# Patient Record
Sex: Female | Born: 1965 | Race: White | Hispanic: No | Marital: Married | Smoking: Never smoker
Health system: Southern US, Community
[De-identification: ages and names within clinical notes are randomized; demographics above are authoritative.]

## PROBLEM LIST (undated history)

## (undated) DIAGNOSIS — E039 Hypothyroidism, unspecified: Secondary | ICD-10-CM

## (undated) DIAGNOSIS — J189 Pneumonia, unspecified organism: Secondary | ICD-10-CM

## (undated) DIAGNOSIS — I471 Supraventricular tachycardia, unspecified: Secondary | ICD-10-CM

## (undated) DIAGNOSIS — E079 Disorder of thyroid, unspecified: Secondary | ICD-10-CM

## (undated) DIAGNOSIS — T8859XA Other complications of anesthesia, initial encounter: Secondary | ICD-10-CM

## (undated) DIAGNOSIS — R000 Tachycardia, unspecified: Secondary | ICD-10-CM

## (undated) HISTORY — DX: Disorder of thyroid, unspecified: E07.9

## (undated) HISTORY — DX: Tachycardia, unspecified: R00.0

---

## 2014-06-17 ENCOUNTER — Encounter: Payer: Self-pay | Admitting: Family Medicine

## 2015-08-23 HISTORY — PX: BUNIONECTOMY: SHX129

## 2017-08-22 HISTORY — PX: VULVA SURGERY: SHX837

## 2018-09-06 DIAGNOSIS — L700 Acne vulgaris: Secondary | ICD-10-CM | POA: Diagnosis not present

## 2018-09-14 DIAGNOSIS — T3695XA Adverse effect of unspecified systemic antibiotic, initial encounter: Secondary | ICD-10-CM | POA: Diagnosis not present

## 2018-09-16 DIAGNOSIS — L270 Generalized skin eruption due to drugs and medicaments taken internally: Secondary | ICD-10-CM | POA: Diagnosis not present

## 2018-09-16 DIAGNOSIS — T360X5A Adverse effect of penicillins, initial encounter: Secondary | ICD-10-CM | POA: Diagnosis not present

## 2018-09-16 DIAGNOSIS — R5383 Other fatigue: Secondary | ICD-10-CM | POA: Diagnosis not present

## 2018-09-16 DIAGNOSIS — R110 Nausea: Secondary | ICD-10-CM | POA: Diagnosis not present

## 2018-11-01 DIAGNOSIS — Z01419 Encounter for gynecological examination (general) (routine) without abnormal findings: Secondary | ICD-10-CM | POA: Diagnosis not present

## 2018-11-01 DIAGNOSIS — N941 Unspecified dyspareunia: Secondary | ICD-10-CM | POA: Diagnosis not present

## 2018-11-01 DIAGNOSIS — N951 Menopausal and female climacteric states: Secondary | ICD-10-CM | POA: Diagnosis not present

## 2018-11-01 DIAGNOSIS — Z6824 Body mass index (BMI) 24.0-24.9, adult: Secondary | ICD-10-CM | POA: Diagnosis not present

## 2018-11-01 DIAGNOSIS — Z1231 Encounter for screening mammogram for malignant neoplasm of breast: Secondary | ICD-10-CM | POA: Diagnosis not present

## 2019-01-07 DIAGNOSIS — Q141 Congenital malformation of retina: Secondary | ICD-10-CM | POA: Diagnosis not present

## 2019-01-07 DIAGNOSIS — H2513 Age-related nuclear cataract, bilateral: Secondary | ICD-10-CM | POA: Diagnosis not present

## 2019-01-07 DIAGNOSIS — H43811 Vitreous degeneration, right eye: Secondary | ICD-10-CM | POA: Diagnosis not present

## 2019-01-07 DIAGNOSIS — H43391 Other vitreous opacities, right eye: Secondary | ICD-10-CM | POA: Diagnosis not present

## 2019-01-09 ENCOUNTER — Encounter: Payer: Self-pay | Admitting: Family Medicine

## 2019-01-09 ENCOUNTER — Ambulatory Visit (INDEPENDENT_AMBULATORY_CARE_PROVIDER_SITE_OTHER): Payer: BLUE CROSS/BLUE SHIELD | Admitting: Family Medicine

## 2019-01-09 ENCOUNTER — Ambulatory Visit (INDEPENDENT_AMBULATORY_CARE_PROVIDER_SITE_OTHER): Payer: BLUE CROSS/BLUE SHIELD

## 2019-01-09 ENCOUNTER — Other Ambulatory Visit: Payer: Self-pay

## 2019-01-09 VITALS — BP 122/74 | HR 73 | Temp 98.2°F | Ht 68.0 in | Wt 160.6 lb

## 2019-01-09 DIAGNOSIS — R0789 Other chest pain: Secondary | ICD-10-CM

## 2019-01-09 DIAGNOSIS — Z87412 Personal history of vulvar dysplasia: Secondary | ICD-10-CM | POA: Insufficient documentation

## 2019-01-09 DIAGNOSIS — E039 Hypothyroidism, unspecified: Secondary | ICD-10-CM

## 2019-01-09 DIAGNOSIS — Z114 Encounter for screening for human immunodeficiency virus [HIV]: Secondary | ICD-10-CM

## 2019-01-09 DIAGNOSIS — M533 Sacrococcygeal disorders, not elsewhere classified: Secondary | ICD-10-CM

## 2019-01-09 DIAGNOSIS — R079 Chest pain, unspecified: Secondary | ICD-10-CM | POA: Diagnosis not present

## 2019-01-09 DIAGNOSIS — Z1159 Encounter for screening for other viral diseases: Secondary | ICD-10-CM | POA: Diagnosis not present

## 2019-01-09 LAB — CBC WITH DIFFERENTIAL/PLATELET
Basophils Absolute: 0.1 K/uL (ref 0.0–0.1)
Basophils Relative: 1.2 % (ref 0.0–3.0)
Eosinophils Absolute: 0.1 K/uL (ref 0.0–0.7)
Eosinophils Relative: 1.2 % (ref 0.0–5.0)
HCT: 38.2 % (ref 36.0–46.0)
Hemoglobin: 13.4 g/dL (ref 12.0–15.0)
Lymphocytes Relative: 31.1 % (ref 12.0–46.0)
Lymphs Abs: 1.7 K/uL (ref 0.7–4.0)
MCHC: 35.1 g/dL (ref 30.0–36.0)
MCV: 92 fl (ref 78.0–100.0)
Monocytes Absolute: 0.5 K/uL (ref 0.1–1.0)
Monocytes Relative: 8.3 % (ref 3.0–12.0)
Neutro Abs: 3.2 K/uL (ref 1.4–7.7)
Neutrophils Relative %: 58.2 % (ref 43.0–77.0)
Platelets: 248 K/uL (ref 150.0–400.0)
RBC: 4.15 Mil/uL (ref 3.87–5.11)
RDW: 12.2 % (ref 11.5–15.5)
WBC: 5.6 K/uL (ref 4.0–10.5)

## 2019-01-09 LAB — LIPID PANEL
Cholesterol: 178 mg/dL (ref 0–200)
HDL: 82.2 mg/dL
LDL Cholesterol: 83 mg/dL (ref 0–99)
NonHDL: 95.31
Total CHOL/HDL Ratio: 2
Triglycerides: 62 mg/dL (ref 0.0–149.0)
VLDL: 12.4 mg/dL (ref 0.0–40.0)

## 2019-01-09 LAB — COMPREHENSIVE METABOLIC PANEL WITH GFR
ALT: 11 U/L (ref 0–35)
AST: 14 U/L (ref 0–37)
Albumin: 4.5 g/dL (ref 3.5–5.2)
Alkaline Phosphatase: 60 U/L (ref 39–117)
BUN: 16 mg/dL (ref 6–23)
CO2: 27 meq/L (ref 19–32)
Calcium: 8.9 mg/dL (ref 8.4–10.5)
Chloride: 105 meq/L (ref 96–112)
Creatinine, Ser: 0.82 mg/dL (ref 0.40–1.20)
GFR: 72.34 mL/min
Glucose, Bld: 87 mg/dL (ref 70–99)
Potassium: 3.9 meq/L (ref 3.5–5.1)
Sodium: 140 meq/L (ref 135–145)
Total Bilirubin: 0.5 mg/dL (ref 0.2–1.2)
Total Protein: 6.7 g/dL (ref 6.0–8.3)

## 2019-01-09 LAB — TSH: TSH: 0.24 u[IU]/mL — ABNORMAL LOW (ref 0.35–4.50)

## 2019-01-09 MED ORDER — PREDNISONE 20 MG PO TABS: 40.0000 mg | ORAL_TABLET | Freq: Every day | ORAL | 0 refills | Status: DC

## 2019-01-09 MED ORDER — CYCLOBENZAPRINE HCL 10 MG PO TABS: 10.0000 mg | ORAL_TABLET | Freq: Three times a day (TID) | ORAL | 0 refills | Status: DC | PRN

## 2019-01-09 NOTE — Progress Notes (Signed)
Patient: Melanie Li MRN: [id number] DOB: 1966/05/02 PCP: Orland Mustard, MD     Subjective:  Chief Complaint  Patient presents with  . Establish Care  . Back Pain  . Chest Pain    HPI: The patient is a 53 y.o. female who presents today for establishing care and to address some issues.   Chest pain: She states it has been going on x 1 month that is right around her heart. Pain located under her left breast.  Sometimes it will be worse at times. Turning on her left side in bed makes it worse. Sitting here in the chair she states the pain will get more intense then subside. No increased pain with exertion. Pain is constant, just severity waxes and wanes. She has no diaphoresis, pain down left arm or shortness of breath. No family history of heart disease. She has no diabetes, HTN, high cholesterol and has never smoked. She has done no over the counter medication to help the pain in her left chest. Pain rated as a 6/10 and described as dull and stabbing. No radiation to her back. She exercises and walks, lifts weights. She can't think of any precipitating factors, may have increased amount of free weights.   Back pain: she has had issues for a number of years with back pain. She states it seems to have flared up since doing yard work on Saturday. Pain is in her lower back. She has had sciatica in years past, but does not have this right now. Pain is all along her lower back. She also got a new bed and is not sure if this is contributing. She did take aleve and it did help some. Pain rated as a 8/10 and is dull, but at times can get stabbing depending on what she is doing. Standing makes it worse and she couldn't lay in bed. No radiation and no numbness, weakness or tingling in her legs. She has done heating pad and stretches with some relief. She has had several MRIs on her back in the past. She has stenosis, herniated discs, and arthritis. Her back feels better when she leans forward. Her last  MRI was 2-3 years ago. Over the years she has done everything short of surgery including PT/injections.    Review of Systems  Constitutional: Negative for chills, fatigue, fever and unexpected weight change.  HENT: Negative.  Negative for dental problem, ear pain, hearing loss and trouble swallowing.   Eyes: Positive for visual disturbance.       Followed by her eye doctor for floaters in right eye  Respiratory: Negative for cough, chest tightness and shortness of breath.   Cardiovascular: Positive for chest pain. Negative for palpitations and leg swelling.  Gastrointestinal: Negative for abdominal pain, blood in stool, diarrhea and nausea.  Endocrine: Negative for cold intolerance, polydipsia, polyphagia and polyuria.  Genitourinary: Negative for dysuria, flank pain, frequency, hematuria, urgency and vaginal pain.  Musculoskeletal: Positive for back pain. Negative for arthralgias, myalgias and neck pain.  Skin: Negative for rash.  Neurological: Negative for dizziness and headaches.  Psychiatric/Behavioral: Positive for sleep disturbance. Negative for dysphoric mood. The patient is not nervous/anxious.     Allergies Patient is allergic to sulfamethoxazole.  Past Medical History Patient  has a past medical history of Thyroid disease.  Surgical History Patient  has a past surgical history that includes Vulva surgery (2019) and Bunionectomy (Left, 2017).  Family History Pateint's family history is not on file.  Social History Patient  reports  that she has never smoked. She has never used smokeless tobacco.    Objective: Vitals:   01/09/19 0940  BP: 122/74  Pulse: 73  Temp: 98.2 F (36.8 C)  TempSrc: Oral  SpO2: 98%  Weight: 160 lb 9.6 oz (72.8 kg)  Height: 5\' 8"  (1.727 m)    Body mass index is 24.42 kg/m.  Physical Exam Vitals signs reviewed.  Constitutional:      Appearance: She is well-developed.  HENT:     Head: Normocephalic and atraumatic.     Right Ear:  External ear normal.     Left Ear: External ear normal.  Eyes:     Extraocular Movements: Extraocular movements intact.     Conjunctiva/sclera: Conjunctivae normal.     Pupils: Pupils are equal, round, and reactive to light.  Neck:     Musculoskeletal: Normal range of motion and neck supple.     Thyroid: No thyromegaly.  Cardiovascular:     Rate and Rhythm: Normal rate and regular rhythm.     Heart sounds: Normal heart sounds. No murmur.  Pulmonary:     Effort: Pulmonary effort is normal.     Breath sounds: Normal breath sounds.  Abdominal:     General: Bowel sounds are normal. There is no distension.     Palpations: Abdomen is soft.     Tenderness: There is no abdominal tenderness.  Musculoskeletal: Normal range of motion.     Comments: +TTP over bilateral SI joints and L5 area. Negative straight leg tests bilaterally.  No reproducible pain over chest wall. Pain underneath left breast.   Lymphadenopathy:     Cervical: No cervical adenopathy.  Skin:    General: Skin is warm and dry.     Capillary Refill: Capillary refill takes less than 2 seconds.     Findings: No rash.  Neurological:     General: No focal deficit present.     Mental Status: She is alert and oriented to person, place, and time.     Cranial Nerves: No cranial nerve deficit.     Coordination: Coordination normal.     Deep Tendon Reflexes: Reflexes normal.     Comments: Strength intact in lower extremities bilaterally. DTR normal 2+, sensation intact.   Psychiatric:        Behavior: Behavior normal.      ekg: nsr with rate of 62. No t wave abnormalities.   [facility] 01/09/2019  Decreased Interest 0  Down, Depressed, Hopeless 0  PHQ - 2 Score 0     Assessment/plan: 1. Atypical chest pain Does not appear cardiac in nature at all. EKG and history reassuring. Low risk as far as cardiac risk factors. Will check labs and discussed differential including musculoskeletal like costochondritis,  subluxated rib. Will do trial of NSAIDS and refer to sports medicine for possible manipulation. Discussed if she feels like this is not getting better or is anxious, we can go ahead and order a stress test for her since Im going on maternity leave. She will let me know within next week. Precautions given for pain with exertion, down arm, substernal chest pain, etc. To go to ER.  - CBC with Differential/Platelet - Comprehensive metabolic panel - Lipid panel - DG Chest 2 View; Future - EKG 12-Lead - Ambulatory referral to Sports Medicine  2. Sacro-iliac pain Appears to be sacro-ilitis and not her typical back pain. She thinks she just over did it in the garden. Will do trial of exercises, heating pad,  NSAIDS and muscle relaxer. Referring to sports med for possible subluxated ribs, so they can address this as well if not better. I did tell her if doesn't get better, we may need to go head and do another MRI with known chronic back issues. If any radicular symptoms present, she will need mri for that as well.  - Ambulatory referral to Sports Medicine  3. Encounter for hepatitis C screening test for low risk patient  - Hepatitis C antibody  4. Encounter for screening for HIV  - HIV Antibody (routine testing w rflx)  5. Hypothyroidism (acquired)  - TSH  Return if symptoms worsen or fail to improve.   Orland Mustard, MD [facility]   01/09/2019

## 2019-01-09 NOTE — Progress Notes (Deleted)
Patient: Melanie Li MRN: [id number] DOB: 29-Oct-1965 PCP: Orland Mustard, MD     Subjective:  No chief complaint on file.   HPI: The patient is a 53 y.o. female who presents today for annual exam. {He/she (caps):30048} denies any changes to past medical history. There have been no recent hospitalizations. They {Actions; are/are not:16769} following a well balanced diet and exercise plan. Weight has been {trend:16658}. No complaints today.    There is no immunization history on file for this patient. Colonoscopy: Mammogram:  Pap smear:  PSA:   Review of Systems  Allergies Patient has no allergies on file.  Past Medical History Patient  has no past medical history on file.  Surgical History Patient  has no past surgical history on file.  Family History Pateint's family history is not on file.  Social History Patient      Objective: There were no vitals filed for this visit.  There is no height or weight on file to calculate BMI.  Physical Exam     Assessment/plan:   No problem-specific Assessment & Plan notes found for this encounter.    No follow-ups on file.     Orland Mustard, MD [facility]  01/09/2019

## 2019-01-09 NOTE — Patient Instructions (Addendum)
-  atypical chest pain: appears to be more musculoskeletal in nature, not cardiac. Trial of anti-inflammatories prn (take with food), heating pad. Will also do referral to sports med to make sure no subluxated rib. If pain is substernal, crushing, down arm, worse with exertion please go to ER.   -back pain:    More pain in your sacro iliac joints. I think you just have some inflammation, but some good info/stretches.  Motrin BID as needed with food. Can take 600-800mg .  Muscle relaxer prn, but may make you drowsy -steroids to start if not getting better. Try to hold off since don't want to suppress your immune system with covid.  Heating pad Stretches Referral to sports med for above and can treat this as well if needed.  -any numbness/tingling need MRI    ?

## 2019-01-10 LAB — HIV ANTIBODY (ROUTINE TESTING W REFLEX): HIV 1&2 Ab, 4th Generation: NONREACTIVE

## 2019-01-10 LAB — HEPATITIS C ANTIBODY
Hepatitis C Ab: NONREACTIVE
SIGNAL TO CUT-OFF: 0.01 (ref <1.00)

## 2019-01-15 ENCOUNTER — Telehealth: Payer: Self-pay | Admitting: Family Medicine

## 2019-01-15 NOTE — Telephone Encounter (Signed)
See result note.  

## 2019-01-15 NOTE — Telephone Encounter (Signed)
Copied from CRM [id number]. Topic: General - Other >> Jan 15, 2019  8:58 AM Melanie Li A wrote: Reason for CRM: Pt called requesting her lab results from a call she received on Friday from [facility]. Pt is requesting to be called back on her cell instead of her home #. Please advise.

## 2019-01-22 DIAGNOSIS — E039 Hypothyroidism, unspecified: Secondary | ICD-10-CM | POA: Diagnosis not present

## 2019-01-24 ENCOUNTER — Encounter: Payer: Self-pay | Admitting: Family Medicine

## 2019-01-24 ENCOUNTER — Telehealth: Payer: Self-pay | Admitting: Family Medicine

## 2019-01-24 DIAGNOSIS — L920 Granuloma annulare: Secondary | ICD-10-CM | POA: Insufficient documentation

## 2019-01-24 DIAGNOSIS — Z8639 Personal history of other endocrine, nutritional and metabolic disease: Secondary | ICD-10-CM | POA: Insufficient documentation

## 2019-01-24 DIAGNOSIS — L719 Rosacea, unspecified: Secondary | ICD-10-CM | POA: Insufficient documentation

## 2019-01-24 NOTE — Telephone Encounter (Signed)
Let her know I received some of her records, but no colonoscopy. If she can call the GI that this was done at in 2015 and send that over that would be great!  Thanks,  Orland Mustard, MD [facility]

## 2019-01-25 ENCOUNTER — Ambulatory Visit: Payer: BLUE CROSS/BLUE SHIELD | Admitting: Family Medicine

## 2019-01-28 NOTE — Telephone Encounter (Signed)
Called home # but there was no answer.  No voicemail, so I was unable to leave message.  Will try back later.

## 2019-01-29 NOTE — Telephone Encounter (Signed)
Spoke to patient and she states that she will try and track down previous GI doctor (where she previously lived in [place]) and have them fax over report for her last colonoscopy.

## 2019-03-26 DIAGNOSIS — E039 Hypothyroidism, unspecified: Secondary | ICD-10-CM | POA: Diagnosis not present

## 2019-04-24 ENCOUNTER — Other Ambulatory Visit: Payer: Self-pay

## 2019-04-24 ENCOUNTER — Encounter: Payer: Self-pay | Admitting: Family Medicine

## 2019-04-24 ENCOUNTER — Ambulatory Visit (INDEPENDENT_AMBULATORY_CARE_PROVIDER_SITE_OTHER): Payer: BC Managed Care – PPO | Admitting: Family Medicine

## 2019-04-24 VITALS — BP 118/74 | HR 74 | Temp 98.6°F | Ht 68.0 in | Wt 163.6 lb

## 2019-04-24 DIAGNOSIS — K645 Perianal venous thrombosis: Secondary | ICD-10-CM

## 2019-04-24 MED ORDER — LIDOCAINE-HYDROCORT (PERIANAL) 3-0.5 % EX CREA: 1.0000 "application " | TOPICAL_CREAM | Freq: Two times a day (BID) | CUTANEOUS | 0 refills | Status: DC

## 2019-04-24 NOTE — Patient Instructions (Addendum)
Start colace (docusate sodium) once to twice daily It will slowly reabsorb.  Sent in topical cream for pain. Can use twice a day and no longer than a week    Hemorrhoids Hemorrhoids are swollen veins that may develop:  In the butt (rectum). These are called internal hemorrhoids.  Around the opening of the butt (anus). These are called external hemorrhoids. Hemorrhoids can cause pain, itching, or bleeding. Most of the time, they do not cause serious problems. They usually get better with diet changes, lifestyle changes, and other home treatments. What are the causes? This condition may be caused by:  Having trouble pooping (constipation).  Pushing hard (straining) to poop.  Watery poop (diarrhea).  Pregnancy.  Being very overweight (obese).  Sitting for long periods of time.  Heavy lifting or other activity that causes you to strain.  Anal sex.  Riding a bike for a long period of time. What are the signs or symptoms? Symptoms of this condition include:  Pain.  Itching or soreness in the butt.  Bleeding from the butt.  Leaking poop.  Swelling in the area.  One or more lumps around the opening of your butt. How is this diagnosed? A doctor can often diagnose this condition by looking at the affected area. The doctor may also:  Do an exam that involves feeling the area with a gloved hand (digital rectal exam).  Examine the area inside your butt using a small tube (anoscope).  Order blood tests. This may be done if you have lost a lot of blood.  Have you get a test that involves looking inside the colon using a flexible tube with a camera on the end (sigmoidoscopy or colonoscopy). How is this treated? This condition can usually be treated at home. Your doctor may tell you to change what you eat, make lifestyle changes, or try home treatments. If these do not help, procedures can be done to remove the hemorrhoids or make them smaller. These may involve:  Placing  rubber bands at the base of the hemorrhoids to cut off their blood supply.  Injecting medicine into the hemorrhoids to shrink them.  Shining a type of light energy onto the hemorrhoids to cause them to fall off.  Doing surgery to remove the hemorrhoids or cut off their blood supply. Follow these instructions at home: Eating and drinking   Eat foods that have a lot of fiber in them. These include whole grains, beans, nuts, fruits, and vegetables.  Ask your doctor about taking products that have added fiber (fibersupplements).  Reduce the amount of fat in your diet. You can do this by: ? Eating low-fat dairy products. ? Eating less red meat. ? Avoiding processed foods.  Drink enough fluid to keep your pee (urine) pale yellow. Managing pain and swelling   Take a warm-water bath (sitz bath) for 20 minutes to ease pain. Do this 3-4 times a day. You may do this in a bathtub or using a portable sitz bath that fits over the toilet.  If told, put ice on the painful area. It may be helpful to use ice between your warm baths. ? Put ice in a plastic bag. ? Place a towel between your skin and the bag. ? Leave the ice on for 20 minutes, 2-3 times a day. General instructions  Take over-the-counter and prescription medicines only as told by your doctor. ? Medicated creams and medicines may be used as told.  Exercise often. Ask your doctor how much and what  kind of exercise is best for you.  Go to the bathroom when you have the urge to poop. Do not wait.  Avoid pushing too hard when you poop.  Keep your butt dry and clean. Use wet toilet paper or moist towelettes after pooping.  Do not sit on the toilet for a long time.  Keep all follow-up visits as told by your doctor. This is important. Contact a doctor if you:  Have pain and swelling that do not get better with treatment or medicine.  Have trouble pooping.  Cannot poop.  Have pain or swelling outside the area of the  hemorrhoids. Get help right away if you have:  Bleeding that will not stop. Summary  Hemorrhoids are swollen veins in the butt or around the opening of the butt.  They can cause pain, itching, or bleeding.  Eat foods that have a lot of fiber in them. These include whole grains, beans, nuts, fruits, and vegetables.  Take a warm-water bath (sitz bath) for 20 minutes to ease pain. Do this 3-4 times a day. This information is not intended to replace advice given to you by your health care provider. Make sure you discuss any questions you have with your health care provider. Document Released: 05/17/2008 Document Revised: 08/16/2018 Document Reviewed: 12/28/2017 Elsevier Patient Education  2020 [employer].

## 2019-04-24 NOTE — Progress Notes (Signed)
Patient: Melanie Li MRN: [id number] DOB: 1966-03-01 PCP: Orma Flaming, MD     Subjective:  Chief Complaint  Patient presents with  . bump in rectal area    HPI: The patient is a 53 y.o. female who presents today for bump in rectal area. She noticed this about 1.5 weeks ago. It was initially very painful, but pain has improved. She was having a hard time having a BM. Yesterday she took a Geologist, engineering and looked and saw a big bump. She has never had a hemorrhoid so isn't sure what it looks like. No blood in stool. Only painful with BM. Pain is much better and is rated a 1/10. UTD on colonoscopy.   Review of Systems  Constitutional: Negative for chills, fatigue and fever.  HENT: Negative for congestion, rhinorrhea, sinus pain and sore throat.   Eyes: Negative for visual disturbance.  Respiratory: Negative for cough, chest tightness and shortness of breath.   Cardiovascular: Negative for chest pain, palpitations and leg swelling.  Gastrointestinal: Positive for constipation and rectal pain. Negative for abdominal pain, anal bleeding, blood in stool, diarrhea, nausea and vomiting.  Musculoskeletal: Negative for arthralgias, back pain, myalgias and neck pain.  Skin: Negative for rash.  Neurological: Positive for headaches. Negative for dizziness.  Psychiatric/Behavioral: Positive for sleep disturbance.    Allergies Patient is allergic to sulfamethoxazole.  Past Medical History Patient  has a past medical history of Thyroid disease.  Surgical History Patient  has a past surgical history that includes Vulva surgery (2019) and Bunionectomy (Left, 2017).  Family History Pateint's family history includes Asthma in her daughter; Cancer in her maternal grandfather, mother, and paternal grandfather; Diabetes in her paternal grandmother; Hearing loss in her paternal grandfather; Hypertension in her mother; Stroke in her maternal grandmother.  Social History Patient  reports that she has  never smoked. She has never used smokeless tobacco. She reports that she does not use drugs.    Objective: Vitals:   04/24/19 1020  BP: 118/74  Pulse: 74  Temp: 98.6 F (37 C)  TempSrc: Skin  SpO2: 98%  Weight: 163 lb 9.6 oz (74.2 kg)  Height: 5\' 8"  (1.727 m)    Body mass index is 24.88 kg/m.  Physical Exam Vitals signs reviewed.  Constitutional:      Appearance: Normal appearance.  Genitourinary:    Comments: Thrombosed hemorrhoid at 6:00pm, otherwise normal exam.  Neurological:     Mental Status: She is alert.        Assessment/plan: 1. Thrombosed hemorrhoids Pain has resolved and is now a 1/10. Discussed since not as painful do not need to excise and it will slowly reabsorb. Giving her lidocaine/hydrocortisone cream to use bid x 1 week for pain and recommended stool softner. Handout given to help prevent and help hemorrhoids.  Let me know if not getting better or worsening symptoms.    Return if symptoms worsen or fail to improve.   Orma Flaming, MD [place]   04/24/2019

## 2019-05-07 DIAGNOSIS — N951 Menopausal and female climacteric states: Secondary | ICD-10-CM | POA: Diagnosis not present

## 2019-05-07 DIAGNOSIS — N903 Dysplasia of vulva, unspecified: Secondary | ICD-10-CM | POA: Diagnosis not present

## 2019-05-07 DIAGNOSIS — N941 Unspecified dyspareunia: Secondary | ICD-10-CM | POA: Diagnosis not present

## 2019-09-02 ENCOUNTER — Ambulatory Visit (INDEPENDENT_AMBULATORY_CARE_PROVIDER_SITE_OTHER): Payer: BC Managed Care – PPO

## 2019-09-02 ENCOUNTER — Other Ambulatory Visit: Payer: Self-pay

## 2019-09-02 ENCOUNTER — Encounter: Payer: Self-pay | Admitting: Family Medicine

## 2019-09-02 ENCOUNTER — Ambulatory Visit (INDEPENDENT_AMBULATORY_CARE_PROVIDER_SITE_OTHER): Payer: BC Managed Care – PPO | Admitting: Family Medicine

## 2019-09-02 VITALS — BP 117/88 | HR 85 | Temp 97.8°F | Ht 68.0 in | Wt 166.4 lb

## 2019-09-02 DIAGNOSIS — G8929 Other chronic pain: Secondary | ICD-10-CM

## 2019-09-02 DIAGNOSIS — M25512 Pain in left shoulder: Secondary | ICD-10-CM | POA: Diagnosis not present

## 2019-09-02 DIAGNOSIS — M25511 Pain in right shoulder: Secondary | ICD-10-CM

## 2019-09-02 DIAGNOSIS — E039 Hypothyroidism, unspecified: Secondary | ICD-10-CM | POA: Diagnosis not present

## 2019-09-02 DIAGNOSIS — M2550 Pain in unspecified joint: Secondary | ICD-10-CM

## 2019-09-02 DIAGNOSIS — Z8639 Personal history of other endocrine, nutritional and metabolic disease: Secondary | ICD-10-CM | POA: Diagnosis not present

## 2019-09-02 LAB — CBC WITH DIFFERENTIAL/PLATELET
Basophils Absolute: 0.1 10*3/uL (ref 0.0–0.1)
Basophils Relative: 0.9 % (ref 0.0–3.0)
Eosinophils Absolute: 0.1 10*3/uL (ref 0.0–0.7)
Eosinophils Relative: 1.3 % (ref 0.0–5.0)
HCT: 38.4 % (ref 36.0–46.0)
Hemoglobin: 13.3 g/dL (ref 12.0–15.0)
Lymphocytes Relative: 32.4 % (ref 12.0–46.0)
Lymphs Abs: 2 10*3/uL (ref 0.7–4.0)
MCHC: 34.6 g/dL (ref 30.0–36.0)
MCV: 90 fl (ref 78.0–100.0)
Monocytes Absolute: 0.5 10*3/uL (ref 0.1–1.0)
Monocytes Relative: 7.8 % (ref 3.0–12.0)
Neutro Abs: 3.6 10*3/uL (ref 1.4–7.7)
Neutrophils Relative %: 57.6 % (ref 43.0–77.0)
Platelets: 258 10*3/uL (ref 150.0–400.0)
RBC: 4.27 Mil/uL (ref 3.87–5.11)
RDW: 12.3 % (ref 11.5–15.5)
WBC: 6.3 10*3/uL (ref 4.0–10.5)

## 2019-09-02 LAB — COMPREHENSIVE METABOLIC PANEL
ALT: 12 U/L (ref 0–35)
AST: 15 U/L (ref 0–37)
Albumin: 4.6 g/dL (ref 3.5–5.2)
Alkaline Phosphatase: 72 U/L (ref 39–117)
BUN: 23 mg/dL (ref 6–23)
CO2: 27 mEq/L (ref 19–32)
Calcium: 9.4 mg/dL (ref 8.4–10.5)
Chloride: 106 mEq/L (ref 96–112)
Creatinine, Ser: 0.83 mg/dL (ref 0.40–1.20)
GFR: 71.17 mL/min (ref >60.00)
Glucose, Bld: 89 mg/dL (ref 70–99)
Potassium: 4.2 mEq/L (ref 3.5–5.1)
Sodium: 141 mEq/L (ref 135–145)
Total Bilirubin: 0.5 mg/dL (ref 0.2–1.2)
Total Protein: 7 g/dL (ref 6.0–8.3)

## 2019-09-02 LAB — VITAMIN D 25 HYDROXY (VIT D DEFICIENCY, FRACTURES): VITD: 36.32 ng/mL (ref 30.00–100.00)

## 2019-09-02 LAB — C-REACTIVE PROTEIN: CRP: <1 mg/dL (ref 0.5–20.0)

## 2019-09-02 LAB — T4, FREE: Free T4: 1.31 ng/dL (ref 0.60–1.60)

## 2019-09-02 LAB — SEDIMENTATION RATE: Sed Rate: 4 mm/hr (ref 0–30)

## 2019-09-02 LAB — TSH: TSH: 0.54 u[IU]/mL (ref 0.35–4.50)

## 2019-09-02 MED ORDER — CYCLOBENZAPRINE HCL 10 MG PO TABS: 10.0000 mg | ORAL_TABLET | Freq: Three times a day (TID) | ORAL | 0 refills | Status: DC | PRN

## 2019-09-02 MED ORDER — IBUPROFEN 600 MG PO TABS: 600.0000 mg | ORAL_TABLET | Freq: Three times a day (TID) | ORAL | 0 refills | Status: DC | PRN

## 2019-09-02 NOTE — Progress Notes (Signed)
Patient: Melanie Li MRN: [id number] DOB: [birth year] PCP: Orland Mustard, MD     Subjective:  Chief Complaint  Patient presents with  . Shoulder Pain    b/l shoulder pain x 2 mos    HPI: The patient is a 54 y.o. female who presents today for b/l shoulder pain x 2 months.  Sleeping on either side causes more discomfort. She notices it the minute she lays down she has pain in her shoulders that sends pain down both of her arms. She has pain in both of her arms right now. She notices if she lifts things it feels like someone I spunching her in her arms. Lying on the shoulders make it worse. It makes her not be able to sleep. She has taken aleve and tylenol with no help. She has tried biofreeze as well. She has noticed weakness in her arms, but it's harder for her to open jars or lift hair dryer. No other joint pain anywhere else. Just at her shoulders. Pain is rated as an 8/10 at night if she is on her shoulders. It doesn't tend to bother her if she lays on her back. Pain described as stabbing at it's worse and dull the rest of the times. No trauma prior to this. Got a new mattress back in April. No new exercise or medication. She did have her thyroid medication changed and has not had it rechecked. Elbow also are sore bilaterally. She is right handed. Achy in muscles. Played softball in highschool. She has full ROM in her shoulders.   She also feels like her hands are hurting in their joints and seem more swollen. Worse in the AM. No fh of RA.   Review of Systems  Constitutional: Negative for fatigue.  Respiratory: Negative for shortness of breath.   Cardiovascular: Negative for chest pain.  Gastrointestinal: Negative for abdominal pain and nausea.  Musculoskeletal: Positive for arthralgias, back pain and myalgias. Negative for joint swelling, neck pain and neck stiffness.       Pain from b/l shoulder radiates into upper back, down in between shoulder blades.  Neurological: Positive for  weakness. Negative for numbness and headaches.       C/o muscle weakness in b/l arms, trouble with lifting things.  Sometimes she wakes up with numbness/tingling in b/l arms  Psychiatric/Behavioral: Positive for sleep disturbance.    Allergies Patient is allergic to sulfamethoxazole.  Past Medical History Patient  has a past medical history of Thyroid disease.  Surgical History Patient  has a past surgical history that includes Vulva surgery (2019) and Bunionectomy (Left, 2017).  Family History Pateint's family history includes Asthma in her daughter; Cancer in her maternal grandfather, mother, and paternal grandfather; Diabetes in her paternal grandmother; Hearing loss in her paternal grandfather; Hypertension in her mother; Stroke in her maternal grandmother.  Social History Patient  reports that she has never smoked. She has never used smokeless tobacco. She reports that she does not use drugs.    Objective: Vitals:   09/02/19 1307  BP: 117/88  Pulse: 85  Temp: 97.8 F (36.6 C)  TempSrc: Temporal  SpO2: 95%  Weight: 166 lb 6.4 oz (75.5 kg)  Height: 5\' 8"  (1.727 m)    Body mass index is 25.3 kg/m.  Physical Exam Vitals reviewed.  Constitutional:      Appearance: Normal appearance.  HENT:     Head: Normocephalic and atraumatic.  Musculoskeletal:        General: No swelling. Normal range of motion.  Comments: Bilateral hands with herbeden nodes. Good hand grip.  Bilateral shoulders TTP over lateral humeral head and proximal lateral aspect of humerus. TTP over bilateral medial epicondyles. Negative neers/passive arc/speeds test and negative gerber test. Full Rom in bilateral shoulders. Strength intact bilateral arms.   Skin:    Findings: No erythema or rash.  Neurological:     General: No focal deficit present.     Mental Status: She is alert and oriented to person, place, and time.  Psychiatric:        Mood and Affect: Mood normal.        Behavior: Behavior  normal.    Depression screen [facility] 2/9 09/02/2019 01/09/2019  Decreased Interest 0 0  Down, Depressed, Hopeless 0 0  PHQ - 2 Score 0 0   Bilateral shoulder xray: wnl, no acute findings, but official read pending.      Assessment/plan: 1. Chronic pain of both shoulders ? If OA vs. Fibromyalgia vs. RA vs shoulder impingement.  Checking labs. Conservative treatment with nsaids, muscle relaxer and exercise until we get back her labs/work up.  - DG Shoulder Left; Future - DG Shoulder Right; Future  2. Arthralgia, unspecified joint R/o RA, thyroid, other chronic issues. Checking labs. Want her to start motrin prn and will wait on lab work up. ? If fibromyalgia as well. handout given on this and instructed her to read over since more of a clinical diagnosis.  - CBC with Differential/Platelet - Comprehensive metabolic panel - C-reactive protein - Sedimentation rate - ANA w/reflex - Rheumatoid factor  3. Hypothyroidism (acquired)  - TSH - T4, free  4. H/O vitamin D deficiency  - VITAMIN D 25 Hydroxy (Vit-D Deficiency, Fractures)     This visit occurred during the SARS-CoV-2 public health emergency.  Safety protocols were in place, including screening questions prior to the visit, additional usage of staff PPE, and extensive cleaning of exam room while observing appropriate contact time as indicated for disinfecting solutions.     Return if symptoms worsen or fail to improve.   Orma Flaming, MD [place]   09/02/2019

## 2019-09-02 NOTE — Patient Instructions (Addendum)
Ill send in motrin for you  To take as needed with foot. You can take up to three times a day as needed for pain  Will trial a muscle relaxer for you to take as needed. It's called flexeril. Can take up to three times a day but causes you to be very drowsy. I would start off with 1/2 a tab at night to see how drowsy you get.    Look up fibromylagia in the meantime.    Myofascial Pain Syndrome and Fibromyalgia Myofascial pain syndrome and fibromyalgia are both pain disorders. This pain may be felt mainly in your muscles.  Myofascial pain syndrome: ? Always has tender points in the muscle that will cause pain when pressed (trigger points). The pain may come and go. ? Usually affects your neck, upper back, and shoulder areas. The pain often radiates into your arms and hands.  Fibromyalgia: ? Has muscle pains and tenderness that come and go. ? Is often associated with fatigue and sleep problems. ? Has trigger points. ? Tends to be long-lasting (chronic), but is not life-threatening. Fibromyalgia and myofascial pain syndrome are not the same. However, they often occur together. If you have both conditions, each can make the other worse. Both are common and can cause enough pain and fatigue to make day-to-day activities difficult. Both can be hard to diagnose because their symptoms are common in many other conditions. What are the causes? The exact causes of these conditions are not known. What increases the risk? You are more likely to develop this condition if:  You have a family history of the condition.  You have certain triggers, such as: ? Spine disorders. ? An injury (trauma) or other physical stressors. ? Being under a lot of stress. ? Medical conditions such as osteoarthritis, rheumatoid arthritis, or lupus. What are the signs or symptoms? Fibromyalgia The main symptom of fibromyalgia is widespread pain and tenderness in your muscles. Pain is sometimes described as stabbing,  shooting, or burning. You may also have:  Tingling or numbness.  Sleep problems and fatigue.  Problems with attention and concentration (fibro fog). Other symptoms may include:  Bowel and bladder problems.  Headaches.  Visual problems.  Problems with odors and noises.  Depression or mood changes.  Painful menstrual periods (dysmenorrhea).  Dry skin or eyes. These symptoms can vary over time. Myofascial pain syndrome Symptoms of myofascial pain syndrome include:  Tight, ropy bands of muscle.  Uncomfortable sensations in muscle areas. These may include aching, cramping, burning, numbness, tingling, and weakness.  Difficulty moving certain parts of the body freely (poor range of motion). How is this diagnosed? This condition may be diagnosed by your symptoms and medical history. You will also have a physical exam. In general:  Fibromyalgia is diagnosed if you have pain, fatigue, and other symptoms for more than 3 months, and symptoms cannot be explained by another condition.  Myofascial pain syndrome is diagnosed if you have trigger points in your muscles, and those trigger points are tender and cause pain elsewhere in your body (referred pain). How is this treated? Treatment for these conditions depends on the type that you have.  For fibromyalgia: ? Pain medicines, such as NSAIDs. ? Medicines for treating depression. ? Medicines for treating seizures. ? Medicines that relax the muscles.  For myofascial pain: ? Pain medicines, such as NSAIDs. ? Cooling and stretching of muscles. ? Trigger point injections. ? Sound wave (ultrasound) treatments to stimulate muscles. Treating these conditions often requires a  team of health care providers. These may include:  Your primary care provider.  Physical therapist.  Complementary health care providers, such as massage therapists or acupuncturists.  Psychiatrist for cognitive behavioral therapy. Follow these  instructions at home: Medicines  Take over-the-counter and prescription medicines only as told by your health care provider.  Do not drive or use heavy machinery while taking prescription pain medicine.  If you are taking prescription pain medicine, take actions to prevent or treat constipation. Your health care provider may recommend that you: ? Drink enough fluid to keep your urine pale yellow. ? Eat foods that are high in fiber, such as fresh fruits and vegetables, whole grains, and beans. ? Limit foods that are high in fat and processed sugars, such as fried or sweet foods. ? Take an over-the-counter or prescription medicine for constipation. Lifestyle   Exercise as directed by your health care provider or physical therapist.  Practice relaxation techniques to control your stress. You may want to try: ? Biofeedback. ? Visual imagery. ? Hypnosis. ? Muscle relaxation. ? Yoga. ? Meditation.  Maintain a healthy lifestyle. This includes eating a healthy diet and getting enough sleep.  Do not use any products that contain nicotine or tobacco, such as cigarettes and e-cigarettes. If you need help quitting, ask your health care provider. General instructions  Talk to your health care provider about complementary treatments, such as acupuncture or massage.  Consider joining a support group with others who are diagnosed with this condition.  Do not do activities that stress or strain your muscles. This includes repetitive motions and heavy lifting.  Keep all follow-up visits as told by your health care provider. This is important. Where to find more information  National Fibromyalgia Association: www.fmaware.[place]: www.arthritis.org  American Chronic Pain Association: www.theacpa.org Contact a health care provider if:  You have new symptoms.  Your symptoms get worse or your pain is severe.  You have side effects from your medicines.  You have trouble  sleeping.  Your condition is causing depression or anxiety. Summary  Myofascial pain syndrome and fibromyalgia are pain disorders.  Myofascial pain syndrome has tender points in the muscle that will cause pain when pressed (trigger points). Fibromyalgia also has muscle pains and tenderness that come and go, but this condition is often associated with fatigue and sleep disturbances.  Fibromyalgia and myofascial pain syndrome are not the same but often occur together, causing pain and fatigue that make day-to-day activities difficult.  Treatment for fibromyalgia includes taking medicines to relax the muscles and medicines for pain, depression, or seizures. Treatment for myofascial pain syndrome includes taking medicines for pain, cooling and stretching of muscles, and injecting medicines into trigger points.  Follow your health care provider's instructions for taking medicines and maintaining a healthy lifestyle. This information is not intended to replace advice given to you by your health care provider. Make sure you discuss any questions you have with your health care provider. Document Revised: 11/30/2018 Document Reviewed: 08/23/2017 Elsevier Patient Education  2020 [employer].

## 2019-09-04 ENCOUNTER — Other Ambulatory Visit: Payer: Self-pay | Admitting: Family Medicine

## 2019-09-04 DIAGNOSIS — R768 Other specified abnormal immunological findings in serum: Secondary | ICD-10-CM

## 2019-09-04 DIAGNOSIS — M2550 Pain in unspecified joint: Secondary | ICD-10-CM

## 2019-09-04 LAB — RHEUMATOID FACTOR: Rheumatoid fact SerPl-aCnc: <14 IU/mL (ref <14)

## 2019-09-04 LAB — ANA: Anti Nuclear Antibody (ANA): POSITIVE — AB

## 2019-09-04 LAB — ANTI-NUCLEAR AB-TITER (ANA TITER): ANA Titer 1: 1:40 {titer} — ABNORMAL HIGH

## 2019-09-13 ENCOUNTER — Encounter: Payer: Self-pay | Admitting: Family Medicine

## 2019-10-03 ENCOUNTER — Encounter: Payer: Self-pay | Admitting: Family Medicine

## 2019-10-04 ENCOUNTER — Other Ambulatory Visit: Payer: Self-pay | Admitting: Family Medicine

## 2019-10-04 MED ORDER — CYCLOBENZAPRINE HCL 10 MG PO TABS: 10.0000 mg | ORAL_TABLET | Freq: Three times a day (TID) | ORAL | 1 refills | Status: DC | PRN

## 2019-10-04 MED ORDER — IBUPROFEN 600 MG PO TABS: 600.0000 mg | ORAL_TABLET | Freq: Three times a day (TID) | ORAL | 1 refills | Status: DC | PRN

## 2019-10-04 NOTE — Telephone Encounter (Signed)
Please Advise

## 2019-10-22 NOTE — Progress Notes (Signed)
Office Visit Note  Patient: Melanie Li             Date of Birth: 02-Jul-1966           MRN: [id number]             PCP: Orma Flaming, MD Referring: Orma Flaming, MD Visit Date: 10/28/2019 Occupation: '@[email or web address]' @  Subjective:  Joint pain and positive ANA   History of Present Illness: Melanie Li is a 54 y.o. female in consultation per request of her PCP for evaluation of positive ANA.  According to patient for the last 4 to 5 months she has been having pain in her both shoulders.  She states the pain is worse when she is laying on her side.  Her left side has been more painful.  She has been also experiencing pain over bilateral medial epicondyle area which she describes over her elbows.  She has discomfort and pain in her bilateral hands and has difficulty gripping objects.  She states 1-1/2-year ago she started having lower extremity pain which lasted for about 6 months and then resolved.  Her PCP has prescribed ibuprofen and Flexeril which she takes on as needed basis.  She is gravida 1 para 3 1 miscarriages 0.  There is no history of any rash.  There is positive family history of osteoarthritis in her father.  She is very active and does weight training and plays golf.  Activities of Daily Living:  Patient reports morning stiffness for 0 none.   Patient Reports nocturnal pain.  Difficulty dressing/grooming: Denies Difficulty climbing stairs: Denies Difficulty getting out of chair: Denies Difficulty using hands for taps, buttons, cutlery, and/or writing: Reports  Review of Systems  Constitutional: Negative for fatigue, night sweats, weight gain and weight loss.  HENT: Negative for mouth sores, trouble swallowing, trouble swallowing, mouth dryness and nose dryness.   Eyes: Positive for dryness. Negative for pain, redness and visual disturbance.  Respiratory: Negative for cough, shortness of breath and difficulty breathing.   Cardiovascular: Negative for chest pain,  palpitations, hypertension, irregular heartbeat and swelling in legs/feet.  Gastrointestinal: Negative for blood in stool, constipation and diarrhea.  Endocrine: Positive for heat intolerance. Negative for increased urination.  Genitourinary: Negative for difficulty urinating and vaginal dryness.  Musculoskeletal: Positive for arthralgias, joint pain and muscle tenderness. Negative for gait problem, joint swelling, myalgias, muscle weakness, morning stiffness and myalgias.  Skin: Negative for color change, rash, hair loss, skin tightness, ulcers and sensitivity to sunlight.  Allergic/Immunologic: Negative for susceptible to infections.  Neurological: Negative for dizziness, numbness, memory loss, night sweats and weakness.  Hematological: Negative for bruising/bleeding tendency and swollen glands.  Psychiatric/Behavioral: Positive for sleep disturbance. Negative for depressed mood. The patient is not nervous/anxious.     PMFS History:  Patient Active Problem List   Diagnosis Date Noted  . 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 Rosacea 01/24/2019  . H/O vitamin D deficiency 01/24/2019  . Hypothyroidism (acquired) 01/09/2019  . 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 Thyroid disease     Family History  Problem Relation Age of Onset  . Cancer Mother   . Hypertension Mother   . Asthma Daughter   . Stroke Maternal Grandmother   . Cancer Maternal Grandfather   . Diabetes Paternal Grandmother   . Cancer Paternal Grandfather   . Hearing loss Paternal Grandfather    Past Surgical History:  Procedure Laterality Date  . BUNIONECTOMY Left 2017  also had R bunionectomy in 1984 approx  . VULVA SURGERY  2019   Social History   Social History Narrative  . Not on file    There is no immunization history on file for this patient.   Objective: Vital Signs: BP 115/71 (BP Location: Right Arm, Patient Position: Sitting, Cuff Size: Normal)   Pulse 84    Resp 14   Ht 5' 7.75" (1.721 m)   Wt 169 lb 6.4 oz (76.8 kg)   LMP  (LMP Unknown)   BMI 25.95 kg/m    Physical Exam Vitals and nursing note reviewed.  Constitutional:      Appearance: She is well-developed.  HENT:     Head: Normocephalic and atraumatic.  Eyes:     Conjunctiva/sclera: Conjunctivae normal.  Cardiovascular:     Rate and Rhythm: Normal rate and regular rhythm.     Heart sounds: Normal heart sounds.  Pulmonary:     Effort: Pulmonary effort is normal.     Breath sounds: Normal breath sounds.  Abdominal:     General: Bowel sounds are normal.     Palpations: Abdomen is soft.  Musculoskeletal:     Cervical back: Normal range of motion.  Lymphadenopathy:     Cervical: No cervical adenopathy.  Skin:    General: Skin is warm and dry.     Capillary Refill: Capillary refill takes less than 2 seconds.  Neurological:     Mental Status: She is alert and oriented to person, place, and time.  Psychiatric:        Behavior: Behavior normal.      Musculoskeletal Exam: C-spine thoracic and lumbar spine were in good range of motion.  She had no SI joint tenderness.  Shoulder joints have good range of motion with some discomfort.  She has tenderness on palpation of bilateral medial epicondyle area.  She has bilateral PIP and DIP thickening.  No MCP or intercarpal joint tenderness or swelling was noted.  Hip joints, knee joints, ankles with good range of motion.  She has had bilateral bunionectomy and some osteoarthritic changes in her feet.  CDAI Exam: CDAI Score: -- Patient Global: --; Provider Global: -- Swollen: --; Tender: -- Joint Exam 10/28/2019   No joint exam has been documented for this visit   There is currently no information documented on the homunculus. Go to the Rheumatology activity and complete the homunculus joint exam.  Investigation: Findings:  09/02/19: ANA 1:40 NS, TSH 0.54, T4 1.31, CRP<1, ESR 4, RF<14, Vitamin D 36.32   Imaging: XR Hand 2 View  Left  Result Date: 10/28/2019 CMC, PIP and DIP narrowing was noted.  No MCP, intercarpal or radiocarpal joint space narrowing was noted.  No erosive changes were noted. Impression: These findings are consistent with osteoarthritis of the hand.  XR Hand 2 View Right  Result Date: 10/28/2019 CMC, PIP and DIP narrowing was noted.  No MCP, intercarpal or radiocarpal joint space narrowing was noted.  No erosive changes were noted. Impression: These findings are consistent with osteoarthritis of the hand.   Recent Labs: Lab Results  Component Value Date   WBC 6.3 09/02/2019   HGB 13.3 09/02/2019   PLT 258.0 09/02/2019   NA 141 09/02/2019   K 4.2 09/02/2019   CL 106 09/02/2019   CO2 27 09/02/2019   GLUCOSE 89 09/02/2019   BUN 23 09/02/2019   CREATININE 0.83 09/02/2019   BILITOT 0.5 09/02/2019   ALKPHOS 72 09/02/2019   AST 15 09/02/2019  ALT 12 09/02/2019   PROT 7.0 09/02/2019   ALBUMIN 4.6 09/02/2019   CALCIUM 9.4 09/02/2019    Speciality Comments: No specialty comments available.  Procedures:  No procedures performed Allergies: Sulfamethoxazole   Assessment / Plan:     Visit Diagnoses: Chronic pain of both shoulders-patient complains of pain and discomfort in her bilateral shoulders for the last for 5 months.  I reviewed the x-rays done on her shoulders from September 02, 2019 which showed left shoulder acromioclavicular joint arthritis.  Otherwise shoulder joint x-rays were unremarkable.  I detailed discussion regarding x-ray findings.  I think she will benefit from physical therapy.  She declined physical therapy.  Have given her a handout on shoulder joint exercises.  I also discussed if she has no improvement we can consider doing left AC joint injection.  Medial epicondylitis of both elbows-she complains of discomfort over bilateral medial epicondyle area.  She had mild tenderness on the left side.  I have given her a handout on exercises.  She plays golf and that may be  exacerbating her symptoms.  Modification in her exercise regimen was also discussed.  Pain in both hands -she has bilateral PIP and DIP thickening with no synovitis.  No MCP or intercarpal joint involvement was noted.  These findings are consistent with osteoarthritis.  Plan: XR Hand 2 View Right, XR Hand 2 View Left.  X-ray findings were consistent with osteoarthritis.  A handout on hand exercises was given.  Also given a list of natural anti-inflammatories.  Positive ANA (antinuclear antibody) - 09/02/19: ANA 1:40 NS, TSH 0.54, T4 1.31, CRP<1, ESR 4, RF<14, Vitamin D 36.32.  She has no clinical features of autoimmune disease.  Other medical problems are listed as follows:  Hypothyroidism (acquired)  H/O vitamin D deficiency  Rosacea  Granuloma annulare  H/O vulvar dysplasia  Orders: Orders Placed This Encounter  Procedures  . XR Hand 2 View Right  . XR Hand 2 View Left   No orders of the defined types were placed in this encounter.     Follow-Up Instructions: Return in 3 months (on 01/28/2020) for Osteoarthritis.   Bo Merino, MD  Note - This record has been created using Editor, commissioning.  Chart creation errors have been sought, but may not always  have been located. Such creation errors do not reflect on  the standard of medical care.

## 2019-10-28 ENCOUNTER — Ambulatory Visit: Payer: Self-pay

## 2019-10-28 ENCOUNTER — Ambulatory Visit (INDEPENDENT_AMBULATORY_CARE_PROVIDER_SITE_OTHER): Payer: BC Managed Care – PPO | Admitting: Rheumatology

## 2019-10-28 ENCOUNTER — Other Ambulatory Visit: Payer: Self-pay

## 2019-10-28 ENCOUNTER — Encounter: Payer: Self-pay | Admitting: Rheumatology

## 2019-10-28 VITALS — BP 115/71 | HR 84 | Resp 14 | Ht 67.75 in | Wt 169.4 lb

## 2019-10-28 DIAGNOSIS — L920 Granuloma annulare: Secondary | ICD-10-CM

## 2019-10-28 DIAGNOSIS — M79641 Pain in right hand: Secondary | ICD-10-CM | POA: Diagnosis not present

## 2019-10-28 DIAGNOSIS — R768 Other specified abnormal immunological findings in serum: Secondary | ICD-10-CM

## 2019-10-28 DIAGNOSIS — M25512 Pain in left shoulder: Secondary | ICD-10-CM

## 2019-10-28 DIAGNOSIS — M7702 Medial epicondylitis, left elbow: Secondary | ICD-10-CM

## 2019-10-28 DIAGNOSIS — M25511 Pain in right shoulder: Secondary | ICD-10-CM

## 2019-10-28 DIAGNOSIS — E039 Hypothyroidism, unspecified: Secondary | ICD-10-CM

## 2019-10-28 DIAGNOSIS — G8929 Other chronic pain: Secondary | ICD-10-CM | POA: Diagnosis not present

## 2019-10-28 DIAGNOSIS — R7689 Other specified abnormal immunological findings in serum: Secondary | ICD-10-CM

## 2019-10-28 DIAGNOSIS — L719 Rosacea, unspecified: Secondary | ICD-10-CM

## 2019-10-28 DIAGNOSIS — M7701 Medial epicondylitis, right elbow: Secondary | ICD-10-CM

## 2019-10-28 DIAGNOSIS — Z87412 Personal history of vulvar dysplasia: Secondary | ICD-10-CM

## 2019-10-28 DIAGNOSIS — M79642 Pain in left hand: Secondary | ICD-10-CM

## 2019-10-28 DIAGNOSIS — Z8639 Personal history of other endocrine, nutritional and metabolic disease: Secondary | ICD-10-CM

## 2019-10-28 NOTE — Patient Instructions (Addendum)
Elbow and Forearm Exercises Ask your health care provider which exercises are safe for you. Do exercises exactly as told by your health care provider and adjust them as directed. It is normal to feel mild stretching, pulling, tightness, or discomfort as you do these exercises. Stop right away if you feel sudden pain or your pain gets worse. Do not begin these exercises until told by your health care provider. Range-of-motion exercises These exercises warm up your muscles and joints and improve the movement and flexibility of your injured elbow and forearm. The exercises also help to relieve pain, numbness, and tingling. These exercises are done using the muscles in your injured elbow and forearm (active). Elbow flexion, active 1. Hold your left / right arm at your side, and bend your elbow (flexion) as far as you can using only your arm muscles. 2. Hold this position for __________ seconds. 3. Slowly return to the starting position. Repeat __________ times. Complete this exercise __________ times a day. Elbow extension, active 1. Hold your left / right arm at your side, and straighten your elbow (extension) as much as you can using only your arm muscles. 2. Hold this position for __________ seconds. 3. Slowly return to the starting position. Repeat __________ times. Complete this exercise __________ times a day. Active forearm rotation, supination This is an exercise in which you turn (rotate) your forearm palm up (supination). 1. Stand or sit with your elbows at your sides. 2. Bend your left / right elbow to a 90-degree angle (right angle). 3. Rotate your palm up until you feel a gentle stretch on the inside of your forearm. 4. Hold this position for __________ seconds. 5. Slowly return to the starting position. Repeat __________ times. Complete this exercise __________ times a day. Active forearm rotation, pronation This is an exercise in which you turn (rotate) your forearm palm down  (pronation). 1. Stand or sit with your elbows at your sides. 2. Bend your left / right elbow to a 90-degree angle (right angle). 3. Rotate your palm down until you feel a gentle stretch on the top of your forearm. 4. Hold this position for __________ seconds. 5. Slowly return to the starting position. Repeat __________ times. Complete this exercise __________ times a day. Stretching exercises These exercises warm up your muscles and joints and improve the movement and flexibility of your injured elbow and forearm. These exercises also help to relieve pain, numbness, and tingling. These exercises are done using your healthy elbow and forearm to help stretch the muscles in your injured elbow and forearm (active-assisted). Elbow flexion, active-assisted  1. Hold your left / right arm at your side, and bend your elbow (flexion) as much as you can using your left / right arm muscles. 2. Use your other hand to bend your left / right elbow farther. To do this, gently push up on your forearm until you feel a gentle stretch on the back of your elbow. 3. Hold this position for __________ seconds. 4. Slowly return to the starting position. Repeat __________ times. Complete this exercise __________ times a day. Elbow extension, active-assisted  1. Hold your left / right arm at your side, and straighten your elbow (extension) as much as you can using your left / right arm muscles. 2. Use your other hand to straighten the left / right elbow farther. To do this, gently push down on your forearm until you feel a gentle stretch on the inside of your elbow. 3. Hold this position for __________  seconds. 4. Slowly return to the starting position. Repeat __________ times. Complete this exercise __________ times a day. Active-assisted forearm rotation, supination This is an exercise in which you turn (rotate) your forearm palm up (supination). 1. Sit with your left / right elbow bent in a 90-degree angle (right  angle) with your forearm resting on a table. 2. Keeping your upper body and shoulder still, rotate your forearm so your palm faces upward. 3. Use your other hand to help rotate your forearm further until you feel a gentle to moderate stretch. 4. Hold this position for __________ seconds. 5. Slowly release the stretch and return to the starting position. Repeat __________ times. Complete this exercise __________ times a day. Active-assisted forearm rotation, pronation This is an exercise in which you turn (rotate) your forearm palm down (pronation). 1. Sit with your left / right elbow bent in a 90-degree angle (right angle) with your forearm resting on a table. 2. Keeping your upper body and shoulder still, rotate your forearm so your palm faces the tabletop. 3. Use your other hand to help rotate your forearm further until you feel a gentle to moderate stretch. 4. Hold this position for __________ seconds. 5. Slowly release the stretch and return to the starting position. Repeat __________ times. Complete this exercise __________ times a day. Passive elbow flexion, supine 1. Lie on your back (supine position). 2. Extend your left / right arm up in the air, bracing it with your other hand. 3. Let your left / right hand slowly lower toward your shoulder (passive flexion), while your elbow stays pointed toward the ceiling. You should feel a gentle stretch along the back of your upper arm and elbow. 4. If instructed by your health care provider, you may increase the intensity of your stretch by adding a small wrist weight or hand weight. 5. Hold this position for __________ seconds. 6. Slowly return to the starting position. Repeat __________ times. Complete this exercise __________ times a day. Passive elbow extension, supine  1. Lie on your back (supine position). Make sure that you are in a comfortable position that lets you relax your arm muscles. 2. Place a folded towel under your left /  right upper arm so your elbow and shoulder are at the same height. Straighten your left / right arm so your elbow does not rest on the bed or towel. 3. Let the weight of your hand stretch your elbow (passive extension). Keep your arm and chest muscles relaxed. You should feel a stretch on the inside of your elbow. 4. If told by your health care provider, you may increase the intensity of your stretch by adding a small wrist weight or hand weight. 5. Hold this position for __________ seconds. 6. Slowly release the stretch. Repeat __________ times. Complete this exercise __________ times a day. Strengthening exercises These exercises build strength and endurance in your elbow and forearm. Endurance is the ability to use your muscles for a long time, even after they get tired. Elbow flexion, isometric  1. Stand or sit up straight. 2. Bend your left / right elbow in a 90-degree angle (right angle), and keep your forearm at the height of your waist. Your thumb should be pointed toward the ceiling (neutral forearm). 3. Place your other hand on top of your left / right forearm. Gently push down while you resist with your left / right arm (isometric flexion). Push as hard as you can with both arms without causing any pain or movement   at your left / right elbow. 4. Hold this position for __________ seconds. 5. Slowly release the tension in both arms. Let your muscles relax completely before you repeat the exercise. Repeat __________ times. Complete this exercise __________ times a day. Elbow extension, isometric  1. Stand or sit up straight. 2. Place your left / right arm so your palm faces your abdomen and is at the height of your waist. 3. Place your other hand on the underside of your left / right forearm. Gently push up while you resist with your left / right arm (isometric extension). Push as hard as you can with both arms without causing any pain or movement at your left / right elbow. 4. Hold this  position for __________ seconds. 5. Slowly release the tension in both arms. Let your muscles relax completely before you repeat the exercise. Repeat __________ times. Complete this exercise __________ times a day. Elbow flexion with forearm palm up  1. Sit on a firm chair without armrests, or stand up. 2. Place your left / right arm at your side with your elbow straight and your palm facing forward. 3. Holding a __________weight or gripping a rubber exercise band or tubing, bend your elbow to bring your hand toward your shoulder (flexion). 4. Hold this position for __________ seconds. 5. Slowly return to the starting position. Repeat __________ times. Complete this exercise __________ times a day. Elbow extension, active  1. Sit on a firm chair without armrests, or stand up. 2. Hold a rubber exercise band or tubing in both hands. 3. Keeping your upper arms at your sides, bring both hands up to your left / right shoulder. Keep your left / right hand just below your other hand. 4. Straighten your left / right elbow (extension) while keeping your other arm still. 5. Hold this position for __________ seconds. 6. Control the resistance of the band or tubing as you return to the starting position. Repeat __________ times. Complete this exercise __________ times a day. Forearm rotation, supination  1. Sit with your left / right forearm supported on a table. Your elbow should be at waist height and bent at a 90-degree angle (right angle). 2. Gently grasp a lightweight hammer. 3. Rest your hand over the edge of the table with your palm facing down. 4. Without moving your left / right elbow, slowly rotate your forearm to turn your palm up toward the ceiling (supination). 5. Hold this position for __________ seconds. 6. Slowly return to the starting position. Repeat __________ times. Complete this exercise __________ times a day. Forearm rotation, pronation  1. Sit with your left / right forearm  supported on a table. Keep your elbow below shoulder height. 2. Gently grasp a lightweight hammer. 3. Rest your hand over the edge of the table with your palm facing up. 4. Without moving your left / right elbow, slowly rotate your forearm to turn your palm down toward the floor (pronation). 5. Hold this position for __________seconds. 6. Slowly return to the starting position. Repeat __________ times. Complete this exercise __________ times a day. This information is not intended to replace advice given to you by your health care provider. Make sure you discuss any questions you have with your health care provider. Document Revised: 11/29/2018 Document Reviewed: 08/29/2018 [employer] Patient Education  2020 [employer] Shoulder Exercises Ask your health care provider which exercises are safe for you. Do exercises exactly as told by your health care provider and adjust them as directed. It is  normal to feel mild stretching, pulling, tightness, or discomfort as you do these exercises. Stop right away if you feel sudden pain or your pain gets worse. Do not begin these exercises until told by your health care provider. Stretching exercises External rotation and abduction This exercise is sometimes called corner stretch. This exercise rotates your arm outward (external rotation) and moves your arm out from your body (abduction). 4. Stand in a doorway with one of your feet slightly in front of the other. This is called a staggered stance. If you cannot reach your forearms to the door frame, stand facing a corner of a room. 5. Choose one of the following positions as told by your health care provider: ? Place your hands and forearms on the door frame above your head. ? Place your hands and forearms on the door frame at the height of your head. ? Place your hands on the door frame at the height of your elbows. 6. Slowly move your weight onto your front foot until you feel a stretch across your chest  and in the front of your shoulders. Keep your head and chest upright and keep your abdominal muscles tight. 7. Hold for __________ seconds. 8. To release the stretch, shift your weight to your back foot. Repeat __________ times. Complete this exercise __________ times a day. Extension, standing 4. Stand and hold a broomstick, a cane, or a similar object behind your back. ? Your hands should be a little wider than shoulder width apart. ? Your palms should face away from your back. 5. Keeping your elbows straight and your shoulder muscles relaxed, move the stick away from your body until you feel a stretch in your shoulders (extension). ? Avoid shrugging your shoulders while you move the stick. Keep your shoulder blades tucked down toward the middle of your back. 6. Hold for __________ seconds. 7. Slowly return to the starting position. Repeat __________ times. Complete this exercise __________ times a day. Range-of-motion exercises Pendulum  6. Stand near a wall or a surface that you can hold onto for balance. 7. Bend at the waist and let your left / right arm hang straight down. Use your other arm to support you. Keep your back straight and do not lock your knees. 8. Relax your left / right arm and shoulder muscles, and move your hips and your trunk so your left / right arm swings freely. Your arm should swing because of the motion of your body, not because you are using your arm or shoulder muscles. 9. Keep moving your hips and trunk so your arm swings in the following directions, as told by your health care provider: ? Side to side. ? Forward and backward. ? In clockwise and counterclockwise circles. 10. Continue each motion for __________ seconds, or for as long as told by your health care provider. 11. Slowly return to the starting position. Repeat __________ times. Complete this exercise __________ times a day. Shoulder flexion, standing  6. Stand and hold a broomstick, a cane, or a  similar object. Place your hands a little more than shoulder width apart on the object. Your left / right hand should be palm up, and your other hand should be palm down. 7. Keep your elbow straight and your shoulder muscles relaxed. Push the stick up with your healthy arm to raise your left / right arm in front of your body, and then over your head until you feel a stretch in your shoulder (flexion). ? Avoid shrugging your  shoulder while you raise your arm. Keep your shoulder blade tucked down toward the middle of your back. 8. Hold for __________ seconds. 9. Slowly return to the starting position. Repeat __________ times. Complete this exercise __________ times a day. Shoulder abduction, standing 5. Stand and hold a broomstick, a cane, or a similar object. Place your hands a little more than shoulder width apart on the object. Your left / right hand should be palm up, and your other hand should be palm down. 6. Keep your elbow straight and your shoulder muscles relaxed. Push the object across your body toward your left / right side. Raise your left / right arm to the side of your body (abduction) until you feel a stretch in your shoulder. ? Do not raise your arm above shoulder height unless your health care provider tells you to do that. ? If directed, raise your arm over your head. ? Avoid shrugging your shoulder while you raise your arm. Keep your shoulder blade tucked down toward the middle of your back. 7. Hold for __________ seconds. 8. Slowly return to the starting position. Repeat __________ times. Complete this exercise __________ times a day. Internal rotation  5. Place your left / right hand behind your back, palm up. 6. Use your other hand to dangle an exercise band, a towel, or a similar object over your shoulder. Grasp the band with your left / right hand so you are holding on to both ends. 7. Gently pull up on the band until you feel a stretch in the front of your left / right  shoulder. The movement of your arm toward the center of your body is called internal rotation. ? Avoid shrugging your shoulder while you raise your arm. Keep your shoulder blade tucked down toward the middle of your back. 8. Hold for __________ seconds. 9. Release the stretch by letting go of the band and lowering your hands. Repeat __________ times. Complete this exercise __________ times a day. Strengthening exercises External rotation  6. Sit in a stable chair without armrests. 7. Secure an exercise band to a stable object at elbow height on your left / right side. 8. Place a soft object, such as a folded towel or a small pillow, between your left / right upper arm and your body to move your elbow about 4 inches (10 cm) away from your side. 9. Hold the end of the exercise band so it is tight and there is no slack. 10. Keeping your elbow pressed against the soft object, slowly move your forearm out, away from your abdomen (external rotation). Keep your body steady so only your forearm moves. 11. Hold for __________ seconds. 12. Slowly return to the starting position. Repeat __________ times. Complete this exercise __________ times a day. Shoulder abduction  6. Sit in a stable chair without armrests, or stand up. 7. Hold a __________ weight in your left / right hand, or hold an exercise band with both hands. 8. Start with your arms straight down and your left / right palm facing in, toward your body. 9. Slowly lift your left / right hand out to your side (abduction). Do not lift your hand above shoulder height unless your health care provider tells you that this is safe. ? Keep your arms straight. ? Avoid shrugging your shoulder while you do this movement. Keep your shoulder blade tucked down toward the middle of your back. 10. Hold for __________ seconds. 11. Slowly lower your arm, and return to the starting  position. Repeat __________ times. Complete this exercise __________ times a  day. Shoulder extension 7. Sit in a stable chair without armrests, or stand up. 8. Secure an exercise band to a stable object in front of you so it is at shoulder height. 9. Hold one end of the exercise band in each hand. Your palms should face each other. 10. Straighten your elbows and lift your hands up to shoulder height. 11. Step back, away from the secured end of the exercise band, until the band is tight and there is no slack. 12. Squeeze your shoulder blades together as you pull your hands down to the sides of your thighs (extension). Stop when your hands are straight down by your sides. Do not let your hands go behind your body. 13. Hold for __________ seconds. 14. Slowly return to the starting position. Repeat __________ times. Complete this exercise __________ times a day. Shoulder row 7. Sit in a stable chair without armrests, or stand up. 8. Secure an exercise band to a stable object in front of you so it is at waist height. 9. Hold one end of the exercise band in each hand. Position your palms so that your thumbs are facing the ceiling (neutral position). 10. Bend each of your elbows to a 90-degree angle (right angle) and keep your upper arms at your sides. 11. Step back until the band is tight and there is no slack. 12. Slowly pull your elbows back behind you. 13. Hold for __________ seconds. 14. Slowly return to the starting position. Repeat __________ times. Complete this exercise __________ times a day. Shoulder press-ups  6. Sit in a stable chair that has armrests. Sit upright, with your feet flat on the floor. 7. Put your hands on the armrests so your elbows are bent and your fingers are pointing forward. Your hands should be about even with the sides of your body. 8. Push down on the armrests and use your arms to lift yourself off the chair. Straighten your elbows and lift yourself up as much as you comfortably can. ? Move your shoulder blades down, and avoid letting  your shoulders move up toward your ears. ? Keep your feet on the ground. As you get stronger, your feet should support less of your body weight as you lift yourself up. 9. Hold for __________ seconds. 10. Slowly lower yourself back into the chair. Repeat __________ times. Complete this exercise __________ times a day. Wall push-ups  6. Stand so you are facing a stable wall. Your feet should be about one arm-length away from the wall. 7. Lean forward and place your palms on the wall at shoulder height. 8. Keep your feet flat on the floor as you bend your elbows and lean forward toward the wall. 9. Hold for __________ seconds. 10. Straighten your elbows to push yourself back to the starting position. Repeat __________ times. Complete this exercise __________ times a day. This information is not intended to replace advice given to you by your health care provider. Make sure you discuss any questions you have with your health care provider. Document Revised: 11/30/2018 Document Reviewed: 09/07/2018 [employer] Patient Education  2020 [employer] Hand Exercises Hand exercises can be helpful for almost anyone. These exercises can strengthen the hands, improve flexibility and movement, and increase blood flow to the hands. These results can make work and daily tasks easier. Hand exercises can be especially helpful for people who have joint pain from arthritis or have nerve damage from overuse (carpal  tunnel syndrome). These exercises can also help people who have injured a hand. Exercises Most of these hand exercises are gentle stretching and motion exercises. It is usually safe to do them often throughout the day. Warming up your hands before exercise may help to reduce stiffness. You can do this with gentle massage or by placing your hands in warm water for 10-15 minutes. It is normal to feel some stretching, pulling, tightness, or mild discomfort as you begin new exercises. This will gradually  improve. Stop an exercise right away if you feel sudden, severe pain or your pain gets worse. Ask your health care provider which exercises are best for you. Knuckle bend or "claw" fist 1. Stand or sit with your arm, hand, and all five fingers pointed straight up. Make sure to keep your wrist straight during the exercise. 2. Gently bend your fingers down toward your palm until the tips of your fingers are touching the top of your palm. Keep your big knuckle straight and just bend the small knuckles in your fingers. 3. Hold this position for __________ seconds. 4. Straighten (extend) your fingers back to the starting position. Repeat this exercise 5-10 times with each hand. Full finger fist 1. Stand or sit with your arm, hand, and all five fingers pointed straight up. Make sure to keep your wrist straight during the exercise. 2. Gently bend your fingers into your palm until the tips of your fingers are touching the middle of your palm. 3. Hold this position for __________ seconds. 4. Extend your fingers back to the starting position, stretching every joint fully. Repeat this exercise 5-10 times with each hand. Straight fist 1. Stand or sit with your arm, hand, and all five fingers pointed straight up. Make sure to keep your wrist straight during the exercise. 2. Gently bend your fingers at the big knuckle, where your fingers meet your hand, and the middle knuckle. Keep the knuckle at the tips of your fingers straight and try to touch the bottom of your palm. 3. Hold this position for __________ seconds. 4. Extend your fingers back to the starting position, stretching every joint fully. Repeat this exercise 5-10 times with each hand. Tabletop 1. Stand or sit with your arm, hand, and all five fingers pointed straight up. Make sure to keep your wrist straight during the exercise. 2. Gently bend your fingers at the big knuckle, where your fingers meet your hand, as far down as you can while keeping  the small knuckles in your fingers straight. Think of forming a tabletop with your fingers. 3. Hold this position for __________ seconds. 4. Extend your fingers back to the starting position, stretching every joint fully. Repeat this exercise 5-10 times with each hand. Finger spread 1. Place your hand flat on a table with your palm facing down. Make sure your wrist stays straight as you do this exercise. 2. Spread your fingers and thumb apart from each other as far as you can until you feel a gentle stretch. Hold this position for __________ seconds. 3. Bring your fingers and thumb tight together again. Hold this position for __________ seconds. Repeat this exercise 5-10 times with each hand. Making circles 1. Stand or sit with your arm, hand, and all five fingers pointed straight up. Make sure to keep your wrist straight during the exercise. 2. Make a circle by touching the tip of your thumb to the tip of your index finger. 3. Hold for __________ seconds. Then open your hand wide. 4.  Repeat this motion with your thumb and each finger on your hand. Repeat this exercise 5-10 times with each hand. Thumb motion 1. Sit with your forearm resting on a table and your wrist straight. Your thumb should be facing up toward the ceiling. Keep your fingers relaxed as you move your thumb. 2. Lift your thumb up as high as you can toward the ceiling. Hold for __________ seconds. 3. Bend your thumb across your palm as far as you can, reaching the tip of your thumb for the small finger (pinkie) side of your palm. Hold for __________ seconds. Repeat this exercise 5-10 times with each hand. Grip strengthening  1. Hold a stress ball or other soft ball in the middle of your hand. 2. Slowly increase the pressure, squeezing the ball as much as you can without causing pain. Think of bringing the tips of your fingers into the middle of your palm. All of your finger joints should bend when doing this exercise. 3. Hold  your squeeze for __________ seconds, then relax. Repeat this exercise 5-10 times with each hand. Contact a health care provider if:  Your hand pain or discomfort gets much worse when you do an exercise.  Your hand pain or discomfort does not improve within 2 hours after you exercise. If you have any of these problems, stop doing these exercises right away. Do not do them again unless your health care provider says that you can. Get help right away if:  You develop sudden, severe hand pain or swelling. If this happens, stop doing these exercises right away. Do not do them again unless your health care provider says that you can. This information is not intended to replace advice given to you by your health care provider. Make sure you discuss any questions you have with your health care provider. Document Revised: 11/29/2018 Document Reviewed: 08/09/2018 Elsevier Patient Education  2020 [employer].

## 2019-11-15 DIAGNOSIS — Z85828 Personal history of other malignant neoplasm of skin: Secondary | ICD-10-CM | POA: Diagnosis not present

## 2019-11-15 DIAGNOSIS — Z08 Encounter for follow-up examination after completed treatment for malignant neoplasm: Secondary | ICD-10-CM | POA: Diagnosis not present

## 2019-11-15 DIAGNOSIS — L700 Acne vulgaris: Secondary | ICD-10-CM | POA: Diagnosis not present

## 2019-11-15 DIAGNOSIS — D225 Melanocytic nevi of trunk: Secondary | ICD-10-CM | POA: Diagnosis not present

## 2019-11-15 DIAGNOSIS — Z1283 Encounter for screening for malignant neoplasm of skin: Secondary | ICD-10-CM | POA: Diagnosis not present

## 2019-11-27 ENCOUNTER — Ambulatory Visit: Payer: BC Managed Care – PPO | Admitting: Rheumatology

## 2019-12-30 DIAGNOSIS — N941 Unspecified dyspareunia: Secondary | ICD-10-CM | POA: Diagnosis not present

## 2019-12-30 DIAGNOSIS — Z6825 Body mass index (BMI) 25.0-25.9, adult: Secondary | ICD-10-CM | POA: Diagnosis not present

## 2019-12-30 DIAGNOSIS — N903 Dysplasia of vulva, unspecified: Secondary | ICD-10-CM | POA: Diagnosis not present

## 2019-12-30 DIAGNOSIS — Z01419 Encounter for gynecological examination (general) (routine) without abnormal findings: Secondary | ICD-10-CM | POA: Diagnosis not present

## 2020-01-23 DIAGNOSIS — E039 Hypothyroidism, unspecified: Secondary | ICD-10-CM | POA: Diagnosis not present

## 2020-01-28 DIAGNOSIS — E039 Hypothyroidism, unspecified: Secondary | ICD-10-CM | POA: Diagnosis not present

## 2020-01-29 ENCOUNTER — Ambulatory Visit: Payer: BC Managed Care – PPO | Admitting: Rheumatology

## 2020-02-05 DIAGNOSIS — Z1231 Encounter for screening mammogram for malignant neoplasm of breast: Secondary | ICD-10-CM | POA: Diagnosis not present

## 2020-07-03 DIAGNOSIS — M25511 Pain in right shoulder: Secondary | ICD-10-CM | POA: Diagnosis not present

## 2020-07-03 DIAGNOSIS — M7542 Impingement syndrome of left shoulder: Secondary | ICD-10-CM | POA: Diagnosis not present

## 2020-07-03 DIAGNOSIS — M25532 Pain in left wrist: Secondary | ICD-10-CM | POA: Diagnosis not present

## 2020-07-31 DIAGNOSIS — H43811 Vitreous degeneration, right eye: Secondary | ICD-10-CM | POA: Diagnosis not present

## 2020-12-10 IMAGING — DX DG SHOULDER 2+V*L*
3 series · 3 of 3 positions shown · non-contrast
Comparison: None.

CLINICAL DATA: Left shoulder pain for 2 months.

EXAM:
LEFT SHOULDER - 2+ VIEW

[shoulder grashey ap]
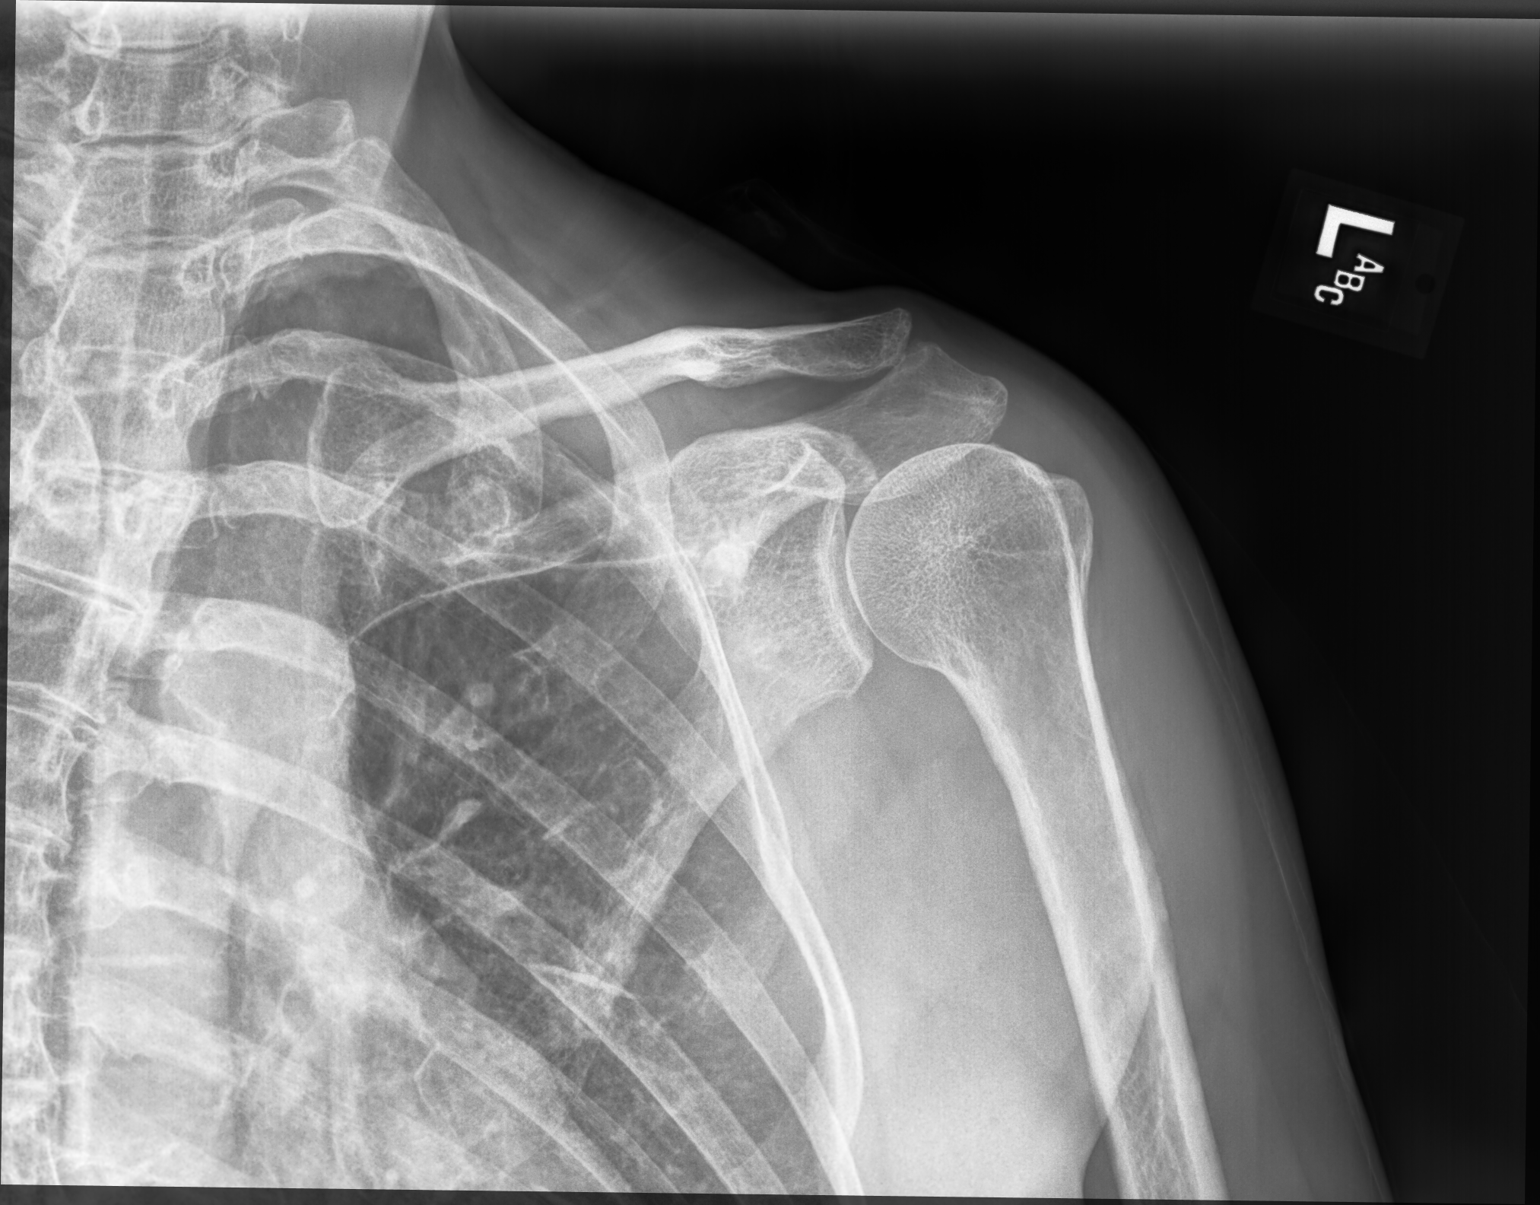

[shoulder y view]
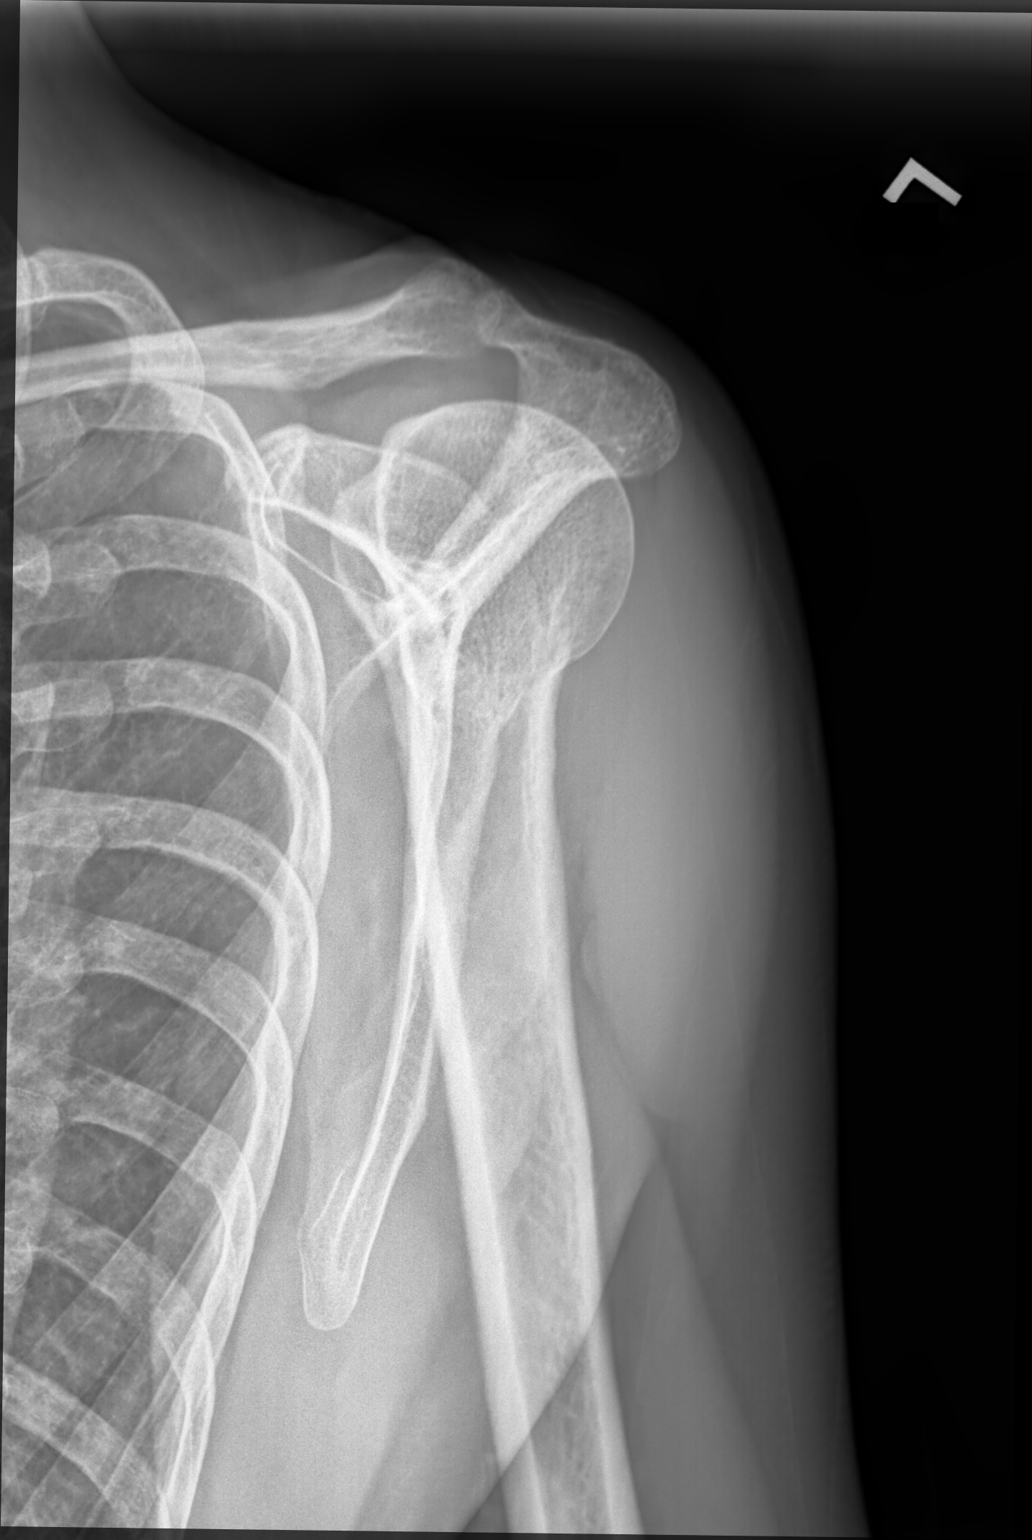

[shoulder axial]
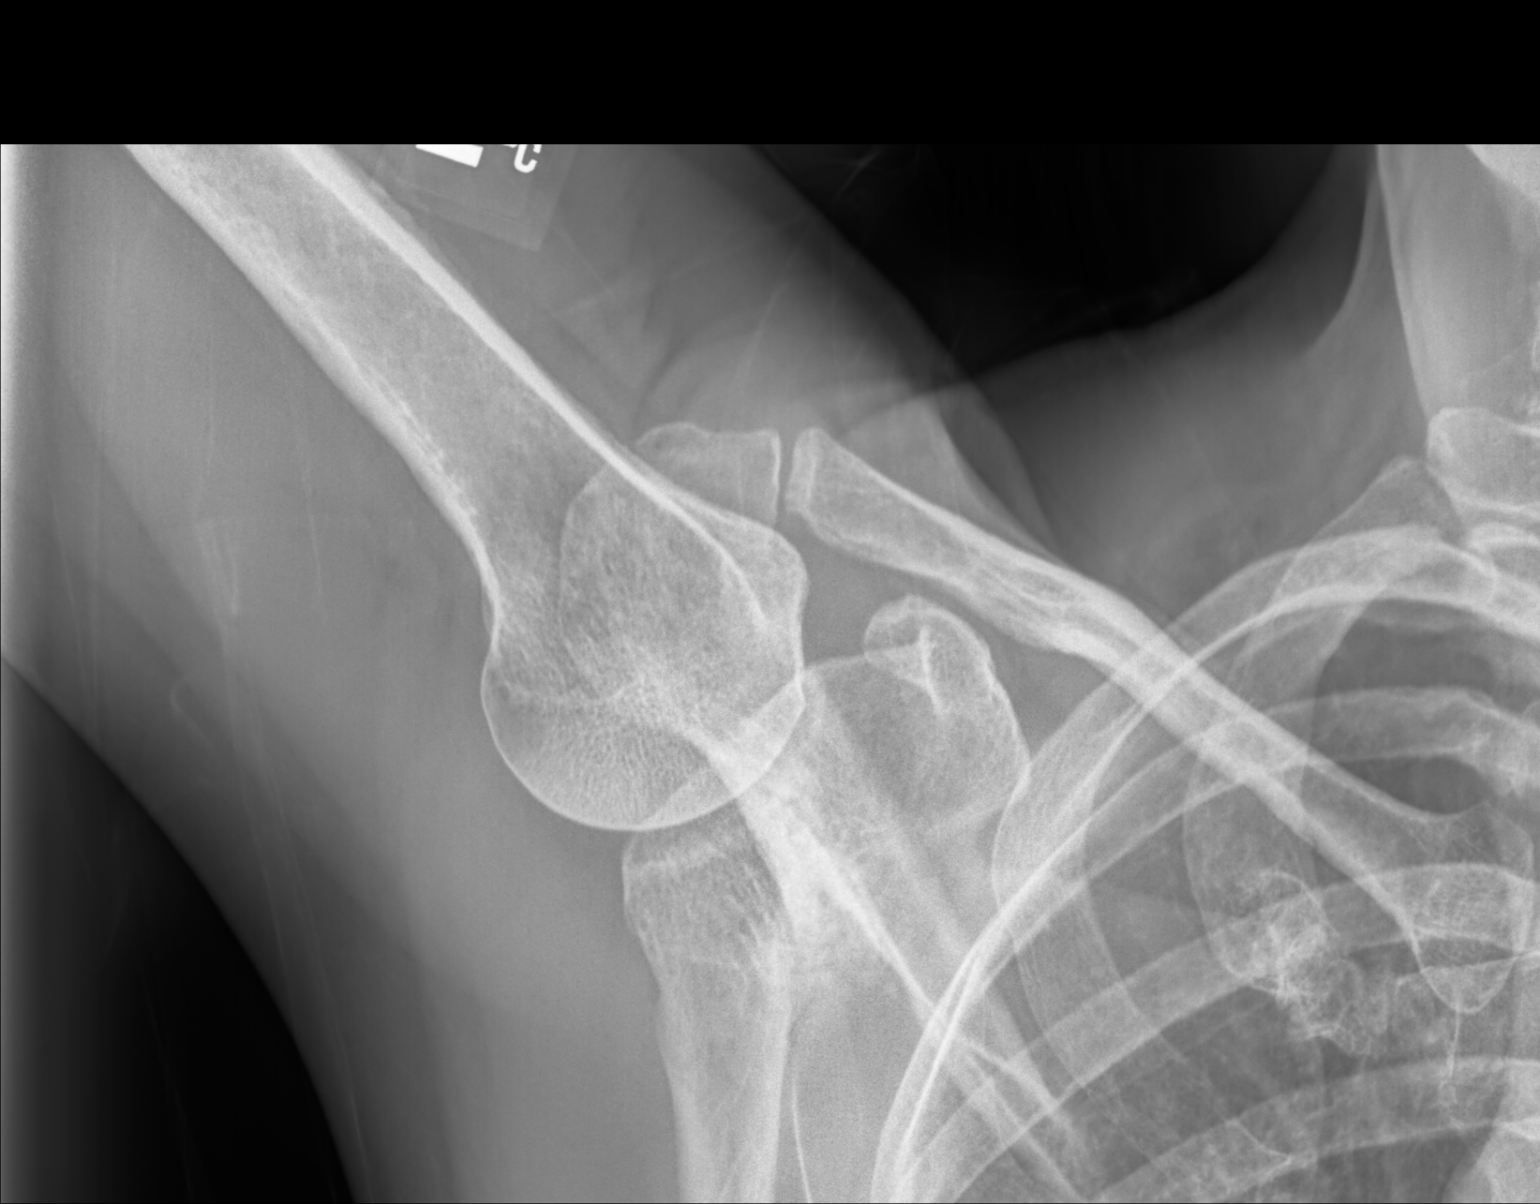

[3 of 3 positions shown; findings below may reference images not displayed]

FINDINGS: There is no evidence of fracture or dislocation. Minimal AC joint
arthropathy. Soft tissues are unremarkable.
IMPRESSION: No significant abnormality of the left shoulder.

## 2021-09-29 ENCOUNTER — Other Ambulatory Visit: Payer: Self-pay

## 2021-09-29 ENCOUNTER — Ambulatory Visit (INDEPENDENT_AMBULATORY_CARE_PROVIDER_SITE_OTHER): Payer: 59 | Admitting: Family Medicine

## 2021-09-29 ENCOUNTER — Encounter: Payer: Self-pay | Admitting: Family Medicine

## 2021-09-29 VITALS — BP 110/80 | HR 68 | Temp 98.1°F | Ht 68.0 in | Wt 161.5 lb

## 2021-09-29 DIAGNOSIS — M542 Cervicalgia: Secondary | ICD-10-CM

## 2021-09-29 DIAGNOSIS — G8929 Other chronic pain: Secondary | ICD-10-CM | POA: Diagnosis not present

## 2021-09-29 DIAGNOSIS — H538 Other visual disturbances: Secondary | ICD-10-CM | POA: Diagnosis not present

## 2021-09-29 DIAGNOSIS — R519 Headache, unspecified: Secondary | ICD-10-CM | POA: Diagnosis not present

## 2021-09-29 LAB — CBC WITH DIFFERENTIAL/PLATELET
Basophils Absolute: 0.1 10*3/uL (ref 0.0–0.1)
Basophils Relative: 1.4 % (ref 0.0–3.0)
Eosinophils Absolute: 0.1 10*3/uL (ref 0.0–0.7)
Eosinophils Relative: 1.6 % (ref 0.0–5.0)
HCT: 39.2 % (ref 36.0–46.0)
Hemoglobin: 13.5 g/dL (ref 12.0–15.0)
Lymphocytes Relative: 31 % (ref 12.0–46.0)
Lymphs Abs: 1.5 10*3/uL (ref 0.7–4.0)
MCHC: 34.4 g/dL (ref 30.0–36.0)
MCV: 89.4 fl (ref 78.0–100.0)
Monocytes Absolute: 0.4 10*3/uL (ref 0.1–1.0)
Monocytes Relative: 8.9 % (ref 3.0–12.0)
Neutro Abs: 2.8 10*3/uL (ref 1.4–7.7)
Neutrophils Relative %: 57.1 % (ref 43.0–77.0)
Platelets: 255 10*3/uL (ref 150.0–400.0)
RBC: 4.39 Mil/uL (ref 3.87–5.11)
RDW: 13.3 % (ref 11.5–15.5)
WBC: 4.9 10*3/uL (ref 4.0–10.5)

## 2021-09-29 LAB — SEDIMENTATION RATE: Sed Rate: 8 mm/hr (ref 0–30)

## 2021-09-29 MED ORDER — CYCLOBENZAPRINE HCL 10 MG PO TABS: 10.0000 mg | ORAL_TABLET | Freq: Every day | ORAL | 3 refills | Status: DC

## 2021-09-29 NOTE — Patient Instructions (Signed)
It was very nice to see you today!  Consider massage.  Get x-ray day of MRI   PLEASE NOTE:  If you had any lab tests please let us know if you have not heard back within a few days. You may see your results on MyChart before we have a chance to review them but we will give you a call once they are reviewed by [place]. If we ordered any referrals today, please let us know if you have not heard from their office within the next week.   Please try these tips to maintain a healthy lifestyle:  Eat most of your calories during the day when you are active. Eliminate processed foods including packaged sweets (pies, cakes, cookies), reduce intake of potatoes, white bread, white pasta, and white rice. Look for whole grain options, oat flour or almond flour.  Each meal should contain half fruits/vegetables, one quarter protein, and one quarter carbs (no bigger than a computer mouse).  Cut down on sweet beverages. This includes juice, soda, and sweet tea. Also watch fruit intake, though this is a healthier sweet option, it still contains natural sugar! Limit to 3 servings daily.  Drink at least 1 glass of water with each meal and aim for at least 8 glasses per day  Exercise at least 150 minutes every week.

## 2021-09-29 NOTE — Progress Notes (Signed)
Subjective:     Patient ID: Melanie Li, female    DOB: 21-Aug-1966, 56 y.o.   MRN: [id number]  Chief Complaint  Patient presents with   Headache   Transfer of Care    Pain in back of head that radiates from left shoulder to the back of the head that started 3 months ago, referral to neurology     HPI HA-3 mo.  Back of head.  Pain L shoulder up back of head and L side.  Vision can get blurry.  Tender to touch L side of head. Near ear.  Occ vertigo.   Was constant HA, but better past 1 wk.  Still painL occiput area/parietal area.  3-4/10 pain.  Dull. If turns head to R, can feel it L occiput towards shoulder. Has massaging pillow and not help. Stretching not help.Biofreeze/tiger balm-not help. Tylenol not help.   Not tried heat/ice/motrin.   No injury.  Vertigo 3-4 wks ago.  Blurry vision intermitt both eyes.   No new weakness, etc.   Occ off balance if wakes up in night.  Occ if gets up from lying down, dizzy.   NAS   not enuf water.   No n/v.   Covid 2 mo ago.  Many years, intermitt dbl vision. And then HA after-h/o migraine-ocular.    Insomnia-tart cherry juice and Mg.  Health Maintenance Due  Topic Date Due   PAP SMEAR-Modifier  Never done   MAMMOGRAM  08/09/2019    Past Medical History:  Diagnosis Date   Thyroid disease     Past Surgical History:  Procedure Laterality Date   BUNIONECTOMY Left 2017   also had R bunionectomy in 1984 approx   VULVA SURGERY  2019    Outpatient Medications Prior to Visit  Medication Sig Dispense Refill   levothyroxine (SYNTHROID) 137 MCG tablet Synthroid 137 mcg tablet  Prescribed by non [facility] MD     Cyclobenzaprine HCl (FLEXERIL PO) Take by mouth as needed.     ibuprofen (ADVIL) 600 MG tablet Take 1 tablet (600 mg total) by mouth every 8 (eight) hours as needed. 30 tablet 1   Thyroid (LEVOTHYROXINE-LIOTHYRONINE PO)      No facility-administered medications prior to visit.    Allergies  Allergen Reactions    Sulfacetamide-Prednisolone Other (See Comments)   Sulfamethoxazole Rash    Rash covering her entire body   ZGY:[id number] except as noted in HPI      Objective:     BP 110/80    Pulse 68    Temp 98.1 F (36.7 C) (Temporal)    Ht 5\' 8"  (1.727 m)    Wt 161 lb 8 oz (73.3 kg)    LMP  (LMP Unknown)    SpO2 98%    BMI 24.56 kg/m  Wt Readings from Last 3 Encounters:  09/29/21 161 lb 8 oz (73.3 kg)  10/28/19 169 lb 6.4 oz (76.8 kg)  09/02/19 166 lb 6.4 oz (75.5 kg)        Gen: WDWN NAD WF HEENT: NCAT, conjunctiva not injected, sclera nonicteric.  EOMI TM WNL B, OP moist, no exudates  NECK:  supple, no thyromegaly, no nodes, no carotid bruits CARDIAC: RRR, S1S2+, no murmur. DP 2+B LUNGS: CTAB. No wheezes ABDOMEN:  BS+, soft, NTND, No HSM, no masses EXT:  no edema MSK: Hands arthritic.  Tight muscles in neck and tender on L and near ear. NEURO: A&O x3.  CN II-XII intact.  F-N, RAM, pronator drift, romberg neg.  PSYCH: normal mood. Good eye contact  Assessment & Plan:   Problem List Items Addressed This Visit   None Visit Diagnoses     Chronic nonintractable headache, unspecified headache type    -  Primary   Blurry vision, bilateral       Cervicalgia          HA-prob from neck but does get visual changes.  Check MRI.  Stretches, heat. Nsaids.  Flexeril q hs.  PT. Check labs.  Worse, new changes, let me know.  Cont Mg and increase water Visual changes-? Occular migraine variants, other.  Check MRI Cervicagia-stretches.  To PT.  Massage.   No orders of the defined types were placed in this encounter.   Angelena Sole, MD

## 2021-09-30 LAB — COMPREHENSIVE METABOLIC PANEL
ALT: 15 U/L (ref 0–35)
AST: 16 U/L (ref 0–37)
Albumin: 4.4 g/dL (ref 3.5–5.2)
Alkaline Phosphatase: 67 U/L (ref 39–117)
BUN: 14 mg/dL (ref 6–23)
CO2: 29 mEq/L (ref 19–32)
Calcium: 9.7 mg/dL (ref 8.4–10.5)
Chloride: 105 mEq/L (ref 96–112)
Creatinine, Ser: 0.84 mg/dL (ref 0.40–1.20)
GFR: 76.9 mL/min (ref >60.00)
Glucose, Bld: 85 mg/dL (ref 70–99)
Potassium: 4.6 mEq/L (ref 3.5–5.1)
Sodium: 140 mEq/L (ref 135–145)
Total Bilirubin: 0.6 mg/dL (ref 0.2–1.2)
Total Protein: 7 g/dL (ref 6.0–8.3)

## 2021-09-30 LAB — C-REACTIVE PROTEIN: CRP: <1 mg/dL (ref 0.5–20.0)

## 2021-10-18 ENCOUNTER — Encounter: Payer: Self-pay | Admitting: Physical Therapy

## 2021-10-18 ENCOUNTER — Other Ambulatory Visit: Payer: Self-pay

## 2021-10-18 ENCOUNTER — Ambulatory Visit (INDEPENDENT_AMBULATORY_CARE_PROVIDER_SITE_OTHER): Payer: 59 | Admitting: Physical Therapy

## 2021-10-18 DIAGNOSIS — R519 Headache, unspecified: Secondary | ICD-10-CM

## 2021-10-18 DIAGNOSIS — M542 Cervicalgia: Secondary | ICD-10-CM

## 2021-10-18 DIAGNOSIS — G8929 Other chronic pain: Secondary | ICD-10-CM

## 2021-10-18 NOTE — Therapy (Signed)
OUTPATIENT PHYSICAL THERAPY CERVICAL EVALUATION   Patient Name: Melanie Li MRN: [id number] DOB:[birth year], 56 y.o., female Today's Date: 10/18/2021   PT End of Session - 10/18/21 1301     Visit Number 1    Number of Visits 8    Date for PT Re-Evaluation 12/13/21    Authorization Type UHC 30 VL    Authorization - Visit Number 1    Authorization - Number of Visits 30    Progress Note Due on Visit 10    PT Start Time 1302    PT Stop Time 1340    PT Time Calculation (min) 38 min             Past Medical History:  Diagnosis Date   Thyroid disease    Past Surgical History:  Procedure Laterality Date   BUNIONECTOMY Left 2017   also had R bunionectomy in 1984 approx   VULVA SURGERY  2019   Patient Active Problem List   Diagnosis Date Noted   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   Rosacea 01/24/2019   H/O vitamin D deficiency 01/24/2019   Hypothyroidism (acquired) 01/09/2019   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    PCP: Tawnya Crook, MD  REFERRING PROVIDER: Tawnya Crook, MD  REFERRING DIAG: [phone] (ICD-10-CM) - Chronic nonintractable headache, unspecified headache type M54.2 (ICD-10-CM) - Cervicalgia   THERAPY DIAG:  Chronic nonintractable headache, unspecified headache type  Cervicalgia  ONSET DATE: 4 months ago  SUBJECTIVE:                                                                                                                                                                                                         SUBJECTIVE STATEMENT: States that she has been having pain in her left shoulder and it is going up into her neck. States that she has been getting headaches but is currently not having a headache.  MRI scheduled next Monday, right handed. No weakness noted. States the muscle relaxors have helped but that she still has the pain/tightness.  PERTINENT HISTORY:  Hx of sciatica  PAIN:  Are you having pain? Yes NPRS scale: 1/10, 5  Pain  location: left shoulder PAIN TYPE: tight Pain description:  tight   Aggravating factors: purse on shoulder, left side lying. Relieving factors: muscle relaxer  FALLS:  Has patient fallen in last 6 months? No Number of falls: 0   OCCUPATION: retired, Freight forwarder   PLOF: Independent  PATIENT GOALS to have less pain  OBJECTIVE:   DIAGNOSTIC FINDINGS:  awaiting MRI    COGNITION:  Overall cognitive status: Within functional limits for tasks assessed    POSTURE:  Forward head, rounded shoulders, pelvic sitting.  PALPATION: Tenderness to palpation along suboccipitals bilaterally and left upper trap, increased resting tone noted throughout cervical musculature bilaterally   CERVICAL AROM/PROM  A/PROM A/PROM (deg) 10/18/2021  Flexion WNL  Extension 25*  Right lateral flexion 25  Left lateral flexion 20*  Right rotation 80  Left rotation 35*   (Blank rows = not tested) *tight/pain in neck on left side    UE Measurements Upper Extremity Right 10/18/2021 Left 10/18/2021   A/PROM MMT A/PROM MMT  Shoulder Flexion WNL 4+ WNL 4  Shoulder Extension      Shoulder Abduction WNL 4+ WNL 4-**  Shoulder Adduction      Shoulder Internal Rotation T12 SO 4+ T8SP* 4  Shoulder External Rotation C8 SP  4+ T4 SP* 4-  Elbow Flexion      Elbow Extension      Wrist Flexion      Wrist Extension      Wrist Supination      Wrist Pronation      Wrist Ulnar Deviation      Wrist Radial Deviation      Grip Strength NA  NA     (Blank rows = not tested)   * winging of shoulder blades   **pain in shoulder   TODAY'S TREATMENT:  10/18/2021 Therapeutic Exercise:  Aerobic: Supine: TRA activation - long exhale - x10, 10" holds tactile cues, chin tucks x15 5" holds, cervical ROT x10 5" holds bilateral Prone:  Seated:  Standing: Neuromuscular Re-education: Manual Therapy: Therapeutic Activity: Self Care: Trigger Point Dry Needling:  Modalities:     PATIENT EDUCATION:   Education details: on current presentation, on HEP, posture and rationale for exercises and POC Person educated: Patient Education method: Explanation, Demonstration, and Handouts Education comprehension: verbalized understanding    HOME EXERCISE PROGRAM: [id number]  ASSESSMENT:  CLINICAL IMPRESSION: Patient is a 56 y.o. female who was seen today for physical therapy evaluation and treatment for neck pain and headaches. Patient presents with ROM, strength and postural deficits that are contributing to current presentation. Educated patient in current POC and patient would greatly benefit from skilled PT to address deficits and improve overall QOL..    OBJECTIVE IMPAIRMENTS decreased activity tolerance, decreased endurance, decreased mobility, decreased ROM, decreased strength, impaired UE functional use, postural dysfunction, and pain.   ACTIVITY LIMITATIONS cleaning, community activity, and yard work.   PERSONAL FACTORS Age and Fitness are also affecting patient's functional outcome.    REHAB POTENTIAL: Good  CLINICAL DECISION MAKING: Stable/uncomplicated  EVALUATION COMPLEXITY: Low   GOALS: Goals reviewed with patient? Yes SHORT TERM GOALS:  STG Name Target Date Goal status  1 Patient will be independent in self management strategies to improve quality of life and functional outcomes. Baseline:  new HEP 11/08/2021  INITIAL  2 Patient will report at least 50% improvement in overall symptoms and/or function to demonstrate improved functional mobility Baseline: 0% 11/08/2021 INITIAL  3 Patient will be able to demonstrate at least 60 degrees of cervical rotation to improve ability to turn head Baseline: see above 11/08/2021 INITIAL  4  Baseline:    5  Baseline:    6  Baseline:    7  Baseline:     LONG TERM GOALS:   LTG Name Target Date Goal status  1 Patient will report at least 75% improvement in overall symptoms and/or function to demonstrate improved functional  mobility Baseline:0% 12/13/2021  INITIAL  2 Patient will be able to demonstrate good sitting posture for at least a minute to improve postural endurance Baseline: unable 12/13/2021 INITIAL  3 Patient will be able to perform pain*free cervical ROM to improve functional mobility of neck.  Baseline: 12/13/2021 INITIAL  4  Baseline:    5  Baseline:    6  Baseline:    7  Baseline:       PLAN: PT FREQUENCY: 1-2x/week for total for 8 visits  PT DURATION: 8 weeks  PLANNED INTERVENTIONS: Therapeutic exercises, Therapeutic activity, Neuro Muscular re-education, Balance training, Gait training, Patient/Family education, Joint mobilization, Canalith repositioning, Dry Needling, Electrical stimulation, Spinal mobilization, Cryotherapy, Moist heat, and Manual therapy  PLAN FOR NEXT SESSION: cervical ROM, core and cervical strengthening, isometrics MET of neck for mobility, shoulder strengthening (protraction and ER.  2:02 PM, 10/18/21 Jerene Pitch, DPT Physical Therapy with [facility]  [phone] office

## 2021-10-25 ENCOUNTER — Other Ambulatory Visit: Payer: Self-pay

## 2021-10-25 ENCOUNTER — Ambulatory Visit (INDEPENDENT_AMBULATORY_CARE_PROVIDER_SITE_OTHER): Payer: 59 | Admitting: Physical Therapy

## 2021-10-25 ENCOUNTER — Encounter: Payer: Self-pay | Admitting: Physical Therapy

## 2021-10-25 ENCOUNTER — Other Ambulatory Visit: Payer: 59

## 2021-10-25 ENCOUNTER — Ambulatory Visit
Admission: RE | Admit: 2021-10-25 | Discharge: 2021-10-25 | Disposition: A | Discharge status: Home / Self Care | Payer: 59 | Source: Ambulatory Visit | Attending: Family Medicine | Admitting: Family Medicine

## 2021-10-25 DIAGNOSIS — M542 Cervicalgia: Secondary | ICD-10-CM | POA: Diagnosis not present

## 2021-10-25 DIAGNOSIS — M6281 Muscle weakness (generalized): Secondary | ICD-10-CM | POA: Diagnosis not present

## 2021-10-25 DIAGNOSIS — G8929 Other chronic pain: Secondary | ICD-10-CM

## 2021-10-25 DIAGNOSIS — H538 Other visual disturbances: Secondary | ICD-10-CM

## 2021-10-25 DIAGNOSIS — R519 Headache, unspecified: Secondary | ICD-10-CM

## 2021-10-25 NOTE — Therapy (Signed)
?OUTPATIENT PHYSICAL THERAPY TREATMENT NOTE ? ? ?Patient Name: Melanie Li ?MRN: [id number] ?DOB:1966-04-24, 56 y.o., female ?Today's Date: 10/25/2021 ? ?PCP: Tawnya Crook, MD ?REFERRING PROVIDER: Tawnya Crook, MD ? ? PT End of Session - 10/25/21 1256   ? ? Visit Number 2   ? Number of Visits 8   ? Date for PT Re-Evaluation 12/13/21   ? Authorization Type UHC 30 VL   ? Authorization - Visit Number 2   ? Authorization - Number of Visits 30   ? Progress Note Due on Visit 10   ? PT Start Time 1300   ? PT Stop Time 1340   ? PT Time Calculation (min) 40 min   ? ?  ?  ? ?  ? ? ?Past Medical History:  ?Diagnosis Date  ? Thyroid disease   ? ?Past Surgical History:  ?Procedure Laterality Date  ? BUNIONECTOMY Left 2017  ? also had R bunionectomy in 1984 approx  ? VULVA SURGERY  2019  ? ?Patient Active Problem List  ? Diagnosis Date Noted  ? 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 Rosacea 01/24/2019  ? H/O vitamin D deficiency 01/24/2019  ? Hypothyroidism (acquired) 01/09/2019  ? 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 ? ?  ?PCP: Tawnya Crook, MD ?  ?REFERRING PROVIDER: Tawnya Crook, MD ?  ?REFERRING DIAG: R51.9,G89.29 (ICD-10-CM) - Chronic nonintractable headache, unspecified headache type ?M54.2 (ICD-10-CM) - Cervicalgia ?  ?  ?THERAPY DIAG:  ?Chronic nonintractable headache, unspecified headache type ?  ?Cervicalgia ?  ?ONSET DATE: 4 months ago ?  ?SUBJECTIVE:                                                                                                                                                                                                        ?  ?SUBJECTIVE STATEMENT: ?10/25/2021 ?states that she felt better from last session.  ? ?Eval: ?States that she has been having pain in her left shoulder and it is going up into her neck. States that she has been getting headaches but is currently not having a headache.  MRI scheduled next Monday, right handed. No weakness noted. States the muscle relaxors have helped  but that she still has the pain/tightness.  ?PERTINENT HISTORY:  ?Hx of sciatica ?  ?PAIN:  ?Are you having pain? Yes ?NPRS scale: 1/10 ?Pain location: left shoulder ?PAIN TYPE: tight ?Pain description:  tight   ?Aggravating factors: purse on shoulder, left side lying. ?Relieving factors: muscle relaxer ?  ?FALLS:  ?Has patient fallen in last 6 months? No ?Number  of falls: 0 ?  ?  ?OCCUPATION: retired, Designer, television/film set gardening  ?  ?PLOF: Independent ?  ?PATIENT GOALS to have less pain ?  ?OBJECTIVE:  ?  ?DIAGNOSTIC FINDINGS:  ?awaiting MRI ?  ?  ?  ?POSTURE:  ?Forward head, rounded shoulders, pelvic sitting. ?  ?PALPATION: ?Tenderness to palpation along suboccipitals bilaterally and left upper trap, increased resting tone noted throughout cervical musculature bilaterally           ?  ?CERVICAL AROM/PROM ?  ?A/PROM A/PROM (deg) ?10/18/2021  ?Flexion WNL  ?Extension 25*  ?Right lateral flexion 25  ?Left lateral flexion 20*  ?Right rotation 80  ?Left rotation 35*  ? (Blank rows = not tested) ?*tight/pain in neck on left side ?  ?  ?         UE Measurements ?      ?Upper Extremity Right ?10/18/2021 Left ?10/18/2021  ?  A/PROM MMT A/PROM MMT  ?Shoulder Flexion WNL 4+ WNL 4  ?Shoulder Extension          ?Shoulder Abduction WNL 4+ WNL 4-**  ?Shoulder Adduction          ?Shoulder Internal Rotation T12 SO 4+ T8SP* 4  ?Shoulder External Rotation C8 SP  4+ T4 SP* 4-  ?Elbow Flexion          ?Elbow Extension          ?Wrist Flexion          ?Wrist Extension          ?Wrist Supination          ?Wrist Pronation          ?Wrist Ulnar Deviation          ?Wrist Radial Deviation          ?Grip Strength NA   NA    ?                   (Blank rows = not tested) ?                   * winging of shoulder blades ?                   **pain in shoulder ?  ?  ?TODAY'S TREATMENT:  ? ?10/25/2021 ?Therapeutic Exercise: ?          Aerobic: ?Supine: scapular protraction x20 5" holds, contract/relax with cervical rotation isometrics 3x5 5" holds+  bilateral, TRA activation - long exhale - x10, 10" holds tactile cues, , cervical ROT x10 5" holds bilateral, bridge x16 5" holds ?Prone: ?          Seated: ?          Standing: ?Neuromuscular Re-education: ?Manual Therapy: STM to bilateral cervical paraspinals and suboccipitals, traction with cervical ROT with end range holds, STM to left tricpes - tolerated well ?Therapeutic Activity: ?Self Care: ?Trigger Point Dry Needling:  ?Modalities:  ?  ? ?Previous HEP ?chin tucks x15 5" holds ?  ?  ?PATIENT EDUCATION:  ?Education details: on rationale for exercises  ?Person educated: Patient ?Education method: Explanation, Demonstration, and Handouts ?Education comprehension: verbalized understanding ?  ?  ?  ?HOME EXERCISE PROGRAM: ?[id number] ?  ?ASSESSMENT: ?  ?CLINICAL IMPRESSION: ?10/25/2021 ?Tolerated progression of exercises well. Able to add in isometrics to cervical spine and bridges for shoulder extension and activation. Slight pain noted in left triceps after bridging, this was alleviated with STM and contract relax to  triceps. No pain noted end of session and improved mobility. Will continue with current POC. ?  ?  ?OBJECTIVE IMPAIRMENTS decreased activity tolerance, decreased endurance, decreased mobility, decreased ROM, decreased strength, impaired UE functional use, postural dysfunction, and pain.  ?  ?ACTIVITY LIMITATIONS cleaning, community activity, and yard work.  ?  ?PERSONAL FACTORS Age and Fitness are also affecting patient's functional outcome.  ?  ?  ?REHAB POTENTIAL: Good ?  ?CLINICAL DECISION MAKING: Stable/uncomplicated ?  ?EVALUATION COMPLEXITY: Low ?  ?  ?GOALS: ?Goals reviewed with patient? Yes ?SHORT TERM GOALS: ?  ?STG Name Target Date Goal status  ?1 Patient will be independent in self management strategies to improve quality of life and functional outcomes. ?Baseline:  new HEP 11/08/2021 ?  INITIAL  ?2 Patient will report at least 50% improvement in overall symptoms and/or function to  demonstrate improved functional mobility ?Baseline: 0% 11/08/2021 INITIAL  ?3 Patient will be able to demonstrate at least 60 degrees of cervical rotation to improve ability to turn head ?Baseline: see above 11/08/2021 INITIAL  ?4   ?Baseline:      ?5   ?Baseline:      ?6   ?Baseline:      ?7   ?Baseline:      ?  ?LONG TERM GOALS:  ?  ?LTG Name Target Date Goal status  ?1 Patient will report at least 75% improvement in overall symptoms and/or function to demonstrate improved functional mobility ?Baseline:0% 12/13/2021  INITIAL  ?2 Patient will be able to demonstrate good sitting posture for at least a minute to improve postural endurance ?Baseline: unable 12/13/2021 INITIAL  ?3 Patient will be able to perform pain*free cervical ROM to improve functional mobility of neck.  ?Baseline: 12/13/2021 INITIAL  ?4   ?Baseline:      ?5   ?Baseline:      ?6   ?Baseline:      ?7   ?Baseline:      ?  ?  ?  ?PLAN: ?PT FREQUENCY: 1-2x/week for total for 8 visits ?  ?PT DURATION: 8 weeks ?  ?PLANNED INTERVENTIONS: Therapeutic exercises, Therapeutic activity, Neuro Muscular re-education, Balance training, Gait training, Patient/Family education, Joint mobilization, Canalith repositioning, Dry Needling, Electrical stimulation, Spinal mobilization, Cryotherapy, Moist heat, and Manual therapy ?  ?PLAN FOR NEXT SESSION: contract/relax, traction tolerated well, STM to suboccipitals, self mobilization, t spine mobility, cervical ROM, core and cervical strengthening, isometrics MET of neck for mobility, shoulder strengthening (protraction and ER. ? ? ?1:57 PM, 10/25/21 ?Jerene Pitch, DPT ?Physical Therapy with [place] ?[facility]  ?[phone] office ? ? ?   ?

## 2021-11-01 ENCOUNTER — Encounter: Payer: 59 | Admitting: Physical Therapy

## 2021-11-08 ENCOUNTER — Encounter: Payer: 59 | Admitting: Physical Therapy

## 2021-11-09 ENCOUNTER — Encounter: Payer: BC Managed Care – PPO | Admitting: Family Medicine

## 2021-11-15 ENCOUNTER — Encounter: Payer: 59 | Admitting: Physical Therapy

## 2021-11-22 ENCOUNTER — Encounter: Payer: 59 | Admitting: Physical Therapy

## 2021-11-30 LAB — LIPID PANEL
Cholesterol: 212 — AB (ref 0–200)
HDL: 93 — AB (ref 35–70)
LDL Cholesterol: 106
Triglycerides: 73 (ref 40–160)

## 2021-11-30 LAB — HEPATIC FUNCTION PANEL
ALT: 24 U/L (ref 7–35)
AST: 27 (ref 13–35)
Alkaline Phosphatase: 85 (ref 25–125)
Bilirubin, Total: 0.5

## 2021-11-30 LAB — BASIC METABOLIC PANEL
BUN: 13 (ref 4–21)
CO2: 23 — AB (ref 13–22)
Chloride: 106 (ref 99–108)
Creatinine: 0.8 (ref 0.5–1.1)
Glucose: 60
Potassium: 4.4 mEq/L (ref 3.5–5.1)
Sodium: 142 (ref 137–147)

## 2021-11-30 LAB — CBC AND DIFFERENTIAL
HCT: 40 (ref 36–46)
Hemoglobin: 13.8 (ref 12.0–16.0)
Neutrophils Absolute: 2.5
Platelets: 259 10*3/uL (ref 150–400)
WBC: 4.4

## 2021-11-30 LAB — COMPREHENSIVE METABOLIC PANEL
Albumin: 5 (ref 3.5–5.0)
Calcium: 9.8 (ref 8.7–10.7)
eGFR: 80

## 2021-11-30 LAB — HEMOGLOBIN A1C: Hemoglobin A1C: 5.2

## 2021-11-30 LAB — CBC: RBC: 4.47 (ref 3.87–5.11)

## 2021-12-23 ENCOUNTER — Ambulatory Visit (INDEPENDENT_AMBULATORY_CARE_PROVIDER_SITE_OTHER): Payer: 59 | Admitting: Physician Assistant

## 2021-12-23 ENCOUNTER — Encounter: Payer: Self-pay | Admitting: Family Medicine

## 2021-12-23 ENCOUNTER — Telehealth: Payer: Self-pay | Admitting: *Deleted

## 2021-12-23 ENCOUNTER — Encounter: Payer: Self-pay | Admitting: Physician Assistant

## 2021-12-23 VITALS — BP 110/72 | HR 72 | Temp 97.6°F | Ht 68.0 in | Wt 164.2 lb

## 2021-12-23 DIAGNOSIS — E785 Hyperlipidemia, unspecified: Secondary | ICD-10-CM

## 2021-12-23 DIAGNOSIS — R8290 Unspecified abnormal findings in urine: Secondary | ICD-10-CM | POA: Diagnosis not present

## 2021-12-23 DIAGNOSIS — G245 Blepharospasm: Secondary | ICD-10-CM

## 2021-12-23 LAB — URINALYSIS, ROUTINE W REFLEX MICROSCOPIC
Bilirubin Urine: NEGATIVE
Hgb urine dipstick: NEGATIVE
Ketones, ur: NEGATIVE
Nitrite: NEGATIVE
Specific Gravity, Urine: <=1.005 — AB (ref 1.000–1.030)
Total Protein, Urine: NEGATIVE
Urine Glucose: NEGATIVE
Urobilinogen, UA: 0.2 (ref 0.0–1.0)
pH: 7.5 (ref 5.0–8.0)

## 2021-12-23 NOTE — Progress Notes (Signed)
Melanie Li is a 56 y.o. female here for a follow up of a pre-existing problem. ? ? ?History of Present Illness:  ? ?Chief Complaint  ?Patient presents with  ? abnormal urine results.  ? ? ?HPI ? ?Abnormal Urine; HLD ?Patient here for lab follow up. She recently had physical labs with her husband's employer. She had urinalysis which showed RBC and WBC in her urine. Her blood work also showed elevated LDL 106. She is not currently on any medication for this issue. No family hx of significant heart disease. No hx of smoking. No history of kidney stones. Had oxalate crystals in her urine as well. ? ?She does note that she has had lower R back pain for the past few months, She described this as "dull/shooting" pain. She has not tried any medication for her symptoms. No reported fever or chills.  No hx of kidney stones. No dysuria or hematuria. No reported vaginal bleeding.  ? ?Eye Twitching Milinda Hirschfeld ?She has been having issue with eye twitching. This has been going on for 4 days. She notes she has not sleeping well. She has had hard time sleeping at night due to menopause.  Has had worsening headache. She has been experiencing worsening headaches for the last couple of days. She has taken Aspirin with some relief. She also tried muscle relaxer. She recently had headache last night. This has resolved. She denies any symptoms at this time.  Denies slurred speech, fatigue, weakness, confusion, worst HA of life, n/v. ? ? ?Past Medical History:  ?Diagnosis Date  ? Thyroid disease   ? ?  ?Social History  ? ?Tobacco Use  ? Smoking status: Never  ? Smokeless tobacco: Never  ?Vaping Use  ? Vaping Use: Never used  ?Substance Use Topics  ? Alcohol use: Yes  ?  Comment: socially  ? Drug use: Never  ? ? ?Past Surgical History:  ?Procedure Laterality Date  ? BUNIONECTOMY Left 2017  ? also had R bunionectomy in 1984 approx  ? VULVA SURGERY  2019  ? ? ?Family History  ?Problem Relation Age of Onset  ? Cancer Mother   ?  Hypertension Mother   ? Asthma Daughter   ? Stroke Maternal Grandmother   ? Cancer Maternal Grandfather   ? Diabetes Paternal Grandmother   ? Cancer Paternal Grandfather   ? Hearing loss Paternal Grandfather   ? ? ?Allergies  ?Allergen Reactions  ? Sulfacetamide-Prednisolone Other (See Comments)  ? Sulfamethoxazole Rash  ?  Rash covering her entire body  ? ? ?Current Medications:  ? ?Current Outpatient Medications:  ?  cyclobenzaprine (FLEXERIL) 10 MG tablet, Take 1 tablet (10 mg total) by mouth at bedtime., Disp: 30 tablet, Rfl: 3 ?  levothyroxine (SYNTHROID) 100 MCG tablet, Take 100 mcg by mouth every morning., Disp: , Rfl:   ? ?Review of Systems:  ? ?ROS ?Negative unless otherwise specified per HPI. ? ? ?Vitals:  ? ?Vitals:  ? 12/23/21 0948  ?BP: 110/72  ?Pulse: 72  ?Temp: 97.6 ?F (36.4 ?C)  ?TempSrc: Temporal  ?SpO2: 99%  ?Weight: 164 lb 4 oz (74.5 kg)  ?Height: 5\' 8"  (1.727 m)  ?   ?Body mass index is 24.97 kg/m?. ? ?Physical Exam:  ? ?Physical Exam ?Vitals and nursing note reviewed.  ?Constitutional:   ?   General: She is not in acute distress. ?   Appearance: She is well-developed. She is not ill-appearing or toxic-appearing.  ?Cardiovascular:  ?   Rate and Rhythm: Normal rate and  regular rhythm.  ?   Pulses: Normal pulses.  ?   Heart sounds: Normal heart sounds, S1 normal and S2 normal.  ?Pulmonary:  ?   Effort: Pulmonary effort is normal.  ?   Breath sounds: Normal breath sounds.  ?Abdominal:  ?   Tenderness: There is no right CVA tenderness or left CVA tenderness.  ?Skin: ?   General: Skin is warm and dry.  ?Neurological:  ?   General: No focal deficit present.  ?   Mental Status: She is alert.  ?   GCS: GCS eye subscore is 4. GCS verbal subscore is 5. GCS motor subscore is 6.  ?   Cranial Nerves: No cranial nerve deficit.  ?   Sensory: No sensory deficit.  ?   Motor: No weakness.  ?   Gait: Gait normal.  ?Psychiatric:     ?   Speech: Speech normal.     ?   Behavior: Behavior normal. Behavior is  cooperative.  ? ? ?Assessment and Plan:  ? ?Abnormal urine ?Will recheck sent out UA ?Asymptomatic ?If blood is recurrent, will refer to urology for further evaluation ? ?Hyperlipidemia, unspecified hyperlipidemia type ?ASCVD is 1.8% ?Does not meet criteria for statin at this time ?Check lipid panel yearly ?Continue to work on diet and exercise ? ?Eye twitch ?No red flags on neuro exam ?Suspect due to sleep disturbances ?Continue to monitor ?If persists, recommend follow-up with PCP for further evaluation ? ?I,Savera Zaman,acting as a Neurosurgeon for [employer], PA.,have documented all relevant documentation on the behalf of Jarold Motto, PA,as directed by  Jarold Motto, PA while in the presence of Jarold Motto, [place].  ? ?IJarold Motto, PA, have reviewed all documentation for this visit. The documentation on 12/23/21 for the exam, diagnosis, procedures, and orders are all accurate and complete. ? ? ?Jarold Motto, PA-C ? ?

## 2021-12-23 NOTE — Telephone Encounter (Signed)
Patient called stating that she did a physical with her husband's job and was told she needed to follow-up with her PCP because she had RBC and WBC in her urine. Patient informed that PCP is out of the office and will transfer her to front office to get scheduled with someone else. Patient wanted to know if she could just have urine labs put in and come do the urine. I advised patient to schedule follow-up to discuss. ?

## 2021-12-23 NOTE — Telephone Encounter (Signed)
Patient scheduled with Jarold Motto, PA on 12/23/2021 at 9:40 AM. ?

## 2022-01-25 ENCOUNTER — Telehealth: Payer: Self-pay | Admitting: Family Medicine

## 2022-01-25 NOTE — Telephone Encounter (Signed)
Patient has called in stating that her heart rate has shot up to 193.  States it has dropped.  I have sent her over to triage for further evaluation.

## 2022-01-25 NOTE — Telephone Encounter (Signed)
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 Translation No Nurse Assessment Nurse: [place], RN, Marchelle Folks Date/Time (Melanie Time): 01/25/2022 11:21:22 AM Confirm and document reason for call. If symptomatic, describe symptoms. ---caller states her heart rate shot up to 193 while playing sport and got headache and sharp chest pain. apple watch notified her. while running around a little but not actually running. this happened 30 mins ago Does the patient have any new or worsening symptoms? ---Yes Will a triage be completed? ---Yes Related visit to physician within the last 2 weeks? ---No Does the PT have any chronic conditions? (i.e. diabetes, asthma, this includes High risk factors for pregnancy, etc.) ---Yes List chronic conditions. ---hypothyroidism Is this a behavioral health or substance abuse call? ---No Guidelines Guideline Title Affirmed Question Affirmed Notes Nurse Date/Time (Melanie Time) Heart Rate and Heartbeat Questions Difficulty breathing [place], RN, Marchelle Folks 01/25/2022 11:23:20 AM Disp. Time Lamount Cohen Time) Disposition Final User 01/25/2022 11:31:16 AM Go to ED Now Yes Humfleet, RN, Marchelle Folks PLEASE NOTE: All timestamps contained within this report are represented as [place] Standard Time. CONFIDENTIALTY NOTICE: This fax  transmission is intended only for the addressee. It contains information that is legally privileged, confidential or otherwise protected from use or disclosure. If you are not the intended recipient, you are strictly prohibited from reviewing, disclosing, copying using or disseminating any of this information or taking any action in reliance on or regarding this information. If you have received this fax in error, please notify us immediately by telephone so that we can arrange for its return to [place]. Phone: [phone], Toll-Free: [phone], Fax: [phone] Page: 2 of 2 Call Id: [id number] Caller Disagree/Comply Comply Caller Understands Yes PreDisposition InappropriateToAsk Care Advice Given Per Guideline GO TO ED NOW: * You need to be seen in the Emergency Department. * Go to the ED at ___________ Hospital. * Leave now. Drive carefully. NOTE TO TRIAGER - DRIVING: * Patient should not delay going to the emergency department. CARE ADVICE given per Heart Rate and Heartbeat Questions (Adult) guideline. * It is better and safer if another adult drives instead of you. Comments User: Jaclynn Major, RN Date/Time Lamount Cohen Time): 01/25/2022 11:31:08 AM provided brief education on possible reasons for high rate 193. SVT, Vtach, afib in hopes patient would agree to go to ER and seek care right away. she refused and wants a referral or appointment. spoke with back line User: Jaclynn Major, RN Date/Time Lamount Cohen Time): 01/25/2022 11:32:10 AM patient stated she went down to her knees trying to catch her breath. she did not tell me this right away. at first denied shortness of breath Referrals GO TO FACILITY REFUSED

## 2022-01-25 NOTE — Telephone Encounter (Signed)
Patient scheduled for 01/26/2022.

## 2022-01-25 NOTE — Telephone Encounter (Signed)
Patient stated that she feels fine now that's why she refused ER. Patient stated this has happened before. She stated that she just wanted to see if PCP can refer her to cardiology. Patient denies chest pain and SOB.

## 2022-01-26 ENCOUNTER — Ambulatory Visit (INDEPENDENT_AMBULATORY_CARE_PROVIDER_SITE_OTHER): Payer: 59 | Admitting: Family Medicine

## 2022-01-26 ENCOUNTER — Encounter: Payer: Self-pay | Admitting: Family Medicine

## 2022-01-26 VITALS — BP 102/70 | HR 54 | Temp 98.4°F | Ht 68.0 in | Wt 163.5 lb

## 2022-01-26 DIAGNOSIS — R002 Palpitations: Secondary | ICD-10-CM

## 2022-01-26 NOTE — Progress Notes (Signed)
   Subjective:     Patient ID: Melanie Li, female    DOB: [birth year], 56 y.o.   MRN: [id number]  Chief Complaint  Patient presents with   Elevated Heart Rate    Elevated HR, started yesterday, has happened before in the past Referral to Cardiology     HPI Elevated heart rate.  Started yesterday. Intermitt times of elevated HR-was playing pickleball. Was 193 per apple watch and was sob.  While walking to car, felt sharp pain for few seconds L lower chest.  Didn't feel "great".  Then had bad HA.  After 15min-ok.  This is the pattern.  Prev episode in March 174-2 days in a row.  Dec 187. Going on past 1 yr. Can occur at rest.  Caffeine 16 oz in am. Never syncope.   No drugs/sudafed, etc.   Daughter has SVT Similar in past.  Requesting Card referral  TSH 1 yr ago.   There are no preventive care reminders to display for this patient.   Past Medical History:  Diagnosis Date   Thyroid disease     Past Surgical History:  Procedure Laterality Date   BUNIONECTOMY Left 2017   also had R bunionectomy in 1984 approx   VULVA SURGERY  2019    Outpatient Medications Prior to Visit  Medication Sig Dispense Refill   cyclobenzaprine (FLEXERIL) 10 MG tablet Take 1 tablet (10 mg total) by mouth at bedtime. 30 tablet 3   levothyroxine (SYNTHROID) 100 MCG tablet Take 100 mcg by mouth every morning.     No facility-administered medications prior to visit.    Allergies  Allergen Reactions   Sulfa Antibiotics    Sulfacetamide-Prednisolone Other (See Comments)   Conjugated Estrogens Rash   Sulfamethoxazole Rash    Rash covering her entire body   ROS neg/noncontributory except as noted HPI/below      Objective:     BP 102/70   Pulse (!) 54   Temp 98.4 F (36.9 C) (Temporal)   Ht 5\' 8"  (1.727 m)   Wt 163 lb 8 oz (74.2 kg)   LMP  (LMP Unknown)   SpO2 98%   BMI 24.86 kg/m  Wt Readings from Last 3 Encounters:  01/26/22 163 lb 8 oz (74.2 kg)  12/23/21 164 lb 4 oz (74.5 kg)   09/29/21 161 lb 8 oz (73.3 kg)    Physical Exam   Gen: WDWN NAD HEENT: NCAT, conjunctiva not injected, sclera nonicteric NECK:  supple, no thyromegaly, no nodes, no carotid bruits CARDIAC: brady RRR, S1S2+, no murmur. DP 2+B LUNGS: CTAB. No wheezes ABDOMEN:  BS+, soft, NTND, No HSM, no masses EXT:  no edema MSK: no gross abnormalities.  NEURO: A&O x3.  CN II-XII intact.  PSYCH: normal mood. Good eye contact  EKG-NSR.  HR 57.  No ST changes     Assessment & Plan:   Problem List Items Addressed This Visit   None Visit Diagnoses     Palpitations    -  Primary   Relevant Orders   EKG 12-Lead (Completed)   TSH   T3, free   T4, free   Ambulatory referral to Cardiology      Palpitations-symptomatic.  Daughter has SVT.  Labs in Feb ok.  Check thyroid function(pt on meds).  Valsalva/cough when occurs.  Refer Card.   No orders of the defined types were placed in this encounter.   Mar, MD

## 2022-01-26 NOTE — Patient Instructions (Signed)
It was very nice to see you today!  If recurs-try coughing, maneuver-deep breath and bear down in chest.  ER if can't stop it. Sending to cardiologist.   PLEASE NOTE:  If you had any lab tests please let us know if you have not heard back within a few days. You may see your results on MyChart before we have a chance to review them but we will give you a call once they are reviewed by [place]. If we ordered any referrals today, please let us know if you have not heard from their office within the next week.   Please try these tips to maintain a healthy lifestyle:  Eat most of your calories during the day when you are active. Eliminate processed foods including packaged sweets (pies, cakes, cookies), reduce intake of potatoes, white bread, white pasta, and white rice. Look for whole grain options, oat flour or almond flour.  Each meal should contain half fruits/vegetables, one quarter protein, and one quarter carbs (no bigger than a computer mouse).  Cut down on sweet beverages. This includes juice, soda, and sweet tea. Also watch fruit intake, though this is a healthier sweet option, it still contains natural sugar! Limit to 3 servings daily.  Drink at least 1 glass of water with each meal and aim for at least 8 glasses per day  Exercise at least 150 minutes every week.

## 2022-01-27 ENCOUNTER — Telehealth: Payer: Self-pay

## 2022-01-27 NOTE — Telephone Encounter (Signed)
Unable to LMOVM, no VM set up. I have ordered labs as future. Patient needs to come back in for a redraw. Not able to perform tests with specimen collected yesterday.

## 2022-01-27 NOTE — Addendum Note (Signed)
Addended by: Laddie Aquas A on: 01/27/2022 10:07 AM   Modules accepted: Orders

## 2022-02-01 ENCOUNTER — Encounter: Payer: Self-pay | Admitting: Family Medicine

## 2022-02-03 ENCOUNTER — Other Ambulatory Visit (HOSPITAL_BASED_OUTPATIENT_CLINIC_OR_DEPARTMENT_OTHER): Payer: Self-pay

## 2022-02-03 ENCOUNTER — Other Ambulatory Visit: Payer: Self-pay

## 2022-02-03 ENCOUNTER — Emergency Department (HOSPITAL_BASED_OUTPATIENT_CLINIC_OR_DEPARTMENT_OTHER)
Admission: EM | Admit: 2022-02-03 | Discharge: 2022-02-03 | Disposition: A | Discharge status: Home / Self Care | Payer: 59 | Attending: Emergency Medicine | Admitting: Emergency Medicine

## 2022-02-03 ENCOUNTER — Encounter (HOSPITAL_BASED_OUTPATIENT_CLINIC_OR_DEPARTMENT_OTHER): Payer: Self-pay | Admitting: Emergency Medicine

## 2022-02-03 ENCOUNTER — Telehealth: Payer: Self-pay | Admitting: Family Medicine

## 2022-02-03 ENCOUNTER — Emergency Department (HOSPITAL_BASED_OUTPATIENT_CLINIC_OR_DEPARTMENT_OTHER): Payer: 59

## 2022-02-03 DIAGNOSIS — R42 Dizziness and giddiness: Secondary | ICD-10-CM | POA: Diagnosis not present

## 2022-02-03 DIAGNOSIS — R000 Tachycardia, unspecified: Secondary | ICD-10-CM

## 2022-02-03 DIAGNOSIS — R002 Palpitations: Secondary | ICD-10-CM | POA: Diagnosis present

## 2022-02-03 DIAGNOSIS — R0602 Shortness of breath: Secondary | ICD-10-CM | POA: Diagnosis not present

## 2022-02-03 DIAGNOSIS — E039 Hypothyroidism, unspecified: Secondary | ICD-10-CM | POA: Diagnosis not present

## 2022-02-03 LAB — CBC
HCT: 36.7 % (ref 36.0–46.0)
Hemoglobin: 12.4 g/dL (ref 12.0–15.0)
MCH: 30.6 pg (ref 26.0–34.0)
MCHC: 33.8 g/dL (ref 30.0–36.0)
MCV: 90.6 fL (ref 80.0–100.0)
Platelets: 259 10*3/uL (ref 150–400)
RBC: 4.05 MIL/uL (ref 3.87–5.11)
RDW: 12.1 % (ref 11.5–15.5)
WBC: 5.4 10*3/uL (ref 4.0–10.5)
nRBC: 0 % (ref 0.0–0.2)

## 2022-02-03 LAB — BASIC METABOLIC PANEL
Anion gap: 10 (ref 5–15)
BUN: 17 mg/dL (ref 6–20)
CO2: 24 mmol/L (ref 22–32)
Calcium: 9.5 mg/dL (ref 8.9–10.3)
Chloride: 107 mmol/L (ref 98–111)
Creatinine, Ser: 0.91 mg/dL (ref 0.44–1.00)
GFR, Estimated: >60 mL/min (ref >60)
Glucose, Bld: 119 mg/dL — ABNORMAL HIGH (ref 70–99)
Potassium: 4 mmol/L (ref 3.5–5.1)
Sodium: 141 mmol/L (ref 135–145)

## 2022-02-03 LAB — MAGNESIUM: Magnesium: 1.9 mg/dL (ref 1.7–2.4)

## 2022-02-03 LAB — TSH: TSH: 1.35 u[IU]/mL (ref 0.350–4.500)

## 2022-02-03 MED ORDER — METOPROLOL SUCCINATE ER 25 MG PO TB24
25.0000 mg | ORAL_TABLET | Freq: Every day | ORAL | 2 refills | Status: DC
  Filled 2022-02-03: qty 30, 30d supply, fill #0

## 2022-02-03 NOTE — ED Notes (Signed)
XRAY at the Bedside. ?

## 2022-02-03 NOTE — Telephone Encounter (Signed)
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 Provider Aris [place]. GOTO Facility Not Listed [facility] Translation No Nurse Assessment Nurse: Melanie Barthel, RN, Melanie Date/Time (Eastern Time): 02/03/2022 10:43:04 AM Confirm and document reason for call. If symptomatic, describe symptoms. ---Caller states she was in the office last week and did an EKG and took blood to test thyroid levels. Has not gotten results yet. HR was up to 180 and then back down to 94 today. Feeling dizzy. HR right now is 83. Does the patient have any new or worsening symptoms? ---Yes Will a triage be completed? ---Yes Related visit to physician within the last 2 weeks? ---Yes Does the PT have any chronic conditions? (i.e. diabetes, asthma, this includes High risk factors for pregnancy, etc.) ---Yes List chronic conditions. ---thyroid issues Is this a behavioral health or substance abuse call? ---No Guidelines Guideline Title Affirmed Question Affirmed Notes Nurse Date/Time (Eastern Time) Heart Rate and Heartbeat Questions Unable to walk, or can only walk with assistance (e.g., requires support) Bringas, RN, Melanie 02/03/2022 10:44:49 AM PLEASE NOTE: All timestamps contained within this report are represented as [place] Standard  Time. CONFIDENTIALTY NOTICE: This fax transmission is intended only for the addressee. It contains information that is legally privileged, confidential or otherwise protected from use or disclosure. If you are not the intended recipient, you are strictly prohibited from reviewing, disclosing, copying using or disseminating any of this information or taking any action in reliance on or regarding this information. If you have received this fax in error, please notify us immediately by telephone so that we can arrange for its return to [place]. Phone: [phone], Toll-Free: [phone], Fax: [phone] Page: 2 of 2 Call Id: [id number] Disp. Time Lamount Cohen Time) Disposition Final User 02/03/2022 10:47:53 AM 911 Outcome Documentation Bringas, RN, Melanie Reason: caller refused 911, will have husband take her to ER 02/03/2022 10:47:02 AM Call EMS 911 Now Yes Bringas, RN, Melanie Caller Disagree/Comply Disagree Caller Understands Yes PreDisposition InappropriateToAsk Care Advice Given Per Guideline CALL EMS 911 NOW: * Immediate medical attention is needed. You need to hang up and call 911 (or an ambulance). CARE ADVICE given per Heart Rate and Heartbeat Questions (Adult) guideline. Comments User: Melanie Reid, RN Date/Time Lamount Cohen Time): 02/03/2022 10:47:41 AM caller refused 911, will have husband take her to ER Referrals GO TO FACILITY OTHER - SPECIFY

## 2022-02-03 NOTE — Discharge Instructions (Signed)
You were seen in the emergency department for an elevated heart rate.  Your heart rate was normal here and your lab work was unremarkable.  Cardiology is recommending you start on a beta-blocker to decrease the likelihood of your heart accelerating.  We have also put a cardiology referral in for you.  Please stay well-hydrated.  If you experience another episode please try to capture the rhythm on your watch.  Return to the emergency department if any worsening or concerning symptoms.

## 2022-02-03 NOTE — ED Provider Notes (Signed)
[facility] EMERGENCY DEPT Provider Note   CSN: [id number] Arrival date & time: 02/03/22  1058     History  Chief Complaint  Patient presents with   Tachycardia    Melanie Li is a 56 y.o. female.  She has a history of hypothyroidism.  She is here after complaint of heart rate in the 180s today while playing pickle ball.  She said this has been going on for about 6 months with 5 different episodes 3 in the last few weeks.  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  She felt her heart beating fast and felt lightheaded.  Brief episode of a sharp chest pain.  She said the tachycardia lasts about 10 minutes and she feels lightheaded and loopy for about 30 minutes.  She has seen her primary care doctor and had some lab work done.  She denies any significant caffeine use, no other stimulants or herbals, has been eating and drinking well.  The history is provided by the patient.  Palpitations Palpitations quality:  Fast Onset quality:  Sudden Duration:  10 minutes Timing:  Constant Progression:  Resolved Chronicity:  Recurrent Context: exercise   Relieved by:  None tried Worsened by:  Nothing Ineffective treatments:  None tried Associated symptoms: dizziness and shortness of breath   Associated symptoms: no chest pain, no cough, no nausea and no vomiting   Risk factors: hx of thyroid disease   Risk factors: no hx of atrial fibrillation, no hx of PE and no OTC sinus medications        Home Medications Prior to Admission medications   Medication Sig Start Date End Date Taking? Authorizing Provider  cyclobenzaprine (FLEXERIL) 10 MG tablet Take 1 tablet (10 mg total) by mouth at bedtime. 09/29/21   Jeani Sow, MD  levothyroxine (SYNTHROID) 100 MCG tablet Take 100 mcg by mouth every morning. 11/28/21   [provider]      Allergies    Sulfa antibiotics, Sulfacetamide-prednisolone, Conjugated estrogens, and Sulfamethoxazole    Review of Systems    Review of Systems  Constitutional:  Negative for fever.  HENT:  Negative for sore throat.   Eyes:  Negative for visual disturbance.  Respiratory:  Positive for shortness of breath. Negative for cough.   Cardiovascular:  Positive for palpitations. Negative for chest pain.  Gastrointestinal:  Negative for abdominal pain, nausea and vomiting.  Genitourinary:  Negative for dysuria.  Skin:  Negative for rash.  Neurological:  Positive for dizziness and light-headedness.    Physical Exam Updated Vital Signs BP 130/85   Pulse 66   Temp 97.6 F (36.4 C) (Oral)   Resp 17   Ht 5\' 8"  (1.727 m)   Wt 72.6 kg   LMP  (LMP Unknown)   SpO2 100%   BMI 24.33 kg/m  Physical Exam Vitals and nursing note reviewed.  Constitutional:      General: She is not in acute distress.    Appearance: Normal appearance. She is well-developed.  HENT:     Head: Normocephalic and atraumatic.  Eyes:     Conjunctiva/sclera: Conjunctivae normal.  Cardiovascular:     Rate and Rhythm: Normal rate and regular rhythm.     Heart sounds: No murmur heard. Pulmonary:     Effort: Pulmonary effort is normal. No respiratory distress.     Breath sounds: Normal breath sounds.  Abdominal:     Palpations: Abdomen is soft.     Tenderness: There is no abdominal tenderness.  There is no guarding or rebound.  Musculoskeletal:     Cervical back: Neck supple.     Right lower leg: No edema.     Left lower leg: No edema.  Skin:    General: Skin is warm and dry.     Capillary Refill: Capillary refill takes less than 2 seconds.  Neurological:     General: No focal deficit present.     Mental Status: She is alert.     Sensory: No sensory deficit.     Motor: No weakness.     Gait: Gait normal.     ED Results / Procedures / Treatments   Labs (all labs ordered are listed, but only abnormal results are displayed) Labs Reviewed  BASIC METABOLIC PANEL - Abnormal; Notable for the following components:      Result Value    Glucose, Bld 119 (*)    All other components within normal limits  CBC  MAGNESIUM  TSH    EKG EKG Interpretation  Date/Time:  Thursday February 03 2022 11:15:02 EDT Ventricular Rate:  62 PR Interval:  145 QRS Duration: 99 QT Interval:  426 QTC Calculation: 433 R Axis:   29 Text Interpretation: Sinus rhythm nl intervals No old tracing to compare Confirmed by Meridee Score [id number]) on 02/03/2022 11:19:54 AM  Radiology DG Chest Port 1 View  Result Date: 02/03/2022 CLINICAL DATA:  Palpitations, shortness of breath, repeated with nipple markers EXAM: PORTABLE CHEST 1 VIEW COMPARISON:  02/03/2022 11:45 a.m. FINDINGS: 12 mm nodular density projecting over the periphery of the right lung correlates with the nipple marker and is favored to represent a nipple shadow. Cardiac and mediastinal contours are within normal limits. No focal pulmonary opacity. No pleural effusion or pneumothorax. No acute osseous abnormality. Thoracic dextrocurvature. IMPRESSION: No acute cardiopulmonary process. Electronically Signed   By: Wiliam Ke M.D.   On: 02/03/2022 12:30   DG Chest Port 1 View  Result Date: 02/03/2022 CLINICAL DATA:  Palpitations, shortness of breath EXAM: PORTABLE CHEST 1 VIEW COMPARISON:  01/09/2019 FINDINGS: The heart size and mediastinal contours are within normal limits. 12 mm nodular density projecting over the periphery of the right lung base, possibly representing a nipple shadow. Lungs appear otherwise clear. No pleural effusion or pneumothorax. Thoracic dextrocurvature. IMPRESSION: Small nodular density projecting over the periphery of the right lung base, possibly representing a nipple shadow. Repeat radiograph with nipple markers could be performed to confirm. Electronically Signed   By: Duanne Guess D.O.   On: 02/03/2022 11:51    Procedures Procedures    Medications Ordered in ED Medications - No data to display  ED Course/ Medical Decision Making/ A&P Clinical Course as of  02/03/22 1744  Thu Feb 03, 2022  1344 Discussed with the [facility] cardiology.  He is recommending starting the patient on Toprol 25 mg and putting in a referral for cardiology.  Have the patient try to capture a rhythm strip if she has another event.  Reviewed with patient and she is comfortable plan. [MB]    Clinical Course User Index [MB] Terrilee Files, MD                           Medical Decision Making Amount and/or Complexity of Data Reviewed Labs: ordered. Radiology: ordered.  Risk Prescription drug management.   This patient complains of rapid heart rate, near syncope; this involves an extensive number of treatment Options and is a complaint that carries  with it a high risk of complications and morbidity. The differential includes tachycardia, SVT, A-fib, dehydration, metabolic derangement, hyperthyroidism  I ordered, reviewed and interpreted labs, which included CBC with normal white count normal hemoglobin, chemistries normal other than mildly elevated leukosis, TSH normal I ordered imaging studies which included chest x-ray and I independently    visualized and interpreted imaging which showed no acute findings Additional history obtained from patient's husband Previous records obtained and reviewed in epic including recent PCP visit I consulted cardiology Dr. Jens Som and discussed lab and imaging findings and discussed disposition.  Cardiac monitoring reviewed, normal sinus rhythm Social determinants considered, no significant barriers Critical Interventions: None  After the interventions stated above, I reevaluated the patient and found patient to be asymptomatic here and having no arrhythmias on monitor Admission and further testing considered, no indications for admission or further work-up at this time.  Per cards recommendation we will put on beta-blocker and referral for outpatient cardiology follow-up.  Return instructions discussed.         Final  Clinical Impression(s) / ED Diagnoses Final diagnoses:  Tachycardia    Rx / DC Orders ED Discharge Orders          Ordered    metoprolol succinate (TOPROL-XL) 25 MG 24 hr tablet  Daily        02/03/22 1346    Ambulatory referral to Cardiology       Comments: Palpitations, HR 180s on apple watch   02/03/22 1347              Terrilee Files, MD 02/03/22 1747

## 2022-02-03 NOTE — Telephone Encounter (Signed)
Patient called regarding Heart rate was 180 and is now currently 94. Patient stated she feels loopy. Patient was referred to the PCP Triage Nurse. Patient disconnected before transfer.  Awaiting Triage notes.

## 2022-02-03 NOTE — ED Notes (Signed)
Pt d/c home per MD order. Discharge summary reviewed, pt verbalizes understanding. Ambulatory off unit with spouse. No s/s of acute distress noted at discharge.  

## 2022-02-03 NOTE — ED Triage Notes (Signed)
Patient presents to ED via POV from pickle ball. Patient reports HR jumped up to 180. Patient denies chest pain or palpations. HR currently is 66. Reports feeling "loopy". Denies dizziness.

## 2022-02-03 NOTE — Telephone Encounter (Signed)
Called patient to follow-up and she stated that she was currently at ED at [facility]. PCP is aware.

## 2022-02-03 NOTE — Telephone Encounter (Signed)
Patient called regarding results.  Patient stated she was not feeling well. Please see Triage note.

## 2022-02-10 ENCOUNTER — Encounter: Payer: Self-pay | Admitting: Internal Medicine

## 2022-02-10 ENCOUNTER — Ambulatory Visit (INDEPENDENT_AMBULATORY_CARE_PROVIDER_SITE_OTHER): Payer: 59 | Admitting: Internal Medicine

## 2022-02-10 DIAGNOSIS — I4891 Unspecified atrial fibrillation: Secondary | ICD-10-CM | POA: Insufficient documentation

## 2022-02-10 DIAGNOSIS — I471 Supraventricular tachycardia: Secondary | ICD-10-CM | POA: Insufficient documentation

## 2022-02-10 DIAGNOSIS — R000 Tachycardia, unspecified: Secondary | ICD-10-CM | POA: Diagnosis not present

## 2022-02-10 NOTE — Patient Instructions (Addendum)
Medication Instructions:  Your physician recommends that you continue on your current medications as directed. Please refer to the Current Medication list given to you today.  Labwork: None ordered.  Testing/Procedures: None ordered.  Follow-Up:  Send [facility] a Tachycardia strip.  To send watch strips via mychart  1)save the strips from your health app onto your device used for mychart.  2)once saved in your files go to mychart and sign on  3)click messages - ask question - select the provider you are wanting to send the strips too.  4)click at the bottom add attachment.  5)select file and upload then send message    Any Other Special Instructions Will Be Listed Below (If Applicable).  If you need a refill on your cardiac medications before your next appointment, please call your pharmacy.   Important Information About Sugar

## 2022-02-10 NOTE — Progress Notes (Signed)
HPI Melanie Li is referred today by Dr. Crissie Sickles for evaluation of SVT. She has actually never had any documentation of SVT but notes that she will suddenly develop the symptoms of palpitations and her Apple watch and a bp cuff has shown that she has had HR's in the 200 range on 2 different occassions. The spells last up to an hour. She gets sob and has chest pressure. She has not had syncope.   Allergies  Allergen Reactions   Sulfa Antibiotics    Sulfacetamide-Prednisolone Other (See Comments)   Conjugated Estrogens Rash   Sulfamethoxazole Rash    Rash covering her entire body     Current Outpatient Medications  Medication Sig Dispense Refill   cyclobenzaprine (FLEXERIL) 10 MG tablet Take 1 tablet (10 mg total) by mouth at bedtime. (Patient taking differently: Take 10 mg by mouth 3 (three) times daily as needed for muscle spasms.) 30 tablet 3   levothyroxine (SYNTHROID) 100 MCG tablet Take 100 mcg by mouth every morning.     No current facility-administered medications for this visit.     Past Medical History:  Diagnosis Date   Tachycardia    Thyroid disease     ROS:   All systems reviewed and negative except as noted in the HPI.   Past Surgical History:  Procedure Laterality Date   BUNIONECTOMY Left 2017   also had R bunionectomy in 1984 approx   VULVA SURGERY  2019     Family History  Problem Relation Age of Onset   Cancer Mother    Hypertension Mother    Asthma Daughter    Stroke Maternal Grandmother    Cancer Maternal Grandfather    Diabetes Paternal Grandmother    Cancer Paternal Grandfather    Hearing loss Paternal Grandfather      Social History   Socioeconomic History   Marital status: Married    Spouse name: Not on file   Number of children: 1   Years of education: Not on file   Highest education level: Not on file  Occupational History   Not on file  Tobacco Use   Smoking status: Never   Smokeless tobacco: Never  Vaping Use    Vaping Use: Never used  Substance and Sexual Activity   Alcohol use: Yes    Comment: socially   Drug use: Never   Sexual activity: Yes  Other Topics Concern   Not on file  Social History Narrative   Retail buyer one   Social Determinants of Health   Financial Resource Strain: Not on file  Food Insecurity: Not on file  Transportation Needs: Not on file  Physical Activity: Not on file  Stress: Not on file  Social Connections: Not on file  Intimate Partner Violence: Not on file     BP 102/62   Pulse 78   Ht 5\' 8"  (1.727 m)   Wt 163 lb (73.9 kg)   LMP  (LMP Unknown)   SpO2 99%   BMI 24.78 kg/m   Physical Exam:  Well appearing NAD HEENT: Unremarkable Neck:  No JVD, no thyromegally Lymphatics:  No adenopathy Back:  No CVA tenderness Lungs:  Clear HEART:  Regular rate rhythm, no murmurs, no rubs, no clicks Abd:  soft, positive bowel sounds, no organomegally, no rebound, no guarding Ext:  2 plus pulses, no edema, no cyanosis, no clubbing Skin:  No rashes no nodules Neuro:  CN II through XII intact, motor grossly intact  Assess/Plan:  Probable SVT - we do not have any documented SVT 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 She will try to catch an episode on her Apple watch now that she knows how to operate the watch so that she can record the ECG. If no documented SVT we will have her wear a 14 day Zio monitor. She has had 2 episodes in less than 2 weeks. We discussed the use of a beta blocker and she does not have any interest in a beta blocker.   Melanie Gowda Emma Birchler,MD

## 2022-02-25 ENCOUNTER — Ambulatory Visit (HOSPITAL_BASED_OUTPATIENT_CLINIC_OR_DEPARTMENT_OTHER): Payer: 59 | Admitting: Cardiology

## 2022-05-16 ENCOUNTER — Encounter: Payer: Self-pay | Admitting: *Deleted

## 2022-08-04 ENCOUNTER — Encounter: Payer: Self-pay | Admitting: *Deleted

## 2022-08-24 ENCOUNTER — Ambulatory Visit: Payer: 59 | Admitting: Family Medicine

## 2023-08-20 ENCOUNTER — Other Ambulatory Visit: Payer: Self-pay

## 2023-08-20 ENCOUNTER — Emergency Department (HOSPITAL_BASED_OUTPATIENT_CLINIC_OR_DEPARTMENT_OTHER): Payer: 59

## 2023-08-20 ENCOUNTER — Emergency Department (HOSPITAL_BASED_OUTPATIENT_CLINIC_OR_DEPARTMENT_OTHER)
Admission: EM | Admit: 2023-08-20 | Discharge: 2023-08-21 | Disposition: A | Discharge status: Home / Self Care | Payer: 59 | Attending: Emergency Medicine | Admitting: Emergency Medicine

## 2023-08-20 ENCOUNTER — Encounter (HOSPITAL_BASED_OUTPATIENT_CLINIC_OR_DEPARTMENT_OTHER): Payer: Self-pay | Admitting: Emergency Medicine

## 2023-08-20 DIAGNOSIS — M25551 Pain in right hip: Secondary | ICD-10-CM | POA: Insufficient documentation

## 2023-08-20 DIAGNOSIS — M5410 Radiculopathy, site unspecified: Secondary | ICD-10-CM

## 2023-08-20 DIAGNOSIS — M545 Low back pain, unspecified: Secondary | ICD-10-CM | POA: Insufficient documentation

## 2023-08-20 LAB — COMPREHENSIVE METABOLIC PANEL
ALT: 13 U/L (ref 0–44)
AST: 13 U/L — ABNORMAL LOW (ref 15–41)
Albumin: 4.4 g/dL (ref 3.5–5.0)
Alkaline Phosphatase: 57 U/L (ref 38–126)
Anion gap: 9 (ref 5–15)
BUN: 15 mg/dL (ref 6–20)
CO2: 23 mmol/L (ref 22–32)
Calcium: 9.1 mg/dL (ref 8.9–10.3)
Chloride: 105 mmol/L (ref 98–111)
Creatinine, Ser: 0.92 mg/dL (ref 0.44–1.00)
GFR, Estimated: >60 mL/min (ref >60)
Glucose, Bld: 178 mg/dL — ABNORMAL HIGH (ref 70–99)
Potassium: 3.6 mmol/L (ref 3.5–5.1)
Sodium: 137 mmol/L (ref 135–145)
Total Bilirubin: 0.7 mg/dL (ref <1.2)
Total Protein: 7.1 g/dL (ref 6.5–8.1)

## 2023-08-20 LAB — CBC
HCT: 35.8 % — ABNORMAL LOW (ref 36.0–46.0)
Hemoglobin: 12.6 g/dL (ref 12.0–15.0)
MCH: 31 pg (ref 26.0–34.0)
MCHC: 35.2 g/dL (ref 30.0–36.0)
MCV: 88.2 fL (ref 80.0–100.0)
Platelets: 221 10*3/uL (ref 150–400)
RBC: 4.06 MIL/uL (ref 3.87–5.11)
RDW: 11.9 % (ref 11.5–15.5)
WBC: 12.7 10*3/uL — ABNORMAL HIGH (ref 4.0–10.5)
nRBC: 0 % (ref 0.0–0.2)

## 2023-08-20 LAB — TROPONIN I (HIGH SENSITIVITY): Troponin I (High Sensitivity): 2 ng/L (ref <18)

## 2023-08-20 LAB — PROTIME-INR
INR: 1 (ref 0.8–1.2)
Prothrombin Time: 13.7 s (ref 11.4–15.2)

## 2023-08-20 MED ORDER — HYDROMORPHONE HCL 1 MG/ML IJ SOLN
1.0000 mg | Freq: Once | INTRAMUSCULAR | Status: AC
  Administered 2023-08-20: 1 mg via INTRAVENOUS
  Filled 2023-08-20: qty 1

## 2023-08-20 MED ORDER — IOHEXOL 350 MG/ML SOLN
100.0000 mL | Freq: Once | INTRAVENOUS | Status: AC | PRN
  Administered 2023-08-20: 100 mL via INTRAVENOUS

## 2023-08-20 NOTE — ED Triage Notes (Signed)
Pt presents to the ED today for worsening chronic right hip, lower back, and leg pain. Pt states today she has not been able to walk due to pain. Pt reports bone spurs on her right hip, pt just received a steroid injection last week. Pt states she took cyclobenzaprine prior to arrival to the ED.

## 2023-08-20 NOTE — ED Notes (Signed)
MD notified of BP, MD to bedside.

## 2023-08-21 LAB — TROPONIN I (HIGH SENSITIVITY): Troponin I (High Sensitivity): 2 ng/L (ref <18)

## 2023-08-21 MED ORDER — KETOROLAC TROMETHAMINE 15 MG/ML IJ SOLN
30.0000 mg | Freq: Once | INTRAMUSCULAR | Status: AC
  Administered 2023-08-21: 30 mg via INTRAVENOUS
  Filled 2023-08-21: qty 2

## 2023-08-21 MED ORDER — DEXAMETHASONE SODIUM PHOSPHATE 10 MG/ML IJ SOLN
10.0000 mg | Freq: Once | INTRAMUSCULAR | Status: AC
Start: 2023-08-21 — End: 2023-08-21
  Administered 2023-08-21: 10 mg via INTRAVENOUS
  Filled 2023-08-21: qty 1

## 2023-08-21 MED ORDER — OXYCODONE HCL 5 MG PO TABS: 5.0000 mg | ORAL_TABLET | ORAL | 0 refills | Status: DC | PRN

## 2023-08-21 MED ORDER — MORPHINE SULFATE (PF) 4 MG/ML IV SOLN
4.0000 mg | Freq: Once | INTRAVENOUS | Status: AC
  Administered 2023-08-21: 4 mg via INTRAVENOUS
  Filled 2023-08-21: qty 1

## 2023-08-21 MED ORDER — NAPROXEN 500 MG PO TABS: 500.0000 mg | ORAL_TABLET | Freq: Two times a day (BID) | ORAL | 0 refills | Status: DC

## 2023-08-21 NOTE — ED Provider Notes (Signed)
[facility] EMERGENCY [facility] Emergency Department Provider Note MRN:  [id number]  Arrival date & time: 08/21/23     Chief Complaint   Hip Pain and Leg Pain   History of Present Illness   Melanie Li is a 57 y.o. year-old female with a history of A-fib presenting to the ED with chief complaint of hip and leg pain.  Severe pain to the right lower back, right hip, radiation to the right groin.  On and off for few days, much worse today after a car ride.  Was also having some rib pain earlier, thought maybe it was due to compensating with movement during this pain.  Denies fever, no numbness or weakness to the arms or legs, no bowel or bladder dysfunction.  Review of Systems  A thorough review of systems was obtained and all systems are negative except as noted in the HPI and PMH.   Patient's Health History    Past Medical History:  Diagnosis Date   Tachycardia    Thyroid disease     Past Surgical History:  Procedure Laterality Date   BUNIONECTOMY Left 2017   also had R bunionectomy in 1984 approx   VULVA SURGERY  2019    Family History  Problem Relation Age of Onset   Cancer Mother    Hypertension Mother    Asthma Daughter    Stroke Maternal Grandmother    Cancer Maternal Grandfather    Diabetes Paternal Grandmother    Cancer Paternal Grandfather    Hearing loss Paternal Grandfather     Social History   Socioeconomic History   Marital status: Married    Spouse name: Not on file   Number of children: 1   Years of education: Not on file   Highest education level: Not on file  Occupational History   Not on file  Tobacco Use   Smoking status: Never   Smokeless tobacco: Never  Vaping Use   Vaping status: Never Used  Substance and Sexual Activity   Alcohol use: Yes    Comment: socially   Drug use: Never   Sexual activity: Yes  Other Topics Concern   Not on file  Social History Narrative   Retail buyer one   Social Drivers of  Health   Financial Resource Strain: Not on file  Food Insecurity: Not on file  Transportation Needs: Not on file  Physical Activity: Not on file  Stress: Not on file  Social Connections: Not on file  Intimate Partner Violence: Not on file     Physical Exam   Vitals:   08/21/23 0215 08/21/23 0220  BP:  103/71  Pulse: 78 85  Resp: 13 (!) 21  Temp:  98 F (36.7 C)  SpO2: 95% 97%    CONSTITUTIONAL: Well-appearing, moderate distress due to pain NEURO/PSYCH:  Alert and oriented x 3, no focal deficits EYES:  eyes equal and reactive ENT/NECK:  no LAD, no JVD CARDIO: Regular rate, well-perfused, normal S1 and S2 PULM:  CTAB no wheezing or rhonchi GI/GU:  non-distended, non-tender MSK/SPINE:  No gross deformities, no edema SKIN:  no rash, atraumatic   *Additional and/or pertinent findings included in MDM below  Diagnostic and Interventional Summary    EKG Interpretation Date/Time:  Sunday August 20 2023 23:14:01 EST Ventricular Rate:  84 PR Interval:  137 QRS Duration:  110 QT Interval:  374 QTC Calculation: 443 R Axis:   45  Text Interpretation: Sinus rhythm Confirmed by Kennis Carina [id number]) on 08/21/2023  12:41:18 AM       Labs Reviewed  CBC - Abnormal; Notable for the following components:      Result Value   WBC 12.7 (*)    HCT 35.8 (*)    All other components within normal limits  COMPREHENSIVE METABOLIC PANEL - Abnormal; Notable for the following components:   Glucose, Bld 178 (*)    AST 13 (*)    All other components within normal limits  PROTIME-INR  TROPONIN I (HIGH SENSITIVITY)  TROPONIN I (HIGH SENSITIVITY)    CT Angio Chest/Abd/Pel for Dissection W and/or Wo Contrast  Final Result      Medications  HYDROmorphone (DILAUDID) injection 1 mg (1 mg Intravenous Given 08/20/23 2316)  iohexol (OMNIPAQUE) 350 MG/ML injection 100 mL (100 mLs Intravenous Contrast Given 08/20/23 2335)  ketorolac (TORADOL) 15 MG/ML injection 30 mg (30 mg Intravenous  Given 08/21/23 0118)  dexamethasone (DECADRON) injection 10 mg (10 mg Intravenous Given 08/21/23 0118)  morphine (PF) 4 MG/ML injection 4 mg (4 mg Intravenous Given 08/21/23 0118)     Procedures  /  Critical Care Procedures  ED Course and Medical Decision Making  Initial Impression and Ddx Patient arrives hypotensive with severe back pain, was also complaining of some chest or rib pain.  This brings immediate concern for aortic pathology though favoring more of a radicular back pain based on the location and the radiation.  Not having any signs or symptoms of myelopathy.  Perhaps the hypotension in triage was a vagal response to pain.  Blood pressure seems to be improving.  Awaiting CTA imaging, providing pain control.  Past medical/surgical history that increases complexity of ED encounter: A-fib, not anticoagulated  Interpretation of Diagnostics I personally reviewed the EKG and my interpretation is as follows: Sinus rhythm  Labs overall reassuring with no significant blood count or electrolyte disturbance  Patient Reassessment and Ultimate Disposition/Management     Dissection study is overall reassuring, no aortic pathology, some fluid in the right hip the patient explains that she had a steroid injection just over a week ago.  Overall highly doubt septic joint.  With the low back pain and radiation this seems consistent with radicular back pain.  On multiple reassessment patient has no numbness or weakness, her pain is much better controlled, appropriate for discharge.  Patient management required discussion with the following services or consulting groups:  None  Complexity of Problems Addressed Acute illness or injury that poses threat of life of bodily function  Additional Data Reviewed and Analyzed Further history obtained from: Further history from spouse/family member  Additional Factors Impacting ED Encounter Risk Prescriptions and Use of parenteral controlled  substances  Elmer Sow. Pilar Plate, MD [facility] Emergency Medicine [facility] [email or web address]  Final Clinical Impressions(s) / ED Diagnoses     ICD-10-CM   1. Radicular low back pain  M54.10       ED Discharge Orders          Ordered    oxyCODONE (ROXICODONE) 5 MG immediate release tablet  Every 4 hours PRN        08/21/23 0252    naproxen (NAPROSYN) 500 MG tablet  2 times daily        08/21/23 0252             Discharge Instructions Discussed with and Provided to Patient:    Discharge Instructions      You were evaluated in the Emergency Department and after careful evaluation, we did not  find any emergent condition requiring admission or further testing in the hospital.  Your exam/testing today is overall reassuring.  Symptoms seem to be due to a pinched nerve in your back.  Recommend follow-up with the spine experts.  Use the Naprosyn twice daily for pain.  Can use the oxycodone for more significant pain.  Please return to the Emergency Department if you experience any worsening of your condition.   Thank you for allowing [place] to be a part of your care.      Sabas Sous, MD 08/21/23 [phone]

## 2023-08-21 NOTE — Discharge Instructions (Signed)
You were evaluated in the Emergency Department and after careful evaluation, we did not find any emergent condition requiring admission or further testing in the hospital.  Your exam/testing today is overall reassuring.  Symptoms seem to be due to a pinched nerve in your back.  Recommend follow-up with the spine experts.  Use the Naprosyn twice daily for pain.  Can use the oxycodone for more significant pain.  Please return to the Emergency Department if you experience any worsening of your condition.   Thank you for allowing [place] to be a part of your care.

## 2023-08-24 DIAGNOSIS — M545 Low back pain, unspecified: Secondary | ICD-10-CM | POA: Diagnosis not present

## 2023-08-24 DIAGNOSIS — M5416 Radiculopathy, lumbar region: Secondary | ICD-10-CM | POA: Diagnosis not present

## 2023-08-25 DIAGNOSIS — M545 Low back pain, unspecified: Secondary | ICD-10-CM | POA: Diagnosis not present

## 2023-08-31 DIAGNOSIS — M5416 Radiculopathy, lumbar region: Secondary | ICD-10-CM | POA: Diagnosis not present

## 2023-09-13 DIAGNOSIS — M5416 Radiculopathy, lumbar region: Secondary | ICD-10-CM | POA: Diagnosis not present

## 2023-09-15 DIAGNOSIS — M545 Low back pain, unspecified: Secondary | ICD-10-CM | POA: Diagnosis not present

## 2023-09-15 DIAGNOSIS — M25551 Pain in right hip: Secondary | ICD-10-CM | POA: Diagnosis not present

## 2023-09-19 DIAGNOSIS — M25551 Pain in right hip: Secondary | ICD-10-CM | POA: Diagnosis not present

## 2023-09-22 ENCOUNTER — Encounter (HOSPITAL_COMMUNITY): Payer: Self-pay

## 2023-09-22 ENCOUNTER — Inpatient Hospital Stay (HOSPITAL_COMMUNITY)
Admission: EM | Admit: 2023-09-22 | Discharge: 2023-10-02 | DRG: 480 | Disposition: A | Discharge status: Home Health | Payer: 59 | Source: Ambulatory Visit | Attending: Internal Medicine | Admitting: Internal Medicine

## 2023-09-22 ENCOUNTER — Other Ambulatory Visit: Payer: Self-pay

## 2023-09-22 ENCOUNTER — Emergency Department (HOSPITAL_COMMUNITY): Payer: 59

## 2023-09-22 DIAGNOSIS — Z79890 Hormone replacement therapy (postmenopausal): Secondary | ICD-10-CM

## 2023-09-22 DIAGNOSIS — D62 Acute posthemorrhagic anemia: Secondary | ICD-10-CM | POA: Diagnosis not present

## 2023-09-22 DIAGNOSIS — Q2112 Patent foramen ovale: Secondary | ICD-10-CM | POA: Diagnosis not present

## 2023-09-22 DIAGNOSIS — E039 Hypothyroidism, unspecified: Secondary | ICD-10-CM | POA: Diagnosis not present

## 2023-09-22 DIAGNOSIS — Z791 Long term (current) use of non-steroidal anti-inflammatories (NSAID): Secondary | ICD-10-CM | POA: Diagnosis not present

## 2023-09-22 DIAGNOSIS — Z1159 Encounter for screening for other viral diseases: Secondary | ICD-10-CM

## 2023-09-22 DIAGNOSIS — E871 Hypo-osmolality and hyponatremia: Secondary | ICD-10-CM | POA: Diagnosis not present

## 2023-09-22 DIAGNOSIS — M00051 Staphylococcal arthritis, right hip: Principal | ICD-10-CM | POA: Diagnosis present

## 2023-09-22 DIAGNOSIS — Z882 Allergy status to sulfonamides status: Secondary | ICD-10-CM

## 2023-09-22 DIAGNOSIS — B9561 Methicillin susceptible Staphylococcus aureus infection as the cause of diseases classified elsewhere: Secondary | ICD-10-CM | POA: Diagnosis not present

## 2023-09-22 DIAGNOSIS — K6812 Psoas muscle abscess: Secondary | ICD-10-CM | POA: Diagnosis present

## 2023-09-22 DIAGNOSIS — R7881 Bacteremia: Secondary | ICD-10-CM | POA: Diagnosis present

## 2023-09-22 DIAGNOSIS — K5900 Constipation, unspecified: Secondary | ICD-10-CM | POA: Diagnosis not present

## 2023-09-22 DIAGNOSIS — Z823 Family history of stroke: Secondary | ICD-10-CM | POA: Diagnosis not present

## 2023-09-22 DIAGNOSIS — E876 Hypokalemia: Secondary | ICD-10-CM | POA: Diagnosis not present

## 2023-09-22 DIAGNOSIS — I3139 Other pericardial effusion (noninflammatory): Secondary | ICD-10-CM | POA: Diagnosis present

## 2023-09-22 DIAGNOSIS — M51369 Other intervertebral disc degeneration, lumbar region without mention of lumbar back pain or lower extremity pain: Secondary | ICD-10-CM | POA: Diagnosis present

## 2023-09-22 DIAGNOSIS — Z833 Family history of diabetes mellitus: Secondary | ICD-10-CM

## 2023-09-22 DIAGNOSIS — M869 Osteomyelitis, unspecified: Secondary | ICD-10-CM | POA: Diagnosis present

## 2023-09-22 DIAGNOSIS — M8618 Other acute osteomyelitis, other site: Secondary | ICD-10-CM | POA: Diagnosis not present

## 2023-09-22 DIAGNOSIS — D75838 Other thrombocytosis: Secondary | ICD-10-CM | POA: Diagnosis not present

## 2023-09-22 DIAGNOSIS — M51379 Other intervertebral disc degeneration, lumbosacral region without mention of lumbar back pain or lower extremity pain: Secondary | ICD-10-CM | POA: Diagnosis present

## 2023-09-22 DIAGNOSIS — Z888 Allergy status to other drugs, medicaments and biological substances status: Secondary | ICD-10-CM

## 2023-09-22 DIAGNOSIS — Z8249 Family history of ischemic heart disease and other diseases of the circulatory system: Secondary | ICD-10-CM | POA: Diagnosis not present

## 2023-09-22 DIAGNOSIS — Z96641 Presence of right artificial hip joint: Secondary | ICD-10-CM | POA: Diagnosis not present

## 2023-09-22 DIAGNOSIS — M25551 Pain in right hip: Secondary | ICD-10-CM

## 2023-09-22 DIAGNOSIS — Z825 Family history of asthma and other chronic lower respiratory diseases: Secondary | ICD-10-CM

## 2023-09-22 DIAGNOSIS — M86151 Other acute osteomyelitis, right femur: Secondary | ICD-10-CM | POA: Diagnosis not present

## 2023-09-22 DIAGNOSIS — M009 Pyogenic arthritis, unspecified: Secondary | ICD-10-CM | POA: Diagnosis present

## 2023-09-22 DIAGNOSIS — M25552 Pain in left hip: Secondary | ICD-10-CM | POA: Diagnosis not present

## 2023-09-22 DIAGNOSIS — I4891 Unspecified atrial fibrillation: Secondary | ICD-10-CM | POA: Diagnosis not present

## 2023-09-22 DIAGNOSIS — I951 Orthostatic hypotension: Secondary | ICD-10-CM | POA: Diagnosis not present

## 2023-09-22 DIAGNOSIS — Z885 Allergy status to narcotic agent status: Secondary | ICD-10-CM | POA: Diagnosis not present

## 2023-09-22 DIAGNOSIS — M00851 Arthritis due to other bacteria, right hip: Secondary | ICD-10-CM | POA: Diagnosis not present

## 2023-09-22 DIAGNOSIS — R52 Pain, unspecified: Secondary | ICD-10-CM

## 2023-09-22 DIAGNOSIS — M1611 Unilateral primary osteoarthritis, right hip: Secondary | ICD-10-CM | POA: Diagnosis not present

## 2023-09-22 DIAGNOSIS — M86251 Subacute osteomyelitis, right femur: Secondary | ICD-10-CM | POA: Diagnosis not present

## 2023-09-22 HISTORY — DX: Other complications of anesthesia, initial encounter: T88.59XA

## 2023-09-22 LAB — CBC WITH DIFFERENTIAL/PLATELET
Abs Immature Granulocytes: 0.32 10*3/uL — ABNORMAL HIGH (ref 0.00–0.07)
Basophils Absolute: 0.1 10*3/uL (ref 0.0–0.1)
Basophils Relative: 0 %
Eosinophils Absolute: 0 10*3/uL (ref 0.0–0.5)
Eosinophils Relative: 0 %
HCT: 28.7 % — ABNORMAL LOW (ref 36.0–46.0)
Hemoglobin: 9.3 g/dL — ABNORMAL LOW (ref 12.0–15.0)
Immature Granulocytes: 2 %
Lymphocytes Relative: 12 %
Lymphs Abs: 1.7 10*3/uL (ref 0.7–4.0)
MCH: 29.3 pg (ref 26.0–34.0)
MCHC: 32.4 g/dL (ref 30.0–36.0)
MCV: 90.5 fL (ref 80.0–100.0)
Monocytes Absolute: 1.1 10*3/uL — ABNORMAL HIGH (ref 0.1–1.0)
Monocytes Relative: 8 %
Neutro Abs: 10.8 10*3/uL — ABNORMAL HIGH (ref 1.7–7.7)
Neutrophils Relative %: 78 %
Platelets: 410 10*3/uL — ABNORMAL HIGH (ref 150–400)
RBC: 3.17 MIL/uL — ABNORMAL LOW (ref 3.87–5.11)
RDW: 12.2 % (ref 11.5–15.5)
WBC: 14 10*3/uL — ABNORMAL HIGH (ref 4.0–10.5)
nRBC: 0 % (ref 0.0–0.2)

## 2023-09-22 LAB — COMPREHENSIVE METABOLIC PANEL
ALT: 26 U/L (ref 0–44)
AST: 29 U/L (ref 15–41)
Albumin: 2.6 g/dL — ABNORMAL LOW (ref 3.5–5.0)
Alkaline Phosphatase: 68 U/L (ref 38–126)
Anion gap: 13 (ref 5–15)
BUN: 13 mg/dL (ref 6–20)
CO2: 21 mmol/L — ABNORMAL LOW (ref 22–32)
Calcium: 8.8 mg/dL — ABNORMAL LOW (ref 8.9–10.3)
Chloride: 102 mmol/L (ref 98–111)
Creatinine, Ser: 0.76 mg/dL (ref 0.44–1.00)
GFR, Estimated: >60 mL/min (ref >60)
Glucose, Bld: 108 mg/dL — ABNORMAL HIGH (ref 70–99)
Potassium: 3.7 mmol/L (ref 3.5–5.1)
Sodium: 136 mmol/L (ref 135–145)
Total Bilirubin: 0.9 mg/dL (ref 0.0–1.2)
Total Protein: 7.1 g/dL (ref 6.5–8.1)

## 2023-09-22 LAB — C-REACTIVE PROTEIN: CRP: 24.1 mg/dL — ABNORMAL HIGH (ref <1.0)

## 2023-09-22 LAB — SEDIMENTATION RATE: Sed Rate: >140 mm/h — ABNORMAL HIGH (ref 0–22)

## 2023-09-22 LAB — LACTIC ACID, PLASMA: Lactic Acid, Venous: 1 mmol/L (ref 0.5–1.9)

## 2023-09-22 MED ORDER — SODIUM CHLORIDE 0.9% FLUSH
3.0000 mL | Freq: Two times a day (BID) | INTRAVENOUS | Status: DC
  Administered 2023-09-23 – 2023-10-01 (×18): 3 mL via INTRAVENOUS

## 2023-09-22 MED ORDER — OXYCODONE-ACETAMINOPHEN 5-325 MG PO TABS
1.0000 | ORAL_TABLET | ORAL | Status: DC | PRN
  Administered 2023-09-22: 1 via ORAL
  Filled 2023-09-22: qty 1

## 2023-09-22 MED ORDER — MORPHINE SULFATE (PF) 4 MG/ML IV SOLN
4.0000 mg | Freq: Once | INTRAVENOUS | Status: AC
  Administered 2023-09-22: 4 mg via INTRAVENOUS
  Filled 2023-09-22: qty 1

## 2023-09-22 MED ORDER — HYDROMORPHONE HCL 1 MG/ML IJ SOLN
0.5000 mg | INTRAMUSCULAR | Status: DC | PRN
  Administered 2023-09-22 – 2023-10-02 (×20): 0.5 mg via INTRAVENOUS
  Filled 2023-09-22 (×19): qty 0.5

## 2023-09-22 MED ORDER — ACETAMINOPHEN 325 MG PO TABS
650.0000 mg | ORAL_TABLET | ORAL | Status: AC
  Administered 2023-09-22 – 2023-09-25 (×17): 650 mg via ORAL
  Filled 2023-09-22 (×17): qty 2

## 2023-09-22 MED ORDER — LACTATED RINGERS IV SOLN: INTRAVENOUS | Status: AC

## 2023-09-22 MED ORDER — MORPHINE SULFATE ER 15 MG PO TBCR
15.0000 mg | EXTENDED_RELEASE_TABLET | Freq: Two times a day (BID) | ORAL | Status: AC
  Administered 2023-09-23: 15 mg via ORAL
  Filled 2023-09-22: qty 1

## 2023-09-22 MED ORDER — GABAPENTIN 300 MG PO CAPS
300.0000 mg | ORAL_CAPSULE | Freq: Every day | ORAL | Status: DC
  Administered 2023-09-22 – 2023-10-01 (×10): 300 mg via ORAL
  Filled 2023-09-22 (×10): qty 1

## 2023-09-22 MED ORDER — POLYETHYLENE GLYCOL 3350 17 G PO PACK
17.0000 g | PACK | Freq: Every day | ORAL | Status: DC | PRN
  Administered 2023-10-01: 17 g via ORAL
  Filled 2023-09-22: qty 1

## 2023-09-22 MED ORDER — SENNOSIDES-DOCUSATE SODIUM 8.6-50 MG PO TABS
2.0000 | ORAL_TABLET | Freq: Two times a day (BID) | ORAL | Status: DC
  Administered 2023-09-22 – 2023-10-02 (×16): 2 via ORAL
  Filled 2023-09-22 (×19): qty 2

## 2023-09-22 MED ORDER — CELECOXIB 200 MG PO CAPS
200.0000 mg | ORAL_CAPSULE | Freq: Two times a day (BID) | ORAL | Status: AC
  Administered 2023-09-22 – 2023-09-25 (×6): 200 mg via ORAL
  Filled 2023-09-22 (×6): qty 1

## 2023-09-22 MED ORDER — LEVOTHYROXINE SODIUM 100 MCG PO TABS
100.0000 ug | ORAL_TABLET | Freq: Every day | ORAL | Status: DC
  Administered 2023-09-23 – 2023-10-02 (×9): 100 ug via ORAL
  Filled 2023-09-22 (×10): qty 1

## 2023-09-22 MED ORDER — ENOXAPARIN SODIUM 40 MG/0.4ML IJ SOSY
40.0000 mg | PREFILLED_SYRINGE | INTRAMUSCULAR | Status: DC
Start: 2023-09-23 — End: 2023-09-23
  Administered 2023-09-23: 40 mg via SUBCUTANEOUS
  Filled 2023-09-22: qty 0.4

## 2023-09-22 MED ORDER — MORPHINE SULFATE 15 MG PO TABS
15.0000 mg | ORAL_TABLET | ORAL | Status: DC | PRN
  Administered 2023-09-29: 15 mg via ORAL
  Filled 2023-09-22 (×2): qty 1

## 2023-09-22 MED ORDER — HYDROMORPHONE HCL 1 MG/ML IJ SOLN
1.0000 mg | INTRAMUSCULAR | Status: DC | PRN
  Filled 2023-09-22: qty 1

## 2023-09-22 NOTE — ED Notes (Signed)
Messaged provider - Patient requesting pain medication states that oxycodone does not help with pain and she is in a lot of pain and would like to see the provider.

## 2023-09-22 NOTE — Assessment & Plan Note (Signed)
C/w synthroid 100 mcg daily.

## 2023-09-22 NOTE — H&P (Signed)
History and Physical    Patient: Melanie Li WUJ:[id number] DOB: April 02, 1966 DOA: 09/22/2023 DOS: the patient was seen and examined on 09/22/2023 PCP: Jeani Sow, MD  Patient coming from:  Sent in by [facility].  Chief Complaint:  Chief Complaint  Patient presents with   Hip Pain   HPI: Melanie Li is a 58 y.o. female with medical history significant of hypothyroidism.  Patient reports not taking any medications at home to me.  Husband at the bedside.  Patient is a pretty active, this does play pickle ball at home.  Patient was apparently in her usual state of health around mid December when she reports slight injury to right hip area during playing pickle ball.  Patient received query intra-articular steroid injection.   10 days later developed an insidious onset of right hip pain worse with ambulation or weightbearing.  Since then the pain has progressed.  Now patient is apparently in a wheelchair at home.  Taking several pain medications including meloxicam at home.  There is no report of fever vomiting diarrhea.  No report of distal weakness.  Patient apparently had an MRI done a few days ago for her hip pain.  Unfortunately I do not have access to the MRI report.  MRI was discussed with the patient, patient reports that there was concern of infection in the right hip.  Patient was sent to [facility] long ER.  Orthopedics is requesting evaluation/hospitalization under hospitalist service.  Per orthopedic consult in chart.  MRI is showing concern for septic arthritis.  Patient is in uncontrolled pain in spite of getting 4 mg of morphine, noted to be hyperventilating.  Husband at the bedside Review of Systems: As mentioned in the history of present illness. All other systems reviewed and are negative. Past Medical History:  Diagnosis Date   Tachycardia    Thyroid disease    Past Surgical History:  Procedure Laterality Date   BUNIONECTOMY Left 2017   also had R bunionectomy in  1984 approx   VULVA SURGERY  2019   Social History:  reports that she has never smoked. She has never used smokeless tobacco. She reports current alcohol use. She reports that she does not use drugs.  Allergies  Allergen Reactions   Sulfa Antibiotics Hives, Rash and Other (See Comments)    "Rash covered the entire body"   Oxycodone Other (See Comments)    "Very INEFFECTIVE"   Conjugated Estrogens Rash    Family History  Problem Relation Age of Onset   Cancer Mother    Hypertension Mother    Asthma Daughter    Stroke Maternal Grandmother    Cancer Maternal Grandfather    Diabetes Paternal Grandmother    Cancer Paternal Grandfather    Hearing loss Paternal Grandfather     Prior to Admission medications   Medication Sig Start Date End Date Taking? Authorizing Provider  gabapentin (NEURONTIN) 300 MG capsule Take 300 mg by mouth at bedtime.   Yes [provider]  levothyroxine (SYNTHROID) 100 MCG tablet Take 100 mcg by mouth daily before breakfast. 11/28/21  Yes [provider]  meloxicam (MOBIC) 7.5 MG tablet Take 7.5 mg by mouth in the morning.   Yes [provider]  morphine (MSIR) 15 MG tablet Take 15 mg by mouth every 8 (eight) hours as needed for moderate pain (pain score 4-6) or severe pain (pain score 7-10).   Yes [provider]  TYLENOL 500 MG tablet Take 1,500 mg by mouth 2 (two) times  daily.   Yes [provider]  cyclobenzaprine (FLEXERIL) 10 MG tablet Take 1 tablet (10 mg total) by mouth at bedtime. Patient not taking: Reported on 09/22/2023 09/29/21   Jeani Sow, MD  naproxen (NAPROSYN) 500 MG tablet Take 1 tablet (500 mg total) by mouth 2 (two) times daily. Patient not taking: Reported on 09/22/2023 08/21/23   Sabas Sous, MD  oxyCODONE (ROXICODONE) 5 MG immediate release tablet Take 1 tablet (5 mg total) by mouth every 4 (four) hours as needed for severe pain (pain score 7-10). Patient not taking: Reported on 09/22/2023  08/21/23   Sabas Sous, MD    Physical Exam: Vitals:   09/22/23 1404 09/22/23 1752 09/22/23 1830 09/22/23 1848  BP: 109/73 122/76 131/79   Pulse: 95 88 85   Resp: 18 20 18    Temp: 98.4 F (36.9 C)   99.9 F (37.7 C)  TempSrc: Oral   Oral  SpO2: 100% 99% 100%   Weight:      Height:       General: Alert and awake appears to be in moderate/mild distress from the pain.  Otherwise no physiologic distress apparent. Respiratory exam: Bilateral intravesicular Cardiovascular exam S1-S2 normal Abdomen all quadrant soft nontender Extremities warm without edema.  Distal function intact in all extremities.  No direct tenderness over hip is appreciated.  I did not manipulate the hip because patient is in severe pain.  Patient maintains hip  in partial flexion. Data Reviewed:  Labs on Admission:  Results for orders placed or performed during the hospital encounter of 09/22/23 (from the past 24 hours)  CBC with Differential     Status: Abnormal   Collection Time: 09/22/23  1:26 PM  Result Value Ref Range   WBC 14.0 (H) 4.0 - 10.5 K/uL   RBC 3.17 (L) 3.87 - 5.11 MIL/uL   Hemoglobin 9.3 (L) 12.0 - 15.0 g/dL   HCT 57.8 (L) 46.9 - 62.9 %   MCV 90.5 80.0 - 100.0 fL   MCH 29.3 26.0 - 34.0 pg   MCHC 32.4 30.0 - 36.0 g/dL   RDW 52.8 41.3 - 24.4 %   Platelets 410 (H) 150 - 400 K/uL   nRBC 0.0 0.0 - 0.2 %   Neutrophils Relative % 78 %   Neutro Abs 10.8 (H) 1.7 - 7.7 K/uL   Lymphocytes Relative 12 %   Lymphs Abs 1.7 0.7 - 4.0 K/uL   Monocytes Relative 8 %   Monocytes Absolute 1.1 (H) 0.1 - 1.0 K/uL   Eosinophils Relative 0 %   Eosinophils Absolute 0.0 0.0 - 0.5 K/uL   Basophils Relative 0 %   Basophils Absolute 0.1 0.0 - 0.1 K/uL   Immature Granulocytes 2 %   Abs Immature Granulocytes 0.32 (H) 0.00 - 0.07 K/uL  Comprehensive metabolic panel     Status: Abnormal   Collection Time: 09/22/23  1:26 PM  Result Value Ref Range   Sodium 136 135 - 145 mmol/L   Potassium 3.7 3.5 - 5.1 mmol/L    Chloride 102 98 - 111 mmol/L   CO2 21 (L) 22 - 32 mmol/L   Glucose, Bld 108 (H) 70 - 99 mg/dL   BUN 13 6 - 20 mg/dL   Creatinine, Ser 0.10 0.44 - 1.00 mg/dL   Calcium 8.8 (L) 8.9 - 10.3 mg/dL   Total Protein 7.1 6.5 - 8.1 g/dL   Albumin 2.6 (L) 3.5 - 5.0 g/dL   AST 29 15 - 41 U/L  ALT 26 0 - 44 U/L   Alkaline Phosphatase 68 38 - 126 U/L   Total Bilirubin 0.9 0.0 - 1.2 mg/dL   GFR, Estimated >16 >10 mL/min   Anion gap 13 5 - 15  Lactic acid, plasma     Status: None   Collection Time: 09/22/23  4:50 PM  Result Value Ref Range   Lactic Acid, Venous 1.0 0.5 - 1.9 mmol/L  Sedimentation rate     Status: Abnormal   Collection Time: 09/22/23  5:07 PM  Result Value Ref Range   Sed Rate >140 (H) 0 - 22 mm/hr   Basic Metabolic Panel: Recent Labs  Lab 09/22/23 1326  NA 136  K 3.7  CL 102  CO2 21*  GLUCOSE 108*  BUN 13  CREATININE 0.76  CALCIUM 8.8*   Liver Function Tests: Recent Labs  Lab 09/22/23 1326  AST 29  ALT 26  ALKPHOS 68  BILITOT 0.9  PROT 7.1  ALBUMIN 2.6*   No results for input(s): "LIPASE", "AMYLASE" in the last 168 hours. No results for input(s): "AMMONIA" in the last 168 hours. CBC: Recent Labs  Lab 09/22/23 1326  WBC 14.0*  NEUTROABS 10.8*  HGB 9.3*  HCT 28.7*  MCV 90.5  PLT 410*   Cardiac Enzymes: No results for input(s): "CKTOTAL", "CKMB", "CKMBINDEX", "TROPONINIHS" in the last 168 hours.  BNP (last 3 results) No results for input(s): "PROBNP" in the last 8760 hours. CBG: No results for input(s): "GLUCAP" in the last 168 hours.  Radiological Exams on Admission:  DG Hip Unilat W or Wo Pelvis 2-3 Views Right Result Date: 09/22/2023 CLINICAL DATA:  Hip pain EXAM: DG HIP (WITH OR WITHOUT PELVIS) 3V RIGHT COMPARISON:  None Available. FINDINGS: Severe concentric joint space loss of the right hip. Minimal degenerative changes as well of the right sacroiliac joint. No fracture or dislocation. Preserved bone mineralization. Preserved left hip  joint. Presumed vascular calcifications in the pelvis. 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 Additional degenerative changes of the right sacroiliac joint. Electronically Signed   By: Karen Kays M.D.   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 Patient reports new pain starting about 10 days later worsenign since then.  Per Ortho consult, MRI right hip concerning for septic arthritis.  As well as periarticular abscesses. Will request IR sampling. I did discuss with Dr. Shon Baton about abx use and offer same to patient given spreading infection. Patient was advised by Dr. Linna Caprice to wait on abx till sampling is done. Dr. Shon Baton coroborated this. At this time, aptient wants to wait on abx per ortho recommendation.     Pain Patient in severe uncontrolled pain due to above.  Will order Dilaudid 0.5 mg every 2 hours as needed for severe breakthrough pain.  Will order standing morphine 15 mg SR twice daily.  As well as standing acetaminophen and Celebrex.  Morphine 15 mg p.o. every 4 hours as needed for moderate pain.  Patient does not respond well to oxycodone.  As per patient. Bowel regimen ordered  Hypothyroidism (acquired) C/w synthroid 100 mcg daily.      Advance Care Planning:   Code Status: Not on file full code.  Consults: ortho engaged as above.  Family Communication: husband at bedside. All quesitons answered.  Severity of Illness: The appropriate patient  status for this patient is INPATIENT. Inpatient status is judged to be reasonable and necessary in order to provide the required intensity of service to ensure the patient's safety. The patient's presenting symptoms, physical exam findings, and initial radiographic and laboratory data in the context of their chronic comorbidities is felt to place them  at high risk for further clinical deterioration. Furthermore, it is not anticipated that the patient will be medically stable for discharge from the hospital within 2 midnights of admission.   * I certify that at the point of admission it is my clinical judgment that the patient will require inpatient hospital care spanning beyond 2 midnights from the point of admission due to high intensity of service, high risk for further deterioration and high frequency of surveillance required.*  Author: Nolberto Hanlon, MD 09/22/2023 6:50 PM  For on call review [email or web address].

## 2023-09-22 NOTE — Consult Note (Addendum)
Reason for Consult:R hip pain possible septic arthritis Referring Physician:    Angila Wombles is an 58 y.o. female.  HPI: Progressively worsening debilitating R hip pain for nearly 1.5 months refractory to medications and epidural injection, intra-articular hip injection, steroids, now inability to ambulate. Stat MRI of the hip was obtained at [facility] concerning for septic arthritis. Pt seen by Dr. Linna Caprice today and was sent to ER for admission.   Past Medical History:  Diagnosis Date   Tachycardia    Thyroid disease     Past Surgical History:  Procedure Laterality Date   BUNIONECTOMY Left 2017   also had R bunionectomy in 1984 approx   VULVA SURGERY  2019    Family History  Problem Relation Age of Onset   Cancer Mother    Hypertension Mother    Asthma Daughter    Stroke Maternal Grandmother    Cancer Maternal Grandfather    Diabetes Paternal Grandmother    Cancer Paternal Grandfather    Hearing loss Paternal Grandfather     Social History:  reports that she has never smoked. She has never used smokeless tobacco. She reports current alcohol use. She reports that she does not use drugs.  Allergies:  Allergies  Allergen Reactions   Sulfa Antibiotics    Sulfacetamide-Prednisolone Other (See Comments)   Conjugated Estrogens Rash   Sulfamethoxazole Rash    Rash covering her entire body    Medications: I have reviewed the patient's current medications.  Results for orders placed or performed during the hospital encounter of 09/22/23 (from the past 48 hours)  CBC with Differential     Status: Abnormal   Collection Time: 09/22/23  1:26 PM  Result Value Ref Range   WBC 14.0 (H) 4.0 - 10.5 K/uL   RBC 3.17 (L) 3.87 - 5.11 MIL/uL   Hemoglobin 9.3 (L) 12.0 - 15.0 g/dL   HCT 16.1 (L) 09.6 - 04.5 %   MCV 90.5 80.0 - 100.0 fL   MCH 29.3 26.0 - 34.0 pg   MCHC 32.4 30.0 - 36.0 g/dL   RDW 40.9 81.1 - 91.4 %   Platelets 410 (H) 150 - 400 K/uL   nRBC 0.0 0.0 - 0.2 %   Neutrophils  Relative % 78 %   Neutro Abs 10.8 (H) 1.7 - 7.7 K/uL   Lymphocytes Relative 12 %   Lymphs Abs 1.7 0.7 - 4.0 K/uL   Monocytes Relative 8 %   Monocytes Absolute 1.1 (H) 0.1 - 1.0 K/uL   Eosinophils Relative 0 %   Eosinophils Absolute 0.0 0.0 - 0.5 K/uL   Basophils Relative 0 %   Basophils Absolute 0.1 0.0 - 0.1 K/uL   Immature Granulocytes 2 %   Abs Immature Granulocytes 0.32 (H) 0.00 - 0.07 K/uL    Comment: Performed at [facility], [address] [place], [place] [address]  Comprehensive metabolic panel     Status: Abnormal   Collection Time: 09/22/23  1:26 PM  Result Value Ref Range   Sodium 136 135 - 145 mmol/L   Potassium 3.7 3.5 - 5.1 mmol/L   Chloride 102 98 - 111 mmol/L   CO2 21 (L) 22 - 32 mmol/L   Glucose, Bld 108 (H) 70 - 99 mg/dL    Comment: Glucose reference range applies only to samples taken after fasting for at least 8 hours.   BUN 13 6 - 20 mg/dL   Creatinine, Ser 6.21 0.44 - 1.00 mg/dL   Calcium 8.8 (L) 8.9 -  10.3 mg/dL   Total Protein 7.1 6.5 - 8.1 g/dL   Albumin 2.6 (L) 3.5 - 5.0 g/dL   AST 29 15 - 41 U/L   ALT 26 0 - 44 U/L   Alkaline Phosphatase 68 38 - 126 U/L   Total Bilirubin 0.9 0.0 - 1.2 mg/dL   GFR, Estimated >09 >81 mL/min    Comment: (NOTE) Calculated using the CKD-EPI Creatinine Equation (2021)    Anion gap 13 5 - 15    Comment: Performed at [facility], [address], [place], [place] [address]    DG Hip Lucienne Capers or Wo Pelvis 2-3 Views Right Result Date: 09/22/2023 CLINICAL DATA:  Hip pain EXAM: DG HIP (WITH OR WITHOUT PELVIS) 3V RIGHT COMPARISON:  None Available. FINDINGS: Severe concentric joint space loss of the right hip. Minimal degenerative changes as well of the right sacroiliac joint. No fracture or dislocation. Preserved bone mineralization. Preserved left hip joint. Presumed vascular calcifications in the pelvis. 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 Additional degenerative  changes of the right sacroiliac joint. Electronically Signed   By: Karen Kays M.D.   On: 09/22/2023 12:48    Review of Systems  Constitutional: Negative.   HENT: Negative.    Eyes: Negative.   Respiratory: Negative.    Cardiovascular: Negative.   Gastrointestinal: Negative.   Endocrine: Negative.   Genitourinary: Negative.   Musculoskeletal:  Positive for arthralgias, gait problem and joint swelling.   Blood pressure 109/73, pulse 95, temperature 98.4 F (36.9 C), temperature source Oral, resp. rate 18, height 5\' 8"  (1.727 m), weight 70.3 kg, SpO2 100%. Physical Exam Constitutional:      General: She is in acute distress.  HENT:     Head: Normocephalic and atraumatic.     Right Ear: External ear normal.     Left Ear: External ear normal.     Nose: Nose normal.     Mouth/Throat:     Pharynx: Oropharynx is clear.  Eyes:     Conjunctiva/sclera: Conjunctivae normal.  Cardiovascular:     Rate and Rhythm: Normal rate.     Pulses: Normal pulses.  Pulmonary:     Effort: Pulmonary effort is normal.  Abdominal:     General: Abdomen is flat.  Musculoskeletal:     Cervical back: Normal range of motion.     Comments: No back pain with palpation or range of motion. No SI joint pain with palpation. Gentle internal or external rotation of the right hip elicits horrific pain consistent with her complaints.   Skin:    General: Skin is warm.   X-rays of the lumbar spine completed on 08/24/2023 demonstrated slight degenerative scoliosis. Loss of normal lordosis with degenerative disc disease L3-4 and L5-S1. No fractures seen.  Lumbar MRI: completed on 08/25/2023 was reviewed with the patient. It was completed at [facility]; I have independently reviewed the images as well as the radiology report. Multilevel degenerative changes throughout the thoracolumbar spine most noted at L3-4 and L5-S1. Degenerative levoscoliosis without central stenosis. Moderate left foraminal/far lateral disc  protrusion contacting the left L2 nerve root. Small left foraminal disc protrusion at L3-4. Mild left foraminal stenosis at L4-5. Degenerative disc disease L5-S1 with mild biforaminal stenosis.  Hip MRI done at [facility] on 09/19/23 with severe destructive/erosive arthropathy R hip concerning for septic arthrosis (RA less favored). Reactive bone marrow edema with diffuse full thickness cartilage loss, large effusion, multiple pockets of T1 hyperintense fluid in R adductor musculature and  ischiofemoral fossa concerning for peri-articular abscesses. Complete tear of hamstring tendons and advanced ischial bursitis (septic bursitis). Complete tear gluteus medius tendon. Partial tear rectus femoris tendon.  Assessment/Plan: Severe R hip pain possible septic arthritis  Plan per Dr. Philis Nettle. [place] Admit to hospitalist service Will require IR consult for aspiration, cultures to determine abx May also require I&D R hip  Dorothy Spark PA-C 09/22/2023, 4:45 PM   Addendum by Dr. Shon Baton, The patient was evaluated in my office and the MRI was completed on 09/19/2023.  At that time I did review the MRI with Dr. Linna Caprice and the plan was for having the patient follow-up with him as an outpatient today.  Patient was seen in the office and instructed to go to the emergency room for admission.  I spoke with Dr. Linna Caprice concerning his plan. Patient will be admitted to the hospitalist service. Appropriate lab work has been ordered. Plan on aspiration with interventional radiology.  Following the aspiration he had recommended antibiotics. Will defer further management to Dr. Linna Caprice.

## 2023-09-22 NOTE — ED Provider Triage Note (Signed)
Emergency Medicine Provider Triage Evaluation Note  Melanie Li , a 58 y.o. female  was evaluated in triage.  Pt complains of right hip pain. Pt saw Dr. Leroy Li today and was sent to ED for evaluation.  Pt reports she is suppose to be admitted.    Review of Systems  Positive: Pain  Negative: fever  Physical Exam  BP 115/71 (BP Location: Left Arm)   Pulse (!) 102   Temp 98 F (36.7 C)   Resp 18   Ht 5\' 8"  (1.727 m)   Wt 70.3 kg   LMP  (LMP Unknown)   SpO2 100%   BMI 23.57 kg/m  Gen:   Awake, no distress   Resp:  Normal effort  MSK:   Pain with movement   Medical Decision Making  Medically screening exam initiated at 12:54 PM.  Appropriate orders placed.  Melanie Li was informed that the remainder of the evaluation will be completed by another provider, this initial triage assessment does not replace that evaluation, and the importance of remaining in the ED until their evaluation is complete.     Melanie Li, [place] 09/22/23 1258

## 2023-09-22 NOTE — Assessment & Plan Note (Signed)
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 Patient reports new pain starting about 10 days later worsenign since then.  Per Ortho consult, MRI right hip concerning for septic arthritis.  As well as periarticular abscesses. Will request IR sampling. I did discuss with Dr. Shon Baton about abx use and offer same to patient given spreading infection. Patient was advised by Dr. Linna Caprice to wait on abx till sampling is done. Dr. Shon Baton coroborated this. At this time, aptient wants to wait on abx per ortho recommendation.

## 2023-09-22 NOTE — ED Triage Notes (Signed)
Patient sent by ortho. Patient reports worsening right hip pain x 1 month. Patient states they told her she has fluid around her hip. Also reports tingling going down her right leg.

## 2023-09-22 NOTE — Assessment & Plan Note (Signed)
Patient in severe uncontrolled pain due to above.  Will order Dilaudid 0.5 mg every 2 hours as needed for severe breakthrough pain.  Will order standing morphine 15 mg SR twice daily.  As well as standing acetaminophen and Celebrex.  Morphine 15 mg p.o. every 4 hours as needed for moderate pain.  Patient does not respond well to oxycodone.  As per patient. Bowel regimen ordered

## 2023-09-22 NOTE — ED Provider Notes (Signed)
[place] EMERGENCY DEPARTMENT AT [facility] Provider Note   CSN: [id number] Arrival date & time: 09/22/23  1126     History  Chief Complaint  Patient presents with   Hip Pain    Tammera Engert is a 58 y.o. female.  Patient with noncontributory past medical history presents today with complaints of left hip pain. She states that same began back in December and has been progressively worsening since then. States she has been following with orthopedics Dr. Linna Caprice for management of this.  She reportedly had an MRI of the hip at the beginning of this week and then saw Dr. Linna Caprice in the office earlier today and was reportedly sent here for admission given concern for 'infected fluid in my hip.' I am unable to review notes from orthopedics. She denies fevers, does note that she has had chills. She states that she has been using a wheelchair due to significant pain in the hip area.   The history is provided by the patient. No language interpreter was used.  Hip Pain       Home Medications Prior to Admission medications   Medication Sig Start Date End Date Taking? Authorizing Provider  cyclobenzaprine (FLEXERIL) 10 MG tablet Take 1 tablet (10 mg total) by mouth at bedtime. Patient taking differently: Take 10 mg by mouth 3 (three) times daily as needed for muscle spasms. 09/29/21   Jeani Sow, MD  levothyroxine (SYNTHROID) 100 MCG tablet Take 100 mcg by mouth every morning. 11/28/21   [provider]  naproxen (NAPROSYN) 500 MG tablet Take 1 tablet (500 mg total) by mouth 2 (two) times daily. 08/21/23   Sabas Sous, MD  oxyCODONE (ROXICODONE) 5 MG immediate release tablet Take 1 tablet (5 mg total) by mouth every 4 (four) hours as needed for severe pain (pain score 7-10). 08/21/23   Sabas Sous, MD      Allergies    Sulfa antibiotics, Sulfacetamide-prednisolone, Conjugated estrogens, and Sulfamethoxazole    Review of Systems   Review of Systems   Musculoskeletal:  Positive for arthralgias, gait problem and myalgias.  All other systems reviewed and are negative.   Physical Exam Updated Vital Signs BP 109/73   Pulse 95   Temp 98.4 F (36.9 C) (Oral)   Resp 18   Ht 5\' 8"  (1.727 m)   Wt 70.3 kg   LMP  (LMP Unknown)   SpO2 100%   BMI 23.57 kg/m  Physical Exam Vitals and nursing note reviewed.  Constitutional:      General: She is not in acute distress.    Appearance: Normal appearance. She is normal weight. She is not ill-appearing, toxic-appearing or diaphoretic.  HENT:     Head: Normocephalic and atraumatic.  Cardiovascular:     Rate and Rhythm: Normal rate.  Pulmonary:     Effort: Pulmonary effort is normal. No respiratory distress.  Musculoskeletal:        General: Normal range of motion.     Cervical back: Normal range of motion.     Comments: TTP noted throughout the left hip area. No overlying erythema or warmth. Good distal pulses and sensation. ROM not tested due to pain.  Skin:    General: Skin is warm and dry.  Neurological:     General: No focal deficit present.     Mental Status: She is alert.  Psychiatric:        Mood and Affect: Mood normal.  Behavior: Behavior normal.     ED Results / Procedures / Treatments   Labs (all labs ordered are listed, but only abnormal results are displayed) Labs Reviewed  CBC WITH DIFFERENTIAL/PLATELET - Abnormal; Notable for the following components:      Result Value   WBC 14.0 (*)    RBC 3.17 (*)    Hemoglobin 9.3 (*)    HCT 28.7 (*)    Platelets 410 (*)    Neutro Abs 10.8 (*)    Monocytes Absolute 1.1 (*)    Abs Immature Granulocytes 0.32 (*)    All other components within normal limits  COMPREHENSIVE METABOLIC PANEL - Abnormal; Notable for the following components:   CO2 21 (*)    Glucose, Bld 108 (*)    Calcium 8.8 (*)    Albumin 2.6 (*)    All other components within normal limits  CULTURE, BLOOD (ROUTINE X 2)  CULTURE, BLOOD (ROUTINE X 2)   LACTIC ACID, PLASMA  LACTIC ACID, PLASMA    EKG None  Radiology DG Hip Unilat W or Wo Pelvis 2-3 Views Right Result Date: 09/22/2023 CLINICAL DATA:  Hip pain EXAM: DG HIP (WITH OR WITHOUT PELVIS) 3V RIGHT COMPARISON:  None Available. FINDINGS: Severe concentric joint space loss of the right hip. Minimal degenerative changes as well of the right sacroiliac joint. No fracture or dislocation. Preserved bone mineralization. Preserved left hip joint. Presumed vascular calcifications in the pelvis. 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 Additional degenerative changes of the right sacroiliac joint. Electronically Signed   By: Karen Kays M.D.   On: 09/22/2023 12:48    Procedures Procedures    Medications Ordered in ED Medications  oxyCODONE-acetaminophen (PERCOCET/ROXICET) 5-325 MG per tablet 1 tablet (1 tablet Oral Given 09/22/23 1152)    ED Course/ Medical Decision Making/ A&P                                 Medical Decision Making Amount and/or Complexity of Data Reviewed Labs: ordered. Radiology: ordered.  Risk Prescription drug management.   This patient is a 58 y.o. female who presents to the ED for concern of left hip pain, this involves an extensive number of treatment options, and is a complaint that carries with it a high risk of complications and morbidity. The emergent differential diagnosis prior to evaluation includes, but is not limited to,  septic arthritis, inflammatory arthritis, trochanteric bursitis, muscle strain. This is not an exhaustive differential.   Past Medical History / Co-morbidities / Social History:  has a past medical history of Tachycardia and Thyroid disease.  Additional history: Chart reviewed. Pertinent results include: Unable to review notes from Dr. Linna Caprice  Physical Exam: Physical exam performed. The pertinent findings include: Per above, TTP noted to the left hip without overlying skin changes.  Good distal pulses and  sensation.  Lab Tests: I ordered, and personally interpreted labs.  The pertinent results include:  WBC 14, hgb 9.3 down from 12.6 1 month ago.    Imaging Studies: I ordered imaging studies including DG hip. 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 Additional degenerative changes of the right sacroiliac joint.  I agree with the radiologist interpretation.  Consultations Obtained: I requested consultation with the orthopedics on call Dr. Shon Baton,  and discussed lab and imaging findings as well as pertinent plan - they recommend: admit to medicine,  plan for IR drainage and culture, no antibiotics until the fluid has been cultured. They will see the patient in consultation    Disposition: After consideration of the diagnostic results and the patients response to treatment, I feel that patient will require admission per orthopedics recommendation for IR drainage of left hip with concern for septic arthiritis.  Discussed patient with hospitalist who accepts patient for admission.   Final Clinical Impression(s) / ED Diagnoses Final diagnoses:  Left hip pain    Rx / DC Orders ED Discharge Orders     None         Vear Clock 09/22/23 1651    Lorre Nick, MD 09/25/23 [phone]

## 2023-09-23 ENCOUNTER — Inpatient Hospital Stay (HOSPITAL_COMMUNITY): Payer: 59

## 2023-09-23 DIAGNOSIS — M009 Pyogenic arthritis, unspecified: Secondary | ICD-10-CM | POA: Diagnosis not present

## 2023-09-23 LAB — CREATININE, SERUM
Creatinine, Ser: 0.76 mg/dL (ref 0.44–1.00)
GFR, Estimated: >60 mL/min (ref >60)

## 2023-09-23 LAB — CBC
HCT: 24.3 % — ABNORMAL LOW (ref 36.0–46.0)
HCT: 28.7 % — ABNORMAL LOW (ref 36.0–46.0)
Hemoglobin: 7.8 g/dL — ABNORMAL LOW (ref 12.0–15.0)
Hemoglobin: 9.2 g/dL — ABNORMAL LOW (ref 12.0–15.0)
MCH: 29.7 pg (ref 26.0–34.0)
MCH: 30.4 pg (ref 26.0–34.0)
MCHC: 32.1 g/dL (ref 30.0–36.0)
MCHC: 32.1 g/dL (ref 30.0–36.0)
MCV: 92.6 fL (ref 80.0–100.0)
MCV: 94.6 fL (ref 80.0–100.0)
Platelets: 314 10*3/uL (ref 150–400)
Platelets: 367 10*3/uL (ref 150–400)
RBC: 2.57 MIL/uL — ABNORMAL LOW (ref 3.87–5.11)
RBC: 3.1 MIL/uL — ABNORMAL LOW (ref 3.87–5.11)
RDW: 12.4 % (ref 11.5–15.5)
RDW: 12.5 % (ref 11.5–15.5)
WBC: 10.9 10*3/uL — ABNORMAL HIGH (ref 4.0–10.5)
WBC: 12 10*3/uL — ABNORMAL HIGH (ref 4.0–10.5)
nRBC: 0 % (ref 0.0–0.2)
nRBC: 0 % (ref 0.0–0.2)

## 2023-09-23 LAB — BASIC METABOLIC PANEL
Anion gap: 9 (ref 5–15)
BUN: 14 mg/dL (ref 6–20)
CO2: 23 mmol/L (ref 22–32)
Calcium: 8.6 mg/dL — ABNORMAL LOW (ref 8.9–10.3)
Chloride: 104 mmol/L (ref 98–111)
Creatinine, Ser: 0.75 mg/dL (ref 0.44–1.00)
GFR, Estimated: >60 mL/min (ref >60)
Glucose, Bld: 101 mg/dL — ABNORMAL HIGH (ref 70–99)
Potassium: 3.4 mmol/L — ABNORMAL LOW (ref 3.5–5.1)
Sodium: 136 mmol/L (ref 135–145)

## 2023-09-23 LAB — BLOOD CULTURE ID PANEL (REFLEXED) - BCID2

## 2023-09-23 LAB — IRON AND TIBC
Iron: 19 ug/dL — ABNORMAL LOW (ref 28–170)
Saturation Ratios: 15 % (ref 10.4–31.8)
TIBC: 130 ug/dL — ABNORMAL LOW (ref 250–450)
UIBC: 111 ug/dL

## 2023-09-23 LAB — APTT: aPTT: 55 s — ABNORMAL HIGH (ref 24–36)

## 2023-09-23 LAB — FERRITIN: Ferritin: 903 ng/mL — ABNORMAL HIGH (ref 11–307)

## 2023-09-23 LAB — PROTIME-INR
INR: 1.3 — ABNORMAL HIGH (ref 0.8–1.2)
Prothrombin Time: 16.5 s — ABNORMAL HIGH (ref 11.4–15.2)

## 2023-09-23 LAB — MAGNESIUM: Magnesium: 2 mg/dL (ref 1.7–2.4)

## 2023-09-23 LAB — HIV ANTIBODY (ROUTINE TESTING W REFLEX): HIV Screen 4th Generation wRfx: NONREACTIVE

## 2023-09-23 LAB — HEMOGLOBIN AND HEMATOCRIT, BLOOD
HCT: 28.1 % — ABNORMAL LOW (ref 36.0–46.0)
Hemoglobin: 9.2 g/dL — ABNORMAL LOW (ref 12.0–15.0)

## 2023-09-23 MED ORDER — POTASSIUM CHLORIDE CRYS ER 20 MEQ PO TBCR
40.0000 meq | EXTENDED_RELEASE_TABLET | Freq: Two times a day (BID) | ORAL | Status: AC
  Administered 2023-09-23: 40 meq via ORAL
  Filled 2023-09-23: qty 2

## 2023-09-23 MED ORDER — FERROUS FUMARATE 324 (106 FE) MG PO TABS
1.0000 | ORAL_TABLET | Freq: Every day | ORAL | Status: DC
  Administered 2023-09-23 – 2023-10-02 (×10): 106 mg via ORAL
  Filled 2023-09-23 (×10): qty 1

## 2023-09-23 MED ORDER — CEFAZOLIN SODIUM-DEXTROSE 2-4 GM/100ML-% IV SOLN
2.0000 g | Freq: Three times a day (TID) | INTRAVENOUS | Status: DC
  Administered 2023-09-23 – 2023-10-02 (×27): 2 g via INTRAVENOUS
  Filled 2023-09-23 (×28): qty 100

## 2023-09-23 MED ORDER — VANCOMYCIN HCL 1500 MG/300ML IV SOLN
1500.0000 mg | Freq: Once | INTRAVENOUS | Status: DC
  Filled 2023-09-23: qty 300

## 2023-09-23 MED ORDER — SODIUM CHLORIDE 0.9 % IV SOLN
2.0000 g | Freq: Every day | INTRAVENOUS | Status: DC
  Administered 2023-09-23: 2 g via INTRAVENOUS
  Filled 2023-09-23: qty 20

## 2023-09-23 NOTE — Progress Notes (Addendum)
PHARMACY - PHYSICIAN COMMUNICATION CRITICAL VALUE ALERT - BLOOD CULTURE IDENTIFICATION (BCID)  Melanie Li is an 58 y.o. female who presented to [facility] on 09/22/2023 with a chief complaint of hip pain s/p injury during pickle ball.   Assessment:  MRI showing concern for septic arthritis.  BCID + 3/4 GPC identified as staph aureus (no methicillin resistance)  Name of physician (or Provider) Contacted: Dr. Jacqulyn Bath as well as Dr. Elinor Parkinson (since ID is auto-consulted)  Current antibiotics: Ceftriaxone + vancomycin  Changes to prescribed antibiotics recommended: Narrow to cefazolin. ID to evaluate patient and make any necessary antibiotic adjustments. No changes at this time.   Results for orders placed or performed during the hospital encounter of 09/22/23  Blood Culture ID Panel (Reflexed) (Collected: 09/22/2023  4:50 PM)  Result Value Ref Range   Enterococcus faecalis NOT DETECTED NOT DETECTED   Enterococcus Faecium NOT DETECTED NOT DETECTED   Listeria monocytogenes NOT DETECTED NOT DETECTED   Staphylococcus species DETECTED (A) NOT DETECTED   Staphylococcus aureus (BCID) DETECTED (A) NOT DETECTED   Staphylococcus epidermidis NOT DETECTED NOT DETECTED   Staphylococcus lugdunensis NOT DETECTED NOT DETECTED   Streptococcus species NOT DETECTED NOT DETECTED   Streptococcus agalactiae NOT DETECTED NOT DETECTED   Streptococcus pneumoniae NOT DETECTED NOT DETECTED   Streptococcus pyogenes NOT DETECTED NOT DETECTED   A.calcoaceticus-baumannii NOT DETECTED NOT DETECTED   Bacteroides fragilis NOT DETECTED NOT DETECTED   Enterobacterales NOT DETECTED NOT DETECTED   Enterobacter cloacae complex NOT DETECTED NOT DETECTED   Escherichia coli NOT DETECTED NOT DETECTED   Klebsiella aerogenes NOT DETECTED NOT DETECTED   Klebsiella oxytoca NOT DETECTED NOT DETECTED   Klebsiella pneumoniae NOT DETECTED NOT DETECTED   Proteus species NOT DETECTED NOT DETECTED   Salmonella species NOT DETECTED  NOT DETECTED   Serratia marcescens NOT DETECTED NOT DETECTED   Haemophilus influenzae NOT DETECTED NOT DETECTED   Neisseria meningitidis NOT DETECTED NOT DETECTED   Pseudomonas aeruginosa NOT DETECTED NOT DETECTED   Stenotrophomonas maltophilia NOT DETECTED NOT DETECTED   Candida albicans NOT DETECTED NOT DETECTED   Candida auris NOT DETECTED NOT DETECTED   Candida glabrata NOT DETECTED NOT DETECTED   Candida krusei NOT DETECTED NOT DETECTED   Candida parapsilosis NOT DETECTED NOT DETECTED   Candida tropicalis NOT DETECTED NOT DETECTED   Cryptococcus neoformans/gattii NOT DETECTED NOT DETECTED   Meth resistant mecA/C and MREJ NOT DETECTED NOT DETECTED    Cindi Carbon, PharmD 09/23/2023  11:37 AM

## 2023-09-23 NOTE — Procedures (Signed)
Radiology Procedure Note  Risks and benefits of joint injection were discussed with the patient including, but not limited to bleeding, infection, damage to adjacent structures, and low yield.    All of the patient's questions were answered, patient is agreeable to proceed. Consent signed and in chart.  A timeout was performed with all members of the team prior to start of the procedure. Correct patient and correct procedure was confirmed. Allergies were reviewed.   PROCEDURE SUMMARY:  Successful fluoro guided right hip aspiration.  2 mL of  purulent fluid was obtained. Fluid was sent for labs per request.   No immediate complications.  Patient tolerated well.   EBL = trace  Please see full dictation in imaging section of Epic for procedure details.  Lynann Bologna Issa Luster PA-C 09/23/2023 10:06 AM

## 2023-09-23 NOTE — Progress Notes (Signed)
PROGRESS NOTE    Melanie Li  ZOX:[id number] DOB: 08-31-1965 DOA: 09/22/2023 PCP: Jeani Sow, MD   Brief Narrative:  Pleasant 58 year old with no significant past medical history presented to ER with worsening right hip pain for nearly 1-1/37-month refractory to medications and intra-articular injection, now inability to ambulate.  Stat MRI of the hip was obtained at [facility] concerning for septic arthritis.  Patient was seen by Dr. Linna Caprice in the office and was recommended ER for admission.   Assessment & Plan:   Right hip septic arthritis: -Status post IR guided joint aspiration on 2/1.  2 mL of purulent fluid was obtained.  Fluid was sent for labs -Blood culture positive for gram-positive cocci in clusters. -Ortho consulted. -Continue as needed pain medications. -Start vancomycin and Rocephin  Hypothyroidism: Continue levothyroxine  Acute anemia: -Baseline H&H: 12, trended down to 7.8 this morning.  Will check iron panel.  Will hold on Lovenox.  Monitor H&H closely and transfuse as needed.  Patient denies melena.  She had colonoscopy more than 10 years ago at [place] that was negative.  DVT prophylaxis: SCD Code Status: Full code Family Communication:  None present at bedside.  Plan of care discussed with patient in length and he verbalized understanding and agreed with it. Disposition Plan: To be determined  Consultants:  IR Ortho  Procedures:  Joint aspiration  Antimicrobials:  Vancomycin. Rocephin  Status is: Inpatient     Subjective: Patient seen and examined.  Reports right hip pain with movement, not feeling well overall.  No fever, chills.  Objective: Vitals:   09/23/23 0450 09/23/23 0454 09/23/23 0700 09/23/23 1035  BP: 100/77  94/69 121/87  Pulse: 67  62 79  Resp: 16  16 16   Temp:  97.7 F (36.5 C)  97.6 F (36.4 C)  TempSrc:  Oral  Oral  SpO2: 99%  96% 97%  Weight:      Height:       No intake or output data in the 24 hours  ending 09/23/23 1051 Filed Weights   09/22/23 1145  Weight: 70.3 kg    Examination:  General exam: Appears calm and comfortable, on room air, communicating well, appears sick Respiratory system: Clear to auscultation. Respiratory effort normal. Cardiovascular system: S1 & S2 heard, RRR. No JVD, murmurs, rubs, gallops or clicks. No pedal edema. Gastrointestinal system: Abdomen is nondistended, soft and nontender. No organomegaly or masses felt. Normal bowel sounds heard. Central nervous system: Alert and oriented. No focal neurological deficits. Extremities: Right hip: Not erythematous, not swelling.  Pain aggravates with movement.   Skin: No rashes, lesions or ulcers Psychiatry: Judgement and insight appear normal. Mood & affect appropriate.    Data Reviewed: I have personally reviewed following labs and imaging studies  CBC: Recent Labs  Lab 09/22/23 0023 09/22/23 1326 09/23/23 0455  WBC 12.0* 14.0* 10.9*  NEUTROABS  --  10.8*  --   HGB 9.2* 9.3* 7.8*  HCT 28.7* 28.7* 24.3*  MCV 92.6 90.5 94.6  PLT 367 410* 314   Basic Metabolic Panel: Recent Labs  Lab 09/22/23 0023 09/22/23 1326 09/23/23 0455  NA  --  136 136  K  --  3.7 3.4*  CL  --  102 104  CO2  --  21* 23  GLUCOSE  --  108* 101*  BUN  --  13 14  CREATININE 0.76 0.76 0.75  CALCIUM  --  8.8* 8.6*  MG  --   --  2.0   GFR: Estimated  Creatinine Clearance: 76.4 mL/min (by C-G formula based on SCr of 0.75 mg/dL). Liver Function Tests: Recent Labs  Lab 09/22/23 1326  AST 29  ALT 26  ALKPHOS 68  BILITOT 0.9  PROT 7.1  ALBUMIN 2.6*   No results for input(s): "LIPASE", "AMYLASE" in the last 168 hours. No results for input(s): "AMMONIA" in the last 168 hours. Coagulation Profile: Recent Labs  Lab 09/23/23 0455  INR 1.3*   Cardiac Enzymes: No results for input(s): "CKTOTAL", "CKMB", "CKMBINDEX", "TROPONINI" in the last 168 hours. BNP (last 3 results) No results for input(s): "PROBNP" in the last 8760  hours. HbA1C: No results for input(s): "HGBA1C" in the last 72 hours. CBG: No results for input(s): "GLUCAP" in the last 168 hours. Lipid Profile: No results for input(s): "CHOL", "HDL", "LDLCALC", "TRIG", "CHOLHDL", "LDLDIRECT" in the last 72 hours. Thyroid Function Tests: No results for input(s): "TSH", "T4TOTAL", "FREET4", "T3FREE", "THYROIDAB" in the last 72 hours. Anemia Panel: No results for input(s): "VITAMINB12", "FOLATE", "FERRITIN", "TIBC", "IRON", "RETICCTPCT" in the last 72 hours. Sepsis Labs: Recent Labs  Lab 09/22/23 1650  LATICACIDVEN 1.0    Recent Results (from the past 240 hours)  Blood culture (routine x 2)     Status: None (Preliminary result)   Collection Time: 09/22/23  4:50 PM   Specimen: BLOOD  Result Value Ref Range Status   Specimen Description   Final    BLOOD LEFT ANTECUBITAL Performed at [facility], [address], [place], [place] [address]    Special Requests   Final    BOTTLES DRAWN AEROBIC AND ANAEROBIC Blood Culture adequate volume Performed at [facility], [address], [place], [place] [address]    Culture  Setup Time   Final    GRAM POSITIVE COCCI IN CLUSTERS AEROBIC BOTTLE ONLY Organism ID to follow Performed at [facility], [address] [place], [place] [address]    Culture GRAM POSITIVE COCCI  Final   Report Status PENDING  Incomplete  Blood culture (routine x 2)     Status: None (Preliminary result)   Collection Time: 09/22/23  5:07 PM   Specimen: BLOOD  Result Value Ref Range Status   Specimen Description   Final    BLOOD RIGHT ANTECUBITAL Performed at [facility], [address] [place], [place] [address]    Special Requests   Final    BOTTLES DRAWN AEROBIC AND ANAEROBIC Blood Culture adequate volume Performed at [facility], [address] [place], [place] [address]    Culture   Final    NO GROWTH < 24 HOURS Performed at [facility], [address] [place], [place] [address]    Report Status PENDING  Incomplete      Radiology Studies: DG Hip Unilat W or Wo Pelvis 2-3 Views Right Result Date: 09/22/2023 CLINICAL DATA:  Hip pain EXAM: DG HIP (WITH OR WITHOUT PELVIS) 3V RIGHT COMPARISON:  None Available. FINDINGS: Severe concentric joint space loss of the right hip. Minimal degenerative changes as well of the right sacroiliac joint. No fracture or dislocation. Preserved bone mineralization. Preserved left hip joint. Presumed vascular calcifications in the pelvis. 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 Additional degenerative changes of the right sacroiliac joint. Electronically Signed   By: Karen Kays M.D.   On: 09/22/2023 12:48    Scheduled Meds:  acetaminophen  650 mg Oral Q4H   celecoxib  200 mg Oral BID  enoxaparin (LOVENOX) injection  40 mg Subcutaneous Q24H   gabapentin  300 mg Oral QHS   levothyroxine  100 mcg Oral QAC breakfast   morphine  15 mg Oral Q12H   senna-docusate  2 tablet Oral BID   sodium chloride flush  3 mL Intravenous Q12H   Continuous Infusions:   LOS: 1 day   Time spent: 35 minutes   Zayonna Ayuso Estill Cotta, MD [facility]  If 7PM-7AM, please contact night-coverage [email or web address] 09/23/2023, 10:51 AM

## 2023-09-23 NOTE — Plan of Care (Signed)

## 2023-09-23 NOTE — ED Notes (Addendum)
ED TO INPATIENT HANDOFF REPORT  ED Nurse Name and Phone #: Suann Larry Name/Age/Gender Melanie Li 58 y.o. female Room/Bed: WA01/WA01  Code Status   Code Status: Full Code  Home/SNF/Other Home Patient oriented to: self, place, time, and situation Is this baseline? Yes   Triage Complete: Triage complete  Chief Complaint Septic arthritis [facility]) [M00.9]  Triage Note Patient sent by ortho. Patient reports worsening right hip pain x 1 month. Patient states they told her she has fluid around her hip. Also reports tingling going down her right leg.    Allergies Allergies  Allergen Reactions   Sulfa Antibiotics Hives, Rash and Other (See Comments)    "Rash covered the entire body"   Oxycodone Other (See Comments)    "Very INEFFECTIVE"   Conjugated Estrogens Rash    Level of Care/Admitting Diagnosis ED Disposition     ED Disposition  Admit   Condition  --   Comment  Hospital Area: [facility] [[id number]]  Level of Care: Telemetry [5]  Admit to tele based on following criteria: Eval of Syncope  May admit patient to [facility] or [facility] if equivalent level of care is available:: No  Covid Evaluation: Asymptomatic - no recent exposure (last 10 days) testing not required  Diagnosis: Septic arthritis [facility]) [[id number]]  Admitting Physician: Nolberto Hanlon [[id number]]  Attending Physician: Nolberto Hanlon [[id number]]  Certification:: I certify this patient will need inpatient services for at least 2 midnights  Expected Medical Readiness: 09/24/2023          B Medical/Surgery History Past Medical History:  Diagnosis Date   Tachycardia    Thyroid disease    Past Surgical History:  Procedure Laterality Date   BUNIONECTOMY Left 2017   also had R bunionectomy in 1984 approx   VULVA SURGERY  2019     A IV Location/Drains/Wounds Patient Lines/Drains/Airways Status     Active Line/Drains/Airways     Name Placement date Placement time Site Days    Peripheral IV 09/22/23 20 G Left Antecubital 09/22/23  1644  Antecubital  1            Intake/Output Last 24 hours No intake or output data in the 24 hours ending 09/23/23 1302  Labs/Imaging Results for orders placed or performed during the hospital encounter of 09/22/23 (from the past 48 hours)  CBC     Status: Abnormal   Collection Time: 09/22/23 12:23 AM  Result Value Ref Range   WBC 12.0 (H) 4.0 - 10.5 K/uL   RBC 3.10 (L) 3.87 - 5.11 MIL/uL   Hemoglobin 9.2 (L) 12.0 - 15.0 g/dL   HCT 72.5 (L) 36.6 - 44.0 %   MCV 92.6 80.0 - 100.0 fL   MCH 29.7 26.0 - 34.0 pg   MCHC 32.1 30.0 - 36.0 g/dL   RDW 34.7 42.5 - 95.6 %   Platelets 367 150 - 400 K/uL   nRBC 0.0 0.0 - 0.2 %    Comment: Performed at [facility], [address] [place], [place] [address]  Creatinine, serum     Status: None   Collection Time: 09/22/23 12:23 AM  Result Value Ref Range   Creatinine, Ser 0.76 0.44 - 1.00 mg/dL   GFR, Estimated >43 >32 mL/min    Comment: (NOTE) Calculated using the CKD-EPI Creatinine Equation (2021) Performed at [facility], [address] [place], [place] [address]   HIV Antibody (routine testing w rflx)     Status: None  Collection Time: 09/22/23 12:23 AM  Result Value Ref Range   HIV Screen 4th Generation wRfx Non Reactive Non Reactive    Comment: Performed at [facility], [address] [place], [place] [address]  CBC with Differential     Status: Abnormal   Collection Time: 09/22/23  1:26 PM  Result Value Ref Range   WBC 14.0 (H) 4.0 - 10.5 K/uL   RBC 3.17 (L) 3.87 - 5.11 MIL/uL   Hemoglobin 9.3 (L) 12.0 - 15.0 g/dL   HCT 60.4 (L) 54.0 - 98.1 %   MCV 90.5 80.0 - 100.0 fL   MCH 29.3 26.0 - 34.0 pg   MCHC 32.4 30.0 - 36.0 g/dL   RDW 19.1 47.8 - 29.5 %   Platelets 410 (H) 150 - 400 K/uL   nRBC 0.0 0.0 - 0.2 %   Neutrophils Relative % 78 %   Neutro Abs 10.8 (H) 1.7 - 7.7 K/uL   Lymphocytes Relative 12 %   Lymphs Abs 1.7  0.7 - 4.0 K/uL   Monocytes Relative 8 %   Monocytes Absolute 1.1 (H) 0.1 - 1.0 K/uL   Eosinophils Relative 0 %   Eosinophils Absolute 0.0 0.0 - 0.5 K/uL   Basophils Relative 0 %   Basophils Absolute 0.1 0.0 - 0.1 K/uL   Immature Granulocytes 2 %   Abs Immature Granulocytes 0.32 (H) 0.00 - 0.07 K/uL    Comment: Performed at [facility], [address] [place], [place] [address]  Comprehensive metabolic panel     Status: Abnormal   Collection Time: 09/22/23  1:26 PM  Result Value Ref Range   Sodium 136 135 - 145 mmol/L   Potassium 3.7 3.5 - 5.1 mmol/L   Chloride 102 98 - 111 mmol/L   CO2 21 (L) 22 - 32 mmol/L   Glucose, Bld 108 (H) 70 - 99 mg/dL    Comment: Glucose reference range applies only to samples taken after fasting for at least 8 hours.   BUN 13 6 - 20 mg/dL   Creatinine, Ser 8.65 0.44 - 1.00 mg/dL   Calcium 8.8 (L) 8.9 - 10.3 mg/dL   Total Protein 7.1 6.5 - 8.1 g/dL   Albumin 2.6 (L) 3.5 - 5.0 g/dL   AST 29 15 - 41 U/L   ALT 26 0 - 44 U/L   Alkaline Phosphatase 68 38 - 126 U/L   Total Bilirubin 0.9 0.0 - 1.2 mg/dL   GFR, Estimated >78 >46 mL/min    Comment: (NOTE) Calculated using the CKD-EPI Creatinine Equation (2021)    Anion gap 13 5 - 15    Comment: Performed at [facility], [address], [place], [place] [address]  Lactic acid, plasma     Status: None   Collection Time: 09/22/23  4:50 PM  Result Value Ref Range   Lactic Acid, Venous 1.0 0.5 - 1.9 mmol/L    Comment: Performed at [facility], [address], [place], [place] [address]  Blood culture (routine x 2)     Status: None (Preliminary result)   Collection Time: 09/22/23  4:50 PM   Specimen: BLOOD  Result Value Ref Range   Specimen Description      BLOOD LEFT ANTECUBITAL Performed at [facility], [address], [place], [place] [address]    Special Requests      BOTTLES DRAWN AEROBIC AND ANAEROBIC Blood Culture  adequate volume Performed at [facility], [address] [place], [place] [address]    Culture  Setup Time      GRAM POSITIVE COCCI IN CLUSTERS IN BOTH AEROBIC AND ANAEROBIC BOTTLES CRITICAL RESULT CALLED TO, READ BACK BY AND VERIFIED WITH: [facility] MARY SWAYNE [phone] AT 1134 BY EC CRITICAL VALUE NOTED.  VALUE IS CONSISTENT WITH PREVIOUSLY REPORTED AND CALLED VALUE. Performed at [facility], [address], [place], [place] [address]    Culture GRAM POSITIVE COCCI    Report Status PENDING   Blood Culture ID Panel (Reflexed)     Status: Abnormal   Collection Time: 09/22/23  4:50 PM  Result Value Ref Range   Enterococcus faecalis NOT DETECTED NOT DETECTED   Enterococcus Faecium NOT DETECTED NOT DETECTED   Listeria monocytogenes NOT DETECTED NOT DETECTED   Staphylococcus species DETECTED (A) NOT DETECTED    Comment: CRITICAL RESULT CALLED TO, READ BACK BY AND VERIFIED WITH: PHARMD MARY SWAYNE [phone] AT 1134 BY EC    Staphylococcus aureus (BCID) DETECTED (A) NOT DETECTED    Comment: CRITICAL RESULT CALLED TO, READ BACK BY AND VERIFIED WITH: PHARMD MARY SWAYNE [phone] AT 1134 BY EC    Staphylococcus epidermidis NOT DETECTED NOT DETECTED   Staphylococcus lugdunensis NOT DETECTED NOT DETECTED   Streptococcus species NOT DETECTED NOT DETECTED   Streptococcus agalactiae NOT DETECTED NOT DETECTED   Streptococcus pneumoniae NOT DETECTED NOT DETECTED   Streptococcus pyogenes NOT DETECTED NOT DETECTED   A.calcoaceticus-baumannii NOT DETECTED NOT DETECTED   Bacteroides fragilis NOT DETECTED NOT DETECTED   Enterobacterales NOT DETECTED NOT DETECTED   Enterobacter cloacae complex NOT DETECTED NOT DETECTED   Escherichia coli NOT DETECTED NOT DETECTED   Klebsiella aerogenes NOT DETECTED NOT DETECTED   Klebsiella oxytoca NOT DETECTED NOT DETECTED   Klebsiella pneumoniae NOT DETECTED NOT DETECTED   Proteus species NOT DETECTED NOT DETECTED   Salmonella species NOT  DETECTED NOT DETECTED   Serratia marcescens NOT DETECTED NOT DETECTED   Haemophilus influenzae NOT DETECTED NOT DETECTED   Neisseria meningitidis NOT DETECTED NOT DETECTED   Pseudomonas aeruginosa NOT DETECTED NOT DETECTED   Stenotrophomonas maltophilia NOT DETECTED NOT DETECTED   Candida albicans NOT DETECTED NOT DETECTED   Candida auris NOT DETECTED NOT DETECTED   Candida glabrata NOT DETECTED NOT DETECTED   Candida krusei NOT DETECTED NOT DETECTED   Candida parapsilosis NOT DETECTED NOT DETECTED   Candida tropicalis NOT DETECTED NOT DETECTED   Cryptococcus neoformans/gattii NOT DETECTED NOT DETECTED   Meth resistant mecA/C and MREJ NOT DETECTED NOT DETECTED    Comment: Performed at [facility], [address] [place], [place] [address]  Blood culture (routine x 2)     Status: None (Preliminary result)   Collection Time: 09/22/23  5:07 PM   Specimen: BLOOD  Result Value Ref Range   Specimen Description      BLOOD RIGHT ANTECUBITAL Performed at [facility], [address], [place], [place] [address]    Special Requests      BOTTLES DRAWN AEROBIC AND ANAEROBIC Blood Culture adequate volume Performed at [facility], [address], [place], [place] [address]    Culture  Setup Time      GRAM POSITIVE COCCI IN CLUSTERS IN BOTH AEROBIC AND ANAEROBIC BOTTLES CRITICAL VALUE NOTED.  VALUE IS CONSISTENT WITH PREVIOUSLY REPORTED AND CALLED VALUE. Performed at [facility], [address] [place], [place] [address]    Culture GRAM POSITIVE COCCI    Report Status  PENDING   C-reactive protein     Status: Abnormal   Collection Time: 09/22/23  5:07 PM  Result Value Ref Range   CRP 24.1 (H) <1.0 mg/dL    Comment: Performed at [facility], [address] [place], [place] [address]  Sedimentation rate     Status: Abnormal   Collection Time: 09/22/23  5:07 PM  Result Value Ref Range   Sed Rate >140 (H) 0 - 22 mm/hr    Comment:  Performed at [facility], [address] [place], [place] [address]  APTT     Status: Abnormal   Collection Time: 09/23/23  4:55 AM  Result Value Ref Range   aPTT 55 (H) 24 - 36 seconds    Comment:        IF BASELINE aPTT IS ELEVATED, SUGGEST PATIENT RISK ASSESSMENT BE USED TO DETERMINE APPROPRIATE ANTICOAGULANT THERAPY. Performed at [facility], [address], [place], [place] [address]   Protime-INR     Status: Abnormal   Collection Time: 09/23/23  4:55 AM  Result Value Ref Range   Prothrombin Time 16.5 (H) 11.4 - 15.2 seconds   INR 1.3 (H) 0.8 - 1.2    Comment: (NOTE) INR goal varies based on device and disease states. Performed at [facility], [address], [place], [place] [address]   Basic metabolic panel     Status: Abnormal   Collection Time: 09/23/23  4:55 AM  Result Value Ref Range   Sodium 136 135 - 145 mmol/L   Potassium 3.4 (L) 3.5 - 5.1 mmol/L   Chloride 104 98 - 111 mmol/L   CO2 23 22 - 32 mmol/L   Glucose, Bld 101 (H) 70 - 99 mg/dL    Comment: Glucose reference range applies only to samples taken after fasting for at least 8 hours.   BUN 14 6 - 20 mg/dL   Creatinine, Ser 2.95 0.44 - 1.00 mg/dL   Calcium 8.6 (L) 8.9 - 10.3 mg/dL   GFR, Estimated >62 >13 mL/min    Comment: (NOTE) Calculated using the CKD-EPI Creatinine Equation (2021)    Anion gap 9 5 - 15    Comment: Performed at [facility], [address], [place], [place] [address]  CBC     Status: Abnormal   Collection Time: 09/23/23  4:55 AM  Result Value Ref Range   WBC 10.9 (H) 4.0 - 10.5 K/uL   RBC 2.57 (L) 3.87 - 5.11 MIL/uL   Hemoglobin 7.8 (L) 12.0 - 15.0 g/dL   HCT 84.6 (L) 96.2 - 95.2 %   MCV 94.6 80.0 - 100.0 fL   MCH 30.4 26.0 - 34.0 pg   MCHC 32.1 30.0 - 36.0 g/dL   RDW 84.1 32.4 - 40.1 %   Platelets 314 150 - 400 K/uL   nRBC 0.0 0.0 - 0.2 %    Comment: Performed at [facility],  [address], [place], [place] [address]  Magnesium     Status: None   Collection Time: 09/23/23  4:55 AM  Result Value Ref Range   Magnesium 2.0 1.7 - 2.4 mg/dL    Comment: Performed at [facility], [address] [place], [place] [address]   DG Horace Porteous GUIDED NEEDLE [facility] ASPIRATION/INJECTION LOC Result Date: 09/23/2023 INDICATION: 58 year old female with right hip pain due to injury status post intra-articular steroid injection presents with acute right hip pain. MR at OSH raised concern for  septic arthritis. Request for fluoro guided right hip aspiration. EXAM: ASPIRATION OF RIGHT HIP JOINT UNDER FLUOROSCOPIC GUIDANCE COMPARISON:  None Available. FLUOROSCOPY TIME:  Radiation Exposure Index:  0.6 mGy COMPLICATIONS: None immediate. PROCEDURE: Informed written consent was obtained from the patient after discussion of the risks, benefits and alternatives to treatment. The patient was placed supine on the fluoroscopy table and the right extremity was placed in a slight degree of internal rotation. The right hip was localized with fluoroscopy. The skin overlying the anterior aspect of the hip was prepped and draped in usual sterile fashion. A 3.5 inch 18 gauge spinal needle was advanced into the hip joint at the lateral aspect of the femoral head-neck junction, after the overlying soft tissues were anesthetized with 1% lidocaine. A fluoroscopic image was saved and sent to PACs. 2 mL of purulent fluid was aspirated without difficulty. The sample was sent for laboratory. The needle was removed and a dressing was placed. The patient tolerated procedure well without immediate postprocedural complication. IMPRESSION: Successful fluoroscopic guided aspiration of the right hip. This procedure was performed by Lawernce Ion, PA-C under the supervision of Richarda Overlie, MD. Electronically Signed   By: Richarda Overlie M.D.   On: 09/23/2023 10:32   DG Hip Unilat W or Wo Pelvis 2-3 Views Right Result Date:  09/22/2023 CLINICAL DATA:  Hip pain EXAM: DG HIP (WITH OR WITHOUT PELVIS) 3V RIGHT COMPARISON:  None Available. FINDINGS: Severe concentric joint space loss of the right hip. Minimal degenerative changes as well of the right sacroiliac joint. No fracture or dislocation. Preserved bone mineralization. Preserved left hip joint. Presumed vascular calcifications in the pelvis. 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 Additional degenerative changes of the right sacroiliac joint. Electronically Signed   By: Karen Kays M.D.   On: 09/22/2023 12:48    Pending Labs Unresulted Labs (From admission, onward)     Start     Ordered   09/24/23 0500  CBC  Tomorrow morning,   R        09/23/23 0751   09/24/23 0500  Basic metabolic panel  Tomorrow morning,   R        09/23/23 0751   09/24/23 0500  C-reactive protein  Tomorrow morning,   R        09/23/23 1114   09/24/23 0500  Sedimentation rate  Tomorrow morning,   R        09/23/23 1114   09/23/23 1200  Hemoglobin and hematocrit, blood  Once,   R        09/23/23 0754   09/23/23 1130  Iron and TIBC  Once,   R        09/23/23 1130   09/23/23 1020  Glucose, Body Fluid Other  RELEASE UPON ORDERING,   STAT        09/23/23 1020   09/23/23 1020  Protein, body fluid (other)  RELEASE UPON ORDERING,   STAT        09/23/23 1020   09/23/23 1020  Anaerobic culture w Gram Stain  RELEASE UPON ORDERING,   STAT        09/23/23 1020            Vitals/Pain Today's Vitals   09/23/23 0454 09/23/23 0700 09/23/23 0805 09/23/23 1035  BP:  94/69  121/87  Pulse:  62  79  Resp:  16  16  Temp: 97.7 F (36.5 C)   97.6 F (36.4 C)  TempSrc: Oral  Oral  SpO2:  96%  97%  Weight:      Height:      PainSc:   2  3     Isolation Precautions No active isolations  Medications Medications  senna-docusate (Senokot-S) tablet 2 tablet (2 tablets Oral Given 09/23/23 1026)  acetaminophen (TYLENOL) tablet 650 mg (650 mg Oral Given 09/23/23 1255)  celecoxib  (CELEBREX) capsule 200 mg (200 mg Oral Given 09/23/23 1033)  HYDROmorphone (DILAUDID) injection 0.5 mg (0.5 mg Intravenous Given 09/22/23 1845)  morphine (MS CONTIN) 12 hr tablet 15 mg (15 mg Oral Given 09/23/23 1027)  morphine (MSIR) tablet 15 mg (has no administration in time range)  gabapentin (NEURONTIN) capsule 300 mg (300 mg Oral Given 09/22/23 2351)  levothyroxine (SYNTHROID) tablet 100 mcg (100 mcg Oral Given 09/23/23 0652)  polyethylene glycol (MIRALAX / GLYCOLAX) packet 17 g (has no administration in time range)  sodium chloride flush (NS) 0.9 % injection 3 mL (3 mLs Intravenous Given 09/23/23 1033)  lactated ringers infusion (0 mLs Intravenous Stopped 09/23/23 0653)  vancomycin (VANCOREADY) IVPB 1500 mg/300 mL (has no administration in time range)  cefTRIAXone (ROCEPHIN) 2 g in sodium chloride 0.9 % 100 mL IVPB (2 g Intravenous New Bag/Given 09/23/23 1256)  morphine (PF) 4 MG/ML injection 4 mg (4 mg Intravenous Given 09/22/23 1750)  potassium chloride SA (KLOR-CON M) CR tablet 40 mEq (40 mEq Oral Given 09/23/23 1027)    Mobility walks with person assist     Focused Assessments Hip Pain ; Septic arthritis   R Recommendations: See Admitting Provider Note  Report given to:   Additional Notes:.

## 2023-09-23 NOTE — Consult Note (Signed)
[facility]                                                                                        Patient Identification: Patient Name: Melanie Li MRN: [id number] Admit Date: 09/22/2023 11:39 AM Today's Date: 09/23/2023 Reason for consult: MSSA bacteremia  Requesting provider: CHAMP autoconsult  Principal Problem:   Septic arthritis (HCC) Active Problems:   Hypothyroidism (acquired)   Pain   Antibiotics:  Ceftriaxone 2/1 Cefazolin 2/1  Lines/Hardware:  Assessment # MSSA bacteremia # Rt hip septic arthritis  2/1 status post aspiration of purulent fluid from right hip. OR cx [facility] following, plan for intrevention 2/5  # Acute anemia - Hb 8.6 today, primary working up  Recommendations  - will change IV abtx to cefazolin only  - 2 sets of repeat blood cx ordered, ESR, CRP - TTE ordered - Monitor CBC, BMP - Monitor for metastatic sites of infection - Dr Daiva Eves following starting 2/3  Rest of the management as per the primary team. Please call with questions or concerns.  Thank you for the consult  __________________________________________________________________________________________________________ HPI and Hospital Course: 58 year old female with PMH of hypothyroidism due to the ED on 1/31 for worsening Rt hip pain since mid December when she had mild injury while playing pickleball and received steroid injections shortly thereafter. This was followed by progressive worsening of rt hip pain limiting her ambulation and eventually needing wheelchair. Seen in the ED 12/20 where CT with fluid in the right hip joint may be joint effusion or related to recent injection. She was thought to have radicular back pain and was discharged on analgesics. She was using analgesics at home as needed without much improvement and was seen at [facility], who did MRI hip  and reportedly with concerns for infection of rt hip and sent to ED. No MRI report available.   At ED afebrile Labs remarkable for WBC elevated to 12.0, hemoglobin 9.2 Imaging as below  Denies smoking, alcohol and IVDU. Denies prior h/o hip surgery or hip replacement.   ROS: General- Denies fever, chills, loss of appetite and loss of weight HEENT - Denies headache, blurry vision, neck pain, sinus pain Chest - Denies any chest pain, SOB or cough CVS- Denies any dizziness/lightheadedness, syncopal attacks, palpitations Abdomen- Denies any nausea, vomiting, abdominal pain, hematochezia and diarrhea Neuro - Denies any weakness, numbness, tingling sensation Psych - Denies any changes in mood irritability or depressive symptoms GU- Denies any burning, dysuria, hematuria or increased frequency of urination Skin - denies any rashes/lesions MSK - rt hip pain and limited ROM due to pain  Past Medical History:  Diagnosis Date   Tachycardia    Thyroid disease    Past Surgical History:  Procedure Laterality Date   BUNIONECTOMY Left 2017   also had R bunionectomy in 1984 approx   VULVA SURGERY  2019    Scheduled Meds:  acetaminophen  650 mg Oral Q4H   celecoxib  200 mg Oral BID   Ferrous Fumarate  1 tablet Oral Daily   gabapentin  300 mg Oral QHS   levothyroxine  100  mcg Oral QAC breakfast   morphine  15 mg Oral Q12H   senna-docusate  2 tablet Oral BID   sodium chloride flush  3 mL Intravenous Q12H   Continuous Infusions:  cefTRIAXone (ROCEPHIN)  IV 2 g (09/23/23 1256)   vancomycin     PRN Meds:.HYDROmorphone (DILAUDID) injection, morphine, polyethylene glycol  Allergies  Allergen Reactions   Sulfa Antibiotics Hives, Rash and Other (See Comments)    "Rash covered the entire body"   Oxycodone Other (See Comments)    "Very INEFFECTIVE"   Conjugated Estrogens Rash   Social History   Socioeconomic History   Marital status: Married    Spouse name: Not on file   Number of  children: 1   Years of education: Not on file   Highest education level: Not on file  Occupational History   Not on file  Tobacco Use   Smoking status: Never   Smokeless tobacco: Never  Vaping Use   Vaping status: Never Used  Substance and Sexual Activity   Alcohol use: Yes    Comment: socially   Drug use: Never   Sexual activity: Yes  Other Topics Concern   Not on file  Social History Narrative   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[employer]   Social Drivers of Health   Financial Resource Strain: Not on file  Food Insecurity: No Food Insecurity (09/23/2023)   Hunger Vital Sign    Worried About Running Out of Food in the Last Year: Never true    Ran Out of Food in the Last Year: Never true  Transportation Needs: No Transportation Needs (09/23/2023)   PRAPARE - Administrator, Civil Service (Medical): No    Lack of Transportation (Non-Medical): No  Physical Activity: Not on file  Stress: Not on file  Social Connections: Not on file  Intimate Partner Violence: Not At Risk (09/23/2023)   Humiliation, Afraid, Rape, and Kick questionnaire    Fear of Current or Ex-Partner: No    Emotionally Abused: No    Physically Abused: No    Sexually Abused: No   Family History  Problem Relation Age of Onset   Cancer Mother    Hypertension Mother    Asthma Daughter    Stroke Maternal Grandmother    Cancer Maternal Grandfather    Diabetes Paternal Grandmother    Cancer Paternal Grandfather    Hearing loss Paternal Grandfather     Vitals BP 118/70 (BP Location: Right Arm)   Pulse 78   Temp 97.8 F (36.6 C)   Resp 18   Ht 5\' 8"  (1.727 m)   Wt 70.3 kg   LMP  (LMP Unknown)   SpO2 98%   BMI 23.57 kg/m    Physical Exam Constitutional:  adult female sitting in the bed, not in acute distress    Comments: HEENT wnl  Cardiovascular:     Rate and Rhythm: Normal rate and regular rhythm.     Heart sounds: s1s2  Pulmonary:     Effort: Pulmonary effort is normal.     Comments:  Normal breath sounds   Abdominal:     Palpations: Abdomen is soft.     Tenderness: non distended and non tender  Musculoskeletal:        General: No swelling or tenderness in peripheral joints. Rt hip soreness, no open wound. ROM limited due to pain.  Skin:    Comments: No rashes   Neurological:     General: awake, alert and oriented, following commands.  Psychiatric:        Mood and Affect: Mood normal.   Pertinent Microbiology Results for orders placed or performed during the hospital encounter of 09/22/23  Blood culture (routine x 2)     Status: None (Preliminary result)   Collection Time: 09/22/23  4:50 PM   Specimen: BLOOD  Result Value Ref Range Status   Specimen Description   Final    BLOOD LEFT ANTECUBITAL Performed at [facility], [address], [place], [place] [address]    Special Requests   Final    BOTTLES DRAWN AEROBIC AND ANAEROBIC Blood Culture adequate volume Performed at [facility], [address] [place], [place] [address]    Culture  Setup Time   Final    GRAM POSITIVE COCCI IN CLUSTERS IN BOTH AEROBIC AND ANAEROBIC BOTTLES CRITICAL RESULT CALLED TO, READ BACK BY AND VERIFIED WITH: [employer] MARY SWAYNE [id number] AT 1134 BY EC CRITICAL VALUE NOTED.  VALUE IS CONSISTENT WITH PREVIOUSLY REPORTED AND CALLED VALUE. Performed at [facility], [address] [place], [place] [address]    Culture GRAM POSITIVE COCCI  Final   Report Status PENDING  Incomplete  Blood Culture ID Panel (Reflexed)     Status: Abnormal   Collection Time: 09/22/23  4:50 PM  Result Value Ref Range Status   Enterococcus faecalis NOT DETECTED NOT DETECTED Final   Enterococcus Faecium NOT DETECTED NOT DETECTED Final   Listeria monocytogenes NOT DETECTED NOT DETECTED Final   Staphylococcus species DETECTED (A) NOT DETECTED Final    Comment: CRITICAL RESULT CALLED TO, READ BACK BY AND VERIFIED WITH: PHARMD MARY SWAYNE [id number] AT 1134 BY  EC    Staphylococcus aureus (BCID) DETECTED (A) NOT DETECTED Final    Comment: CRITICAL RESULT CALLED TO, READ BACK BY AND VERIFIED WITH: PHARMD MARY SWAYNE [id number] AT 1134 BY EC    Staphylococcus epidermidis NOT DETECTED NOT DETECTED Final   Staphylococcus lugdunensis NOT DETECTED NOT DETECTED Final   Streptococcus species NOT DETECTED NOT DETECTED Final   Streptococcus agalactiae NOT DETECTED NOT DETECTED Final   Streptococcus pneumoniae NOT DETECTED NOT DETECTED Final   Streptococcus pyogenes NOT DETECTED NOT DETECTED Final   A.calcoaceticus-baumannii NOT DETECTED NOT DETECTED Final   Bacteroides fragilis NOT DETECTED NOT DETECTED Final   Enterobacterales NOT DETECTED NOT DETECTED Final   Enterobacter cloacae complex NOT DETECTED NOT DETECTED Final   Escherichia coli NOT DETECTED NOT DETECTED Final   Klebsiella aerogenes NOT DETECTED NOT DETECTED Final   Klebsiella oxytoca NOT DETECTED NOT DETECTED Final   Klebsiella pneumoniae NOT DETECTED NOT DETECTED Final   Proteus species NOT DETECTED NOT DETECTED Final   Salmonella species NOT DETECTED NOT DETECTED Final   Serratia marcescens NOT DETECTED NOT DETECTED Final   Haemophilus influenzae NOT DETECTED NOT DETECTED Final   Neisseria meningitidis NOT DETECTED NOT DETECTED Final   Pseudomonas aeruginosa NOT DETECTED NOT DETECTED Final   Stenotrophomonas maltophilia NOT DETECTED NOT DETECTED Final   Candida albicans NOT DETECTED NOT DETECTED Final   Candida auris NOT DETECTED NOT DETECTED Final   Candida glabrata NOT DETECTED NOT DETECTED Final   Candida krusei NOT DETECTED NOT DETECTED Final   Candida parapsilosis NOT DETECTED NOT DETECTED Final   Candida tropicalis NOT DETECTED NOT DETECTED Final   Cryptococcus neoformans/gattii NOT DETECTED NOT DETECTED Final   Meth resistant mecA/C and MREJ NOT DETECTED NOT DETECTED Final    Comment: Performed at [facility], [address]  [place], [place] [address]  Blood culture  (routine x 2)     Status: None (Preliminary result)   Collection Time: 09/22/23  5:07 PM   Specimen: BLOOD  Result Value Ref Range Status   Specimen Description   Final    BLOOD RIGHT ANTECUBITAL Performed at [facility], [address], [place], [place] [address]    Special Requests   Final    BOTTLES DRAWN AEROBIC AND ANAEROBIC Blood Culture adequate volume Performed at [facility], [address], [place], [place] [address]    Culture  Setup Time   Final    GRAM POSITIVE COCCI IN CLUSTERS IN BOTH AEROBIC AND ANAEROBIC BOTTLES CRITICAL VALUE NOTED.  VALUE IS CONSISTENT WITH PREVIOUSLY REPORTED AND CALLED VALUE. Performed at [facility], [address] [place], [place] [address]    Culture GRAM POSITIVE COCCI  Final   Report Status PENDING  Incomplete  Anaerobic culture w Gram Stain     Status: None (Preliminary result)   Collection Time: 09/23/23 10:19 AM   Specimen: Joint, Right Hip; Synovial Fluid  Result Value Ref Range Status   Specimen Description   Final    SYNOVIAL Performed at [facility], [address], [place], [place] [address]    Special Requests   Final    NONE RIGHT HIP Performed at [facility], [address], [place], [place] [address]    Gram Stain   Final    MODERATE WBC PRESENT,BOTH PMN AND MONONUCLEAR FEW GRAM POSITIVE COCCI IN CLUSTERS Performed at [facility], [address] [place], [place] [address]    Culture PENDING  Incomplete   Report Status PENDING  Incomplete   Pertinent Lab seen by me:    Latest Ref Rng & Units 09/24/2023    5:57 AM 09/23/2023    1:00 PM 09/23/2023    4:55 AM  CBC  WBC 4.0 - 10.5 K/uL 11.8   10.9   Hemoglobin 12.0 - 15.0 g/dL 8.6  9.2  7.8   Hematocrit 36.0 - 46.0 % 27.4  28.1  24.3   Platelets 150 - 400 K/uL 383   314       Latest Ref Rng & Units 09/24/2023    5:57 AM 09/23/2023    4:55 AM 09/22/2023    1:26 PM  CMP  Glucose  70 - 99 mg/dL 95  962  952   BUN 6 - 20 mg/dL 14  14  13    Creatinine 0.44 - 1.00 mg/dL 8.41  3.24  4.01   Sodium 135 - 145 mmol/L 136  136  136   Potassium 3.5 - 5.1 mmol/L 4.4  3.4  3.7   Chloride 98 - 111 mmol/L 106  104  102   CO2 22 - 32 mmol/L 22  23  21    Calcium 8.9 - 10.3 mg/dL 8.5  8.6  8.8   Total Protein 6.5 - 8.1 g/dL   7.1   Total Bilirubin 0.0 - 1.2 mg/dL   0.9   Alkaline Phos 38 - 126 U/L   68   AST 15 - 41 U/L   29   ALT 0 - 44 U/L   26     Pertinent Imagings/Other Imagings Plain films and CT images have been personally visualized and interpreted; radiology reports have been reviewed. Decision making incorporated into the Impression / Recommendations.  DG FLUORO GUIDED NEEDLE PLC ASPIRATION/INJECTION LOC Result Date: 09/23/2023 INDICATION:  58 year old female with right hip pain due to injury status post intra-articular steroid injection presents with acute right hip pain. MR at OSH raised concern for septic arthritis. Request for fluoro guided right hip aspiration. EXAM: ASPIRATION OF RIGHT HIP JOINT UNDER FLUOROSCOPIC GUIDANCE COMPARISON:  None Available. FLUOROSCOPY TIME:  Radiation Exposure Index:  0.6 mGy COMPLICATIONS: None immediate. PROCEDURE: Informed written consent was obtained from the patient after discussion of the risks, benefits and alternatives to treatment. The patient was placed supine on the fluoroscopy table and the right extremity was placed in a slight degree of internal rotation. The right hip was localized with fluoroscopy. The skin overlying the anterior aspect of the hip was prepped and draped in usual sterile fashion. A 3.5 inch 18 gauge spinal needle was advanced into the hip joint at the lateral aspect of the femoral head-neck junction, after the overlying soft tissues were anesthetized with 1% lidocaine. A fluoroscopic image was saved and sent to PACs. 2 mL of purulent fluid was aspirated without difficulty. The sample was sent for laboratory. The  needle was removed and a dressing was placed. The patient tolerated procedure well without immediate postprocedural complication. IMPRESSION: Successful fluoroscopic guided aspiration of the right hip. This procedure was performed by Lawernce Ion, PA-C under the supervision of Richarda Overlie, MD. Electronically Signed   By: Richarda Overlie M.D.   On: 09/23/2023 10:32   DG Hip Unilat W or Wo Pelvis 2-3 Views Right Result Date: 09/22/2023 CLINICAL DATA:  Hip pain EXAM: DG HIP (WITH OR WITHOUT PELVIS) 3V RIGHT COMPARISON:  None Available. FINDINGS: Severe concentric joint space loss of the right hip. Minimal degenerative changes as well of the right sacroiliac joint. No fracture or dislocation. Preserved bone mineralization. Preserved left hip joint. Presumed vascular calcifications in the pelvis. 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 Additional degenerative changes of the right sacroiliac joint. Electronically Signed   By: Karen Kays M.D.   On: 09/22/2023 12:48   I have personally spent 81 minutes involved in face-to-face and non-face-to-face activities for this patient on the day of the visit. Professional time spent includes the following activities: Preparing to see the patient (review of tests), Obtaining and/or reviewing separately obtained history (admission/discharge record), Performing a medically appropriate examination and/or evaluation , Ordering medications/tests/procedures, referring and communicating with other health care professionals, Documenting clinical information in the EMR, Independently interpreting results (not separately reported), Communicating results to the patient/family/caregiver, Counseling and educating the patient/family/caregiver and Care coordination (not separately reported).  Electronically signed by:   Plan d/w requesting provider as well as ID pharm D  Of note, portions of this note may have been created with voice recognition software. While this note has been  edited for accuracy, occasional wrong-word or 'sound-a-like' substitutions may have occurred due to the inherent limitations of voice recognition software.   Odette Fraction, MD Infectious Disease Physician [facility] Pager: [phone]

## 2023-09-24 ENCOUNTER — Inpatient Hospital Stay (HOSPITAL_COMMUNITY): Payer: 59

## 2023-09-24 DIAGNOSIS — R7881 Bacteremia: Secondary | ICD-10-CM | POA: Diagnosis not present

## 2023-09-24 DIAGNOSIS — M009 Pyogenic arthritis, unspecified: Secondary | ICD-10-CM | POA: Diagnosis not present

## 2023-09-24 DIAGNOSIS — M00051 Staphylococcal arthritis, right hip: Secondary | ICD-10-CM | POA: Diagnosis not present

## 2023-09-24 LAB — ECHOCARDIOGRAM COMPLETE
Area-P 1/2: 4.19 cm2
Height: 68 in
S' Lateral: 3.3 cm
Weight: 2480 [oz_av]

## 2023-09-24 LAB — CBC
HCT: 27.4 % — ABNORMAL LOW (ref 36.0–46.0)
Hemoglobin: 8.6 g/dL — ABNORMAL LOW (ref 12.0–15.0)
MCH: 30 pg (ref 26.0–34.0)
MCHC: 31.4 g/dL (ref 30.0–36.0)
MCV: 95.5 fL (ref 80.0–100.0)
Platelets: 383 10*3/uL (ref 150–400)
RBC: 2.87 MIL/uL — ABNORMAL LOW (ref 3.87–5.11)
RDW: 12.4 % (ref 11.5–15.5)
WBC: 11.8 10*3/uL — ABNORMAL HIGH (ref 4.0–10.5)
nRBC: 0 % (ref 0.0–0.2)

## 2023-09-24 LAB — BASIC METABOLIC PANEL
Anion gap: 8 (ref 5–15)
BUN: 14 mg/dL (ref 6–20)
CO2: 22 mmol/L (ref 22–32)
Calcium: 8.5 mg/dL — ABNORMAL LOW (ref 8.9–10.3)
Chloride: 106 mmol/L (ref 98–111)
Creatinine, Ser: 0.72 mg/dL (ref 0.44–1.00)
GFR, Estimated: >60 mL/min (ref >60)
Glucose, Bld: 95 mg/dL (ref 70–99)
Potassium: 4.4 mmol/L (ref 3.5–5.1)
Sodium: 136 mmol/L (ref 135–145)

## 2023-09-24 LAB — C-REACTIVE PROTEIN: CRP: 22.6 mg/dL — ABNORMAL HIGH (ref <1.0)

## 2023-09-24 LAB — SEDIMENTATION RATE: Sed Rate: 125 mm/h — ABNORMAL HIGH (ref 0–22)

## 2023-09-24 MED ORDER — ENOXAPARIN SODIUM 40 MG/0.4ML IJ SOSY
40.0000 mg | PREFILLED_SYRINGE | INTRAMUSCULAR | Status: AC
Start: 2023-09-24 — End: 2023-09-26
  Administered 2023-09-24 – 2023-09-26 (×3): 40 mg via SUBCUTANEOUS
  Filled 2023-09-24 (×3): qty 0.4

## 2023-09-24 NOTE — Progress Notes (Addendum)
PROGRESS NOTE    Melanie Li  YNW:[id number] DOB: 04/21/1966 DOA: 09/22/2023 PCP: Jeani Sow, MD   Brief Narrative:  Pleasant 58 year old with no significant past medical history presented to ER with worsening right hip pain for nearly 1-1/29-month refractory to medications and intra-articular injection, now inability to ambulate.  Stat MRI of the hip was obtained at [facility] concerning for septic arthritis.  Patient was seen by Dr. Linna Caprice in the office and was recommended ER for admission.   Assessment & Plan:   Right hip septic arthritis: -Status post IR guided joint aspiration on 2/1.  2 mL of purulent fluid was obtained.  Fluid was sent for labs-pending -Markedly elevated inflammatory markers.  Ortho consulted. -Continue as needed pain medications. -Plan is right hip debridement/placement of antibiotic spacer on Wednesday -N.p.o. after midnight Tuesday night.  Hold DVT prophylaxis after Tuesday a.m. dose.  MSSA bacteremia: -Blood culture positive for MSSA.  ID consulted.  Ordered echo.  On cefazolin.  Hypothyroidism: Continue levothyroxine  Acute anemia: -Baseline H&H: 12,  -H&H ranging between 8-9.  Denies melena.  She had colonoscopy more than 10 years ago at [place] that was negative. -Iron panel suggesting iron deficiency.  Continue iron supplements.  DVT prophylaxis: SCD, heart Lovenox on 09/24/2023 Code Status: Full code Family Communication:  None present at bedside.  Plan of care discussed with patient in length and he verbalized understanding and agreed with it.  I discussed plan of care with patient's daughter on the phone.  Disposition Plan: To be determined  Consultants:  IR Ortho  Procedures:  Joint aspiration  Antimicrobials:  Vancomycin. Rocephin  Status is: Inpatient    Subjective: Patient seen and examined.  Continues to have right hip pain.  Remained afebrile.  No acute events overnight.  Objective: Vitals:   09/23/23 1411  09/23/23 2115 09/24/23 0049 09/24/23 0515  BP: 104/65 122/72 109/68 118/70  Pulse: 82 (!) 102 (!) 106 78  Resp: 15 16 16 18   Temp: 97.9 F (36.6 C) 99.3 F (37.4 C) 98.6 F (37 C) 97.8 F (36.6 C)  TempSrc:      SpO2: 100% 98% 98% 98%  Weight:      Height:        Intake/Output Summary (Last 24 hours) at 09/24/2023 1147 Last data filed at 09/23/2023 2300 Gross per 24 hour  Intake 440 ml  Output --  Net 440 ml   Filed Weights   09/22/23 1145  Weight: 70.3 kg    Examination:  General exam: Appears calm and comfortable, on room air, communicating well, appears sick Respiratory system: Clear to auscultation. Respiratory effort normal. Cardiovascular system: S1 & S2 heard, RRR. No JVD, murmurs, rubs, gallops or clicks. No pedal edema. Gastrointestinal system: Abdomen is nondistended, soft and nontender. No organomegaly or masses felt. Normal bowel sounds heard. Central nervous system: Alert and oriented. No focal neurological deficits. Extremities: Right hip: Not erythematous, not swelling.  Pain aggravates with movement.  Able to flex some Skin: No rashes, lesions or ulcers Psychiatry: Judgement and insight appear normal. Mood & affect appropriate.    Data Reviewed: I have personally reviewed following labs and imaging studies  CBC: Recent Labs  Lab 09/22/23 0023 09/22/23 1326 09/23/23 0455 09/23/23 1300 09/24/23 0557  WBC 12.0* 14.0* 10.9*  --  11.8*  NEUTROABS  --  10.8*  --   --   --   HGB 9.2* 9.3* 7.8* 9.2* 8.6*  HCT 28.7* 28.7* 24.3* 28.1* 27.4*  MCV 92.6 90.5 94.6  --  95.5  PLT 367 410* 314  --  383   Basic Metabolic Panel: Recent Labs  Lab 09/22/23 0023 09/22/23 1326 09/23/23 0455 09/24/23 0557  NA  --  136 136 136  K  --  3.7 3.4* 4.4  CL  --  102 104 106  CO2  --  21* 23 22  GLUCOSE  --  108* 101* 95  BUN  --  13 14 14   CREATININE 0.76 0.76 0.75 0.72  CALCIUM  --  8.8* 8.6* 8.5*  MG  --   --  2.0  --    GFR: Estimated Creatinine Clearance:  76.4 mL/min (by C-G formula based on SCr of 0.72 mg/dL). Liver Function Tests: Recent Labs  Lab 09/22/23 1326  AST 29  ALT 26  ALKPHOS 68  BILITOT 0.9  PROT 7.1  ALBUMIN 2.6*   No results for input(s): "LIPASE", "AMYLASE" in the last 168 hours. No results for input(s): "AMMONIA" in the last 168 hours. Coagulation Profile: Recent Labs  Lab 09/23/23 0455  INR 1.3*   Cardiac Enzymes: No results for input(s): "CKTOTAL", "CKMB", "CKMBINDEX", "TROPONINI" in the last 168 hours. BNP (last 3 results) No results for input(s): "PROBNP" in the last 8760 hours. HbA1C: No results for input(s): "HGBA1C" in the last 72 hours. CBG: No results for input(s): "GLUCAP" in the last 168 hours. Lipid Profile: No results for input(s): "CHOL", "HDL", "LDLCALC", "TRIG", "CHOLHDL", "LDLDIRECT" in the last 72 hours. Thyroid Function Tests: No results for input(s): "TSH", "T4TOTAL", "FREET4", "T3FREE", "THYROIDAB" in the last 72 hours. Anemia Panel: Recent Labs    09/23/23 1300 09/23/23 1508  FERRITIN  --  903*  TIBC 130*  --   IRON 19*  --    Sepsis Labs: Recent Labs  Lab 09/22/23 1650  LATICACIDVEN 1.0    Recent Results (from the past 240 hours)  Blood culture (routine x 2)     Status: None (Preliminary result)   Collection Time: 09/22/23  4:50 PM   Specimen: BLOOD  Result Value Ref Range Status   Specimen Description   Final    BLOOD LEFT ANTECUBITAL Performed at [facility], [address], [place], [place] [address]    Special Requests   Final    BOTTLES DRAWN AEROBIC AND ANAEROBIC Blood Culture adequate volume Performed at [facility], [address], [place], [place] [address]    Culture  Setup Time   Final    GRAM POSITIVE COCCI IN CLUSTERS IN BOTH AEROBIC AND ANAEROBIC BOTTLES CRITICAL RESULT CALLED TO, READ BACK BY AND VERIFIED WITH: [facility] MARY SWAYNE [phone] AT 1134 BY EC CRITICAL VALUE NOTED.  VALUE IS CONSISTENT WITH PREVIOUSLY  REPORTED AND CALLED VALUE. Performed at [facility], [address], [place], [place] [address]    Culture GRAM POSITIVE COCCI  Final   Report Status PENDING  Incomplete  Blood Culture ID Panel (Reflexed)     Status: Abnormal   Collection Time: 09/22/23  4:50 PM  Result Value Ref Range Status   Enterococcus faecalis NOT DETECTED NOT DETECTED Final   Enterococcus Faecium NOT DETECTED NOT DETECTED Final   Listeria monocytogenes NOT DETECTED NOT DETECTED Final   Staphylococcus species DETECTED (A) NOT DETECTED Final    Comment: CRITICAL RESULT CALLED TO, READ BACK BY AND VERIFIED WITH: PHARMD MARY SWAYNE [phone] AT 1134 BY EC    Staphylococcus aureus (BCID) DETECTED (A) NOT DETECTED Final    Comment: CRITICAL RESULT CALLED TO, READ BACK  BY AND VERIFIED WITH: PHARMD MARY SWAYNE [phone] AT 1134 BY EC    Staphylococcus epidermidis NOT DETECTED NOT DETECTED Final   Staphylococcus lugdunensis NOT DETECTED NOT DETECTED Final   Streptococcus species NOT DETECTED NOT DETECTED Final   Streptococcus agalactiae NOT DETECTED NOT DETECTED Final   Streptococcus pneumoniae NOT DETECTED NOT DETECTED Final   Streptococcus pyogenes NOT DETECTED NOT DETECTED Final   A.calcoaceticus-baumannii NOT DETECTED NOT DETECTED Final   Bacteroides fragilis NOT DETECTED NOT DETECTED Final   Enterobacterales NOT DETECTED NOT DETECTED Final   Enterobacter cloacae complex NOT DETECTED NOT DETECTED Final   Escherichia coli NOT DETECTED NOT DETECTED Final   Klebsiella aerogenes NOT DETECTED NOT DETECTED Final   Klebsiella oxytoca NOT DETECTED NOT DETECTED Final   Klebsiella pneumoniae NOT DETECTED NOT DETECTED Final   Proteus species NOT DETECTED NOT DETECTED Final   Salmonella species NOT DETECTED NOT DETECTED Final   Serratia marcescens NOT DETECTED NOT DETECTED Final   Haemophilus influenzae NOT DETECTED NOT DETECTED Final   Neisseria meningitidis NOT DETECTED NOT DETECTED Final   Pseudomonas aeruginosa  NOT DETECTED NOT DETECTED Final   Stenotrophomonas maltophilia NOT DETECTED NOT DETECTED Final   Candida albicans NOT DETECTED NOT DETECTED Final   Candida auris NOT DETECTED NOT DETECTED Final   Candida glabrata NOT DETECTED NOT DETECTED Final   Candida krusei NOT DETECTED NOT DETECTED Final   Candida parapsilosis NOT DETECTED NOT DETECTED Final   Candida tropicalis NOT DETECTED NOT DETECTED Final   Cryptococcus neoformans/gattii NOT DETECTED NOT DETECTED Final   Meth resistant mecA/C and MREJ NOT DETECTED NOT DETECTED Final    Comment: Performed at [facility], [address], [place], [place] [address]  Blood culture (routine x 2)     Status: None (Preliminary result)   Collection Time: 09/22/23  5:07 PM   Specimen: BLOOD  Result Value Ref Range Status   Specimen Description   Final    BLOOD RIGHT ANTECUBITAL Performed at [facility], [address], [place], [place] [address]    Special Requests   Final    BOTTLES DRAWN AEROBIC AND ANAEROBIC Blood Culture adequate volume Performed at [facility], [address] [place], [place] [address]    Culture  Setup Time   Final    GRAM POSITIVE COCCI IN CLUSTERS IN BOTH AEROBIC AND ANAEROBIC BOTTLES CRITICAL VALUE NOTED.  VALUE IS CONSISTENT WITH PREVIOUSLY REPORTED AND CALLED VALUE. Performed at [facility], [address], [place], [place] [address]    Culture GRAM POSITIVE COCCI  Final   Report Status PENDING  Incomplete  Anaerobic culture w Gram Stain     Status: None (Preliminary result)   Collection Time: 09/23/23 10:19 AM   Specimen: Joint, Right Hip; Synovial Fluid  Result Value Ref Range Status   Specimen Description   Final    SYNOVIAL Performed at [facility], [address] [place], [place] [address]    Special Requests   Final    NONE RIGHT HIP Performed at [facility], [address] [place], [place] [address]    Gram  Stain   Final    MODERATE WBC PRESENT,BOTH PMN AND MONONUCLEAR FEW GRAM POSITIVE COCCI IN CLUSTERS Performed at [facility], [address], [place], [place] [address]    Culture PENDING  Incomplete   Report Status PENDING  Incomplete      Radiology Studies: DG FLUORO GUIDED NEEDLE PLC  ASPIRATION/INJECTION LOC Result Date: 09/23/2023 INDICATION: 58 year old female with right hip pain due to injury status post intra-articular steroid injection presents with acute right hip pain. MR at OSH raised concern for septic arthritis. Request for fluoro guided right hip aspiration. EXAM: ASPIRATION OF RIGHT HIP JOINT UNDER FLUOROSCOPIC GUIDANCE COMPARISON:  None Available. FLUOROSCOPY TIME:  Radiation Exposure Index:  0.6 mGy COMPLICATIONS: None immediate. PROCEDURE: Informed written consent was obtained from the patient after discussion of the risks, benefits and alternatives to treatment. The patient was placed supine on the fluoroscopy table and the right extremity was placed in a slight degree of internal rotation. The right hip was localized with fluoroscopy. The skin overlying the anterior aspect of the hip was prepped and draped in usual sterile fashion. A 3.5 inch 18 gauge spinal needle was advanced into the hip joint at the lateral aspect of the femoral head-neck junction, after the overlying soft tissues were anesthetized with 1% lidocaine. A fluoroscopic image was saved and sent to PACs. 2 mL of purulent fluid was aspirated without difficulty. The sample was sent for laboratory. The needle was removed and a dressing was placed. The patient tolerated procedure well without immediate postprocedural complication. IMPRESSION: Successful fluoroscopic guided aspiration of the right hip. This procedure was performed by Lawernce Ion, PA-C under the supervision of Richarda Overlie, MD. Electronically Signed   By: Richarda Overlie M.D.   On: 09/23/2023 10:32   DG Hip Unilat W or Wo Pelvis 2-3 Views Right Result Date:  09/22/2023 CLINICAL DATA:  Hip pain EXAM: DG HIP (WITH OR WITHOUT PELVIS) 3V RIGHT COMPARISON:  None Available. FINDINGS: Severe concentric joint space loss of the right hip. Minimal degenerative changes as well of the right sacroiliac joint. No fracture or dislocation. Preserved bone mineralization. Preserved left hip joint. Presumed vascular calcifications in the pelvis. 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 Additional degenerative changes of the right sacroiliac joint. Electronically Signed   By: Karen Kays M.D.   On: 09/22/2023 12:48    Scheduled Meds:  acetaminophen  650 mg Oral Q4H   celecoxib  200 mg Oral BID   Ferrous Fumarate  1 tablet Oral Daily   gabapentin  300 mg Oral QHS   levothyroxine  100 mcg Oral QAC breakfast   senna-docusate  2 tablet Oral BID   sodium chloride flush  3 mL Intravenous Q12H   Continuous Infusions:   ceFAZolin (ANCEF) IV 2 g (09/24/23 0617)     LOS: 2 days   Time spent: 35 minutes   Dimarco Minkin Estill Cotta, MD [facility]  If 7PM-7AM, please contact night-coverage [email or web address] 09/24/2023, 11:47 AM

## 2023-09-24 NOTE — Progress Notes (Signed)
Subjective:  Patient reports pain as moderate to severe.  Denies N/V/CP/SOB. C/o right hip pain.  Objective:   VITALS:   Vitals:   09/23/23 1411 09/23/23 2115 09/24/23 0049 09/24/23 0515  BP: 104/65 122/72 109/68 118/70  Pulse: 82 (!) 102 (!) 106 78  Resp: 15 16 16 18   Temp: 97.9 F (36.6 C) 99.3 F (37.4 C) 98.6 F (37 C) 97.8 F (36.6 C)  TempSrc:      SpO2: 100% 98% 98% 98%  Weight:      Height:        NAD ABD soft Sensation intact distally Intact pulses distally Dorsiflexion/Plantar flexion intact No cellulitis present Pain w ROM R hip  Lab Results  Component Value Date   WBC 11.8 (H) 09/24/2023   HGB 8.6 (L) 09/24/2023   HCT 27.4 (L) 09/24/2023   MCV 95.5 09/24/2023   PLT 383 09/24/2023   BMET    Component Value Date/Time   NA 136 09/24/2023 0557   NA 142 11/30/2021 0000   K 4.4 09/24/2023 0557   CL 106 09/24/2023 0557   CO2 22 09/24/2023 0557   GLUCOSE 95 09/24/2023 0557   BUN 14 09/24/2023 0557   BUN 13 11/30/2021 0000   CREATININE 0.72 09/24/2023 0557   CALCIUM 8.5 (L) 09/24/2023 0557   EGFR 80 11/30/2021 0000   GFRNONAA >60 09/24/2023 0557   Recent Results (from the past 240 hours)  Blood culture (routine x 2)     Status: None (Preliminary result)   Collection Time: 09/22/23  4:50 PM   Specimen: BLOOD  Result Value Ref Range Status   Specimen Description   Final    BLOOD LEFT ANTECUBITAL Performed at [facility], [address] [place], [place] [address]    Special Requests   Final    BOTTLES DRAWN AEROBIC AND ANAEROBIC Blood Culture adequate volume Performed at [facility], [address], [place], [place] [address]    Culture  Setup Time   Final    GRAM POSITIVE COCCI IN CLUSTERS IN BOTH AEROBIC AND ANAEROBIC BOTTLES CRITICAL RESULT CALLED TO, READ BACK BY AND VERIFIED WITH: [facility] MARY SWAYNE [phone] AT 1134 BY EC CRITICAL VALUE NOTED.  VALUE IS CONSISTENT WITH PREVIOUSLY REPORTED AND  CALLED VALUE. Performed at [facility], [address] [place], [place] [address]    Culture GRAM POSITIVE COCCI  Final   Report Status PENDING  Incomplete  Blood Culture ID Panel (Reflexed)     Status: Abnormal   Collection Time: 09/22/23  4:50 PM  Result Value Ref Range Status   Enterococcus faecalis NOT DETECTED NOT DETECTED Final   Enterococcus Faecium NOT DETECTED NOT DETECTED Final   Listeria monocytogenes NOT DETECTED NOT DETECTED Final   Staphylococcus species DETECTED (A) NOT DETECTED Final    Comment: CRITICAL RESULT CALLED TO, READ BACK BY AND VERIFIED WITH: PHARMD MARY SWAYNE [id number] AT 1134 BY EC    Staphylococcus aureus (BCID) DETECTED (A) NOT DETECTED Final    Comment: CRITICAL RESULT CALLED TO, READ BACK BY AND VERIFIED WITH: PHARMD MARY SWAYNE [phone] AT 1134 BY EC    Staphylococcus epidermidis NOT DETECTED NOT DETECTED Final   Staphylococcus lugdunensis NOT DETECTED NOT DETECTED Final   Streptococcus species NOT DETECTED NOT DETECTED Final   Streptococcus agalactiae NOT DETECTED NOT DETECTED Final   Streptococcus pneumoniae NOT DETECTED NOT DETECTED Final   Streptococcus pyogenes NOT DETECTED NOT DETECTED Final   A.calcoaceticus-baumannii NOT DETECTED  NOT DETECTED Final   Bacteroides fragilis NOT DETECTED NOT DETECTED Final   Enterobacterales NOT DETECTED NOT DETECTED Final   Enterobacter cloacae complex NOT DETECTED NOT DETECTED Final   Escherichia coli NOT DETECTED NOT DETECTED Final   Klebsiella aerogenes NOT DETECTED NOT DETECTED Final   Klebsiella oxytoca NOT DETECTED NOT DETECTED Final   Klebsiella pneumoniae NOT DETECTED NOT DETECTED Final   Proteus species NOT DETECTED NOT DETECTED Final   Salmonella species NOT DETECTED NOT DETECTED Final   Serratia marcescens NOT DETECTED NOT DETECTED Final   Haemophilus influenzae NOT DETECTED NOT DETECTED Final   Neisseria meningitidis NOT DETECTED NOT DETECTED Final   Pseudomonas aeruginosa NOT DETECTED  NOT DETECTED Final   Stenotrophomonas maltophilia NOT DETECTED NOT DETECTED Final   Candida albicans NOT DETECTED NOT DETECTED Final   Candida auris NOT DETECTED NOT DETECTED Final   Candida glabrata NOT DETECTED NOT DETECTED Final   Candida krusei NOT DETECTED NOT DETECTED Final   Candida parapsilosis NOT DETECTED NOT DETECTED Final   Candida tropicalis NOT DETECTED NOT DETECTED Final   Cryptococcus neoformans/gattii NOT DETECTED NOT DETECTED Final   Meth resistant mecA/C and MREJ NOT DETECTED NOT DETECTED Final    Comment: Performed at [facility], [address], [place], [place] [address]  Blood culture (routine x 2)     Status: None (Preliminary result)   Collection Time: 09/22/23  5:07 PM   Specimen: BLOOD  Result Value Ref Range Status   Specimen Description   Final    BLOOD RIGHT ANTECUBITAL Performed at [facility], [address] [place], [place] [address]    Special Requests   Final    BOTTLES DRAWN AEROBIC AND ANAEROBIC Blood Culture adequate volume Performed at [facility], [address] [place], [place] [address]    Culture  Setup Time   Final    GRAM POSITIVE COCCI IN CLUSTERS IN BOTH AEROBIC AND ANAEROBIC BOTTLES CRITICAL VALUE NOTED.  VALUE IS CONSISTENT WITH PREVIOUSLY REPORTED AND CALLED VALUE. Performed at [facility], [address] [place], [place] [address]    Culture GRAM POSITIVE COCCI  Final   Report Status PENDING  Incomplete  Anaerobic culture w Gram Stain     Status: None (Preliminary result)   Collection Time: 09/23/23 10:19 AM   Specimen: Joint, Right Hip; Synovial Fluid  Result Value Ref Range Status   Specimen Description   Final    SYNOVIAL Performed at [facility], [address], [place], [place] [address]    Special Requests   Final    NONE RIGHT HIP Performed at [facility], [address], [place], [place] [address]    Gram Stain   Final     MODERATE WBC PRESENT,BOTH PMN AND MONONUCLEAR FEW GRAM POSITIVE COCCI IN CLUSTERS Performed at [facility], [address] [place], [place] [address]    Culture PENDING  Incomplete   Report Status PENDING  Incomplete     Assessment/Plan:     Principal Problem:   Septic arthritis (HCC) Active Problems:   Hypothyroidism (acquired)   Pain   Subacute MSSA NSA R hip with destruction of cartilage and bone -R hip synovial fluid cx (2/1): (+) GPC -blood cx (1/31): (+) MSSA -ID following: rec IV ancef -will need PICC line once blood cultures are (-) -prealbumin ordered -type and screen ordered -to OR Wednesday for R hip debridement / placement of abx spacer -NPO  after MN Tuesday night, hold chemical DVT ppx after Tues am dose   Iline Melanie Li 09/24/2023, 12:15 PM   Samson Frederic, MD [phone] [facility] is now [facility]  [place] [address], [address], [place], [place] [address] Phone: [phone] [email or web address] Facebook  [employer]

## 2023-09-24 NOTE — Progress Notes (Signed)
  Echocardiogram 2D Echocardiogram has been performed.  Leda Roys RDCS 09/24/2023, 1:37 PM

## 2023-09-24 NOTE — Plan of Care (Signed)

## 2023-09-25 DIAGNOSIS — M869 Osteomyelitis, unspecified: Secondary | ICD-10-CM | POA: Diagnosis not present

## 2023-09-25 DIAGNOSIS — M25551 Pain in right hip: Secondary | ICD-10-CM

## 2023-09-25 DIAGNOSIS — B9561 Methicillin susceptible Staphylococcus aureus infection as the cause of diseases classified elsewhere: Secondary | ICD-10-CM

## 2023-09-25 DIAGNOSIS — Z1159 Encounter for screening for other viral diseases: Secondary | ICD-10-CM

## 2023-09-25 DIAGNOSIS — R7881 Bacteremia: Secondary | ICD-10-CM | POA: Diagnosis not present

## 2023-09-25 DIAGNOSIS — M25552 Pain in left hip: Secondary | ICD-10-CM | POA: Diagnosis not present

## 2023-09-25 DIAGNOSIS — M00051 Staphylococcal arthritis, right hip: Secondary | ICD-10-CM | POA: Diagnosis not present

## 2023-09-25 LAB — CULTURE, BLOOD (ROUTINE X 2)
Special Requests: ADEQUATE
Special Requests: ADEQUATE

## 2023-09-25 LAB — PREALBUMIN: Prealbumin: 7 mg/dL — ABNORMAL LOW (ref 18–38)

## 2023-09-25 LAB — SEDIMENTATION RATE: Sed Rate: 134 mm/h — ABNORMAL HIGH (ref 0–22)

## 2023-09-25 LAB — CBC
HCT: 25.8 % — ABNORMAL LOW (ref 36.0–46.0)
Hemoglobin: 8.2 g/dL — ABNORMAL LOW (ref 12.0–15.0)
MCH: 29.7 pg (ref 26.0–34.0)
MCHC: 31.8 g/dL (ref 30.0–36.0)
MCV: 93.5 fL (ref 80.0–100.0)
Platelets: 397 10*3/uL (ref 150–400)
RBC: 2.76 MIL/uL — ABNORMAL LOW (ref 3.87–5.11)
RDW: 12.5 % (ref 11.5–15.5)
WBC: 11.3 10*3/uL — ABNORMAL HIGH (ref 4.0–10.5)
nRBC: 0 % (ref 0.0–0.2)

## 2023-09-25 LAB — APTT: aPTT: 60 s — ABNORMAL HIGH (ref 24–36)

## 2023-09-25 LAB — PROTIME-INR
INR: 1.2 (ref 0.8–1.2)
Prothrombin Time: 15.2 s (ref 11.4–15.2)

## 2023-09-25 LAB — C-REACTIVE PROTEIN: CRP: 20.5 mg/dL — ABNORMAL HIGH (ref <1.0)

## 2023-09-25 NOTE — Progress Notes (Signed)
PROGRESS NOTE Melanie Li  RJJ:[id number] DOB: September 22, 1965 DOA: 09/22/2023 PCP: Jeani Sow, MD  Brief Narrative/Hospital Course: 58 year old with no significant past medical history presented to ER with worsening right hip pain for nearly 1-1/10-month refractory to medications and intra-articular injection, now inability to ambulate. Stat MRI of the hip was obtained at [facility] concerning for septic arthritis. Patient was seen by Dr. Linna Caprice in the office and was recommended ER for admission for right hip septic arthritis. S/p IR guided joint aspiration in 2/1, ID has been consulted, blood culture positive for MSSA     Subjective: Seen and examined this morning  Complains of deep pain in the right hip on movement  overnight afebrile BP stable Labs reviewed with mild leukocytosis and anemia about the same  Assessment and Plan: Principal Problem:   Septic arthritis (HCC) Active Problems:   Hypothyroidism (acquired)   Pain   Right hip MSSA septic arthritis with destruction of cartilage and bone: S/p IR guided joint aspiration on 2/1-purulent fluid was obtained and culture- gram stain- GPC In clusters.  Inflammatory markers markedly elevated, orthopedic input appreciated continue IV antibiotics as per ID, pain rx  Planning for right hip debridement and placement of antibiotic spacer 1/5 Hold DVT prophylaxis after Tuesday a.m. dose.   MSSA bacteremia: Blood culture positive for MSSA.  ID following, recheck blood culture 2/2 NGTD.  2D echo 2/2 with EF 60 to 65%, G1 DD small pericardial effusion, mitral valve normal aortic valve tricuspid cannot exclude small PFO.  Continue plan as per ID with antibiotics, hip debridement as above  Hypothyroidism:  Continue home levothyroxine   Acute anemia Likely in the setting of acute illness septic arthritis Magod holding 8 to 9 g range, a month ago was 12.6 on admission 9.2. Denies melena.She had colonoscopy more than 10 years ago at  [place] that was negative. Iron panel suggesting iron deficiency.  Continue iron supplements.   DVT prophylaxis: enoxaparin (LOVENOX) injection 40 mg Start: 09/24/23 1400 SCDs Start: 09/22/23 1911 Code Status:   Code Status: Full Code Family Communication: plan of care discussed with patient at bedside. Patient status is: Remains hospitalized because of severity of illness Level of care: Telemetry   Dispo: The patient is from: home            Anticipated disposition: TBD Objective: Vitals last 24 hrs: Vitals:   09/24/23 1332 09/24/23 2045 09/25/23 0445 09/25/23 1206  BP: 109/84 102/68 125/85 119/82  Pulse: 77 83 78 77  Resp: 18 18 18    Temp: 98 F (36.7 C) 98.3 F (36.8 C) 97.6 F (36.4 C) 98.3 F (36.8 C)  TempSrc:      SpO2: 100% 99% 98% 99%  Weight:      Height:       Weight change:   Physical Examination: General exam: alert awake,at baseline, older than stated age HEENT:Oral mucosa moist, Ear/Nose WNL grossly Respiratory system: Bilaterally clear BS,no use of accessory muscle Cardiovascular system: S1 & S2 +, No JVD. Gastrointestinal system: Abdomen soft,NT,ND, BS+ Nervous System: Alert, awake, moving all extremities,and following commands. Extremities: LE edema neg,distal peripheral pulses palpable and warm.  Skin: No rashes,no icterus. MSK: Normal muscle bulk,tone, power   Medications reviewed:  Scheduled Meds:  acetaminophen  650 mg Oral Q4H   enoxaparin (LOVENOX) injection  40 mg Subcutaneous Q24H   Ferrous Fumarate  1 tablet Oral Daily   gabapentin  300 mg Oral QHS   levothyroxine  100 mcg Oral QAC breakfast   senna-docusate  2 tablet Oral BID   sodium chloride flush  3 mL Intravenous Q12H   Continuous Infusions:   ceFAZolin (ANCEF) IV 2 g (09/25/23 1601)    Diet Order             Diet regular Room service appropriate? Yes; Fluid consistency: Thin  Diet effective now                  No intake or output data in the 24 hours ending 09/25/23  1249 Net IO Since Admission: 440 mL [09/25/23 1249]  Wt Readings from Last 3 Encounters:  09/22/23 70.3 kg  08/20/23 70.3 kg  02/10/22 73.9 kg     Unresulted Labs (From admission, onward)     Start     Ordered   09/23/23 1020  Glucose, Body Fluid Other  RELEASE UPON ORDERING,   STAT        09/23/23 1020   09/23/23 1020  Protein, body fluid (other)  RELEASE UPON ORDERING,   STAT        09/23/23 1020          Data Reviewed: I have personally reviewed following labs and imaging studies CBC: Recent Labs  Lab 09/22/23 0023 09/22/23 1326 09/23/23 0455 09/23/23 1300 09/24/23 0557 09/25/23 0518  WBC 12.0* 14.0* 10.9*  --  11.8* 11.3*  NEUTROABS  --  10.8*  --   --   --   --   HGB 9.2* 9.3* 7.8* 9.2* 8.6* 8.2*  HCT 28.7* 28.7* 24.3* 28.1* 27.4* 25.8*  MCV 92.6 90.5 94.6  --  95.5 93.5  PLT 367 410* 314  --  383 397   Basic Metabolic Panel:  Recent Labs  Lab 09/22/23 0023 09/22/23 1326 09/23/23 0455 09/24/23 0557  NA  --  136 136 136  K  --  3.7 3.4* 4.4  CL  --  102 104 106  CO2  --  21* 23 22  GLUCOSE  --  108* 101* 95  BUN  --  13 14 14   CREATININE 0.76 0.76 0.75 0.72  CALCIUM  --  8.8* 8.6* 8.5*  MG  --   --  2.0  --    GFR: Estimated Creatinine Clearance: 76.4 mL/min (by C-G formula based on SCr of 0.72 mg/dL). Liver Function Tests:  Recent Labs  Lab 09/22/23 1326  AST 29  ALT 26  ALKPHOS 68  BILITOT 0.9  PROT 7.1  ALBUMIN 2.6*   Recent Results (from the past 240 hours)  Blood culture (routine x 2)     Status: Abnormal   Collection Time: 09/22/23  4:50 PM   Specimen: BLOOD  Result Value Ref Range Status   Specimen Description   Final    BLOOD LEFT ANTECUBITAL Performed at [facility], [address], [place], [place] [address]    Special Requests   Final    BOTTLES DRAWN AEROBIC AND ANAEROBIC Blood Culture adequate volume Performed at [facility], [address] [place], [place] [address]    Culture   Setup Time   Final    GRAM POSITIVE COCCI IN CLUSTERS IN BOTH AEROBIC AND ANAEROBIC BOTTLES CRITICAL RESULT CALLED TO, READ BACK BY AND VERIFIED WITH: Katrine Coho [facility] [id number] AT 1134 BY EC Performed at [facility], [address] [place], [place] [address]    Culture STAPHYLOCOCCUS AUREUS (A)  Final   Report Status 09/25/2023 FINAL  Final   Organism ID, Bacteria STAPHYLOCOCCUS AUREUS  Final      Susceptibility   Staphylococcus aureus - MIC*    CIPROFLOXACIN <=0.5 SENSITIVE Sensitive     ERYTHROMYCIN <=0.25 SENSITIVE Sensitive     GENTAMICIN <=0.5 SENSITIVE Sensitive     OXACILLIN 0.5 SENSITIVE Sensitive     TETRACYCLINE <=1 SENSITIVE Sensitive     VANCOMYCIN <=0.5 SENSITIVE Sensitive     TRIMETH/SULFA <=10 SENSITIVE Sensitive     CLINDAMYCIN <=0.25 SENSITIVE Sensitive     RIFAMPIN <=0.5 SENSITIVE Sensitive     Inducible Clindamycin NEGATIVE Sensitive     LINEZOLID 2 SENSITIVE Sensitive     * STAPHYLOCOCCUS AUREUS  Blood Culture ID Panel (Reflexed)     Status: Abnormal   Collection Time: 09/22/23  4:50 PM  Result Value Ref Range Status   Enterococcus faecalis NOT DETECTED NOT DETECTED Final   Enterococcus Faecium NOT DETECTED NOT DETECTED Final   Listeria monocytogenes NOT DETECTED NOT DETECTED Final   Staphylococcus species DETECTED (A) NOT DETECTED Final    Comment: CRITICAL RESULT CALLED TO, READ BACK BY AND VERIFIED WITH: PHARMD MARY SWAYNE [id number] AT 1134 BY EC    Staphylococcus aureus (BCID) DETECTED (A) NOT DETECTED Final    Comment: CRITICAL RESULT CALLED TO, READ BACK BY AND VERIFIED WITH: PHARMD MARY SWAYNE [id number] AT 1134 BY EC    Staphylococcus epidermidis NOT DETECTED NOT DETECTED Final   Staphylococcus lugdunensis NOT DETECTED NOT DETECTED Final   Streptococcus species NOT DETECTED NOT DETECTED Final   Streptococcus agalactiae NOT DETECTED NOT DETECTED Final   Streptococcus pneumoniae NOT DETECTED NOT DETECTED Final   Streptococcus pyogenes NOT  DETECTED NOT DETECTED Final   A.calcoaceticus-baumannii NOT DETECTED NOT DETECTED Final   Bacteroides fragilis NOT DETECTED NOT DETECTED Final   Enterobacterales NOT DETECTED NOT DETECTED Final   Enterobacter cloacae complex NOT DETECTED NOT DETECTED Final   Escherichia coli NOT DETECTED NOT DETECTED Final   Klebsiella aerogenes NOT DETECTED NOT DETECTED Final   Klebsiella oxytoca NOT DETECTED NOT DETECTED Final   Klebsiella pneumoniae NOT DETECTED NOT DETECTED Final   Proteus species NOT DETECTED NOT DETECTED Final   Salmonella species NOT DETECTED NOT DETECTED Final   Serratia marcescens NOT DETECTED NOT DETECTED Final   Haemophilus influenzae NOT DETECTED NOT DETECTED Final   Neisseria meningitidis NOT DETECTED NOT DETECTED Final   Pseudomonas aeruginosa NOT DETECTED NOT DETECTED Final   Stenotrophomonas maltophilia NOT DETECTED NOT DETECTED Final   Candida albicans NOT DETECTED NOT DETECTED Final   Candida auris NOT DETECTED NOT DETECTED Final   Candida glabrata NOT DETECTED NOT DETECTED Final   Candida krusei NOT DETECTED NOT DETECTED Final   Candida parapsilosis NOT DETECTED NOT DETECTED Final   Candida tropicalis NOT DETECTED NOT DETECTED Final   Cryptococcus neoformans/gattii NOT DETECTED NOT DETECTED Final   Meth resistant mecA/C and MREJ NOT DETECTED NOT DETECTED Final    Comment: Performed at [facility], [address] [place], [place] [address]  Blood culture (routine x 2)     Status: Abnormal   Collection Time: 09/22/23  5:07 PM   Specimen: BLOOD  Result Value Ref Range Status   Specimen Description   Final    BLOOD RIGHT ANTECUBITAL Performed at [facility], [address] [place], [place] [address]    Special Requests   Final    BOTTLES DRAWN AEROBIC AND ANAEROBIC Blood Culture adequate volume Performed at [facility], [address], [place], [place] [address]    Culture  Setup Time   Final    GRAM POSITIVE COCCI  IN CLUSTERS IN BOTH AEROBIC AND ANAEROBIC BOTTLES CRITICAL VALUE NOTED.  VALUE IS CONSISTENT WITH PREVIOUSLY REPORTED AND CALLED VALUE.    Culture (A)  Final    STAPHYLOCOCCUS AUREUS SUSCEPTIBILITIES PERFORMED ON PREVIOUS CULTURE WITHIN THE LAST 5 DAYS. Performed at [facility], [address] [place], [place] [address]    Report Status 09/25/2023 FINAL  Final  Anaerobic culture w Gram Stain     Status: None (Preliminary result)   Collection Time: 09/23/23 10:19 AM   Specimen: Joint, Right Hip; Synovial Fluid  Result Value Ref Range Status   Specimen Description   Final    SYNOVIAL Performed at [facility], [address] [place], [place] [address]    Special Requests   Final    NONE RIGHT HIP Performed at [facility], [address], [place], [place] [address]    Gram Stain   Final    MODERATE WBC PRESENT,BOTH PMN AND MONONUCLEAR FEW GRAM POSITIVE COCCI IN CLUSTERS Performed at [facility], [address], [place], [place] [address]    Culture PENDING  Incomplete   Report Status PENDING  Incomplete  Culture, blood (Routine X 2) w Reflex to ID Panel     Status: None (Preliminary result)   Collection Time: 09/24/23  5:57 AM   Specimen: BLOOD RIGHT ARM  Result Value Ref Range Status   Specimen Description   Final    BLOOD RIGHT ARM Performed at [facility], [address] [place], [place] [address]    Special Requests   Final    BOTTLES DRAWN AEROBIC AND ANAEROBIC Blood Culture results may not be optimal due to an inadequate volume of blood received in culture bottles Performed at [facility], [address] [place], [place] [address]    Culture   Final    NO GROWTH < 24 HOURS Performed at [facility], [address] [place], [place] [address]    Report Status PENDING  Incomplete  Culture, blood (Routine X 2) w Reflex to ID Panel     Status: None (Preliminary result)   Collection  Time: 09/24/23  6:04 AM   Specimen: BLOOD RIGHT ARM  Result Value Ref Range Status   Specimen Description   Final    BLOOD RIGHT ARM Performed at [facility], [address], [place], [place] [address]    Special Requests   Final    BOTTLES DRAWN AEROBIC AND ANAEROBIC Blood Culture adequate volume Performed at [facility], [address], [place], [place] [address]    Culture   Final    NO GROWTH < 24 HOURS Performed at [facility], [address] [place], [place] [address]    Report Status PENDING  Incomplete    Antimicrobials/Microbiology: Anti-infectives (From admission, onward)    Start     Dose/Rate Route Frequency Ordered Stop   09/23/23 1530  ceFAZolin (ANCEF) IVPB 2g/100 mL premix        2 g 200 mL/hr over 30 Minutes Intravenous Every 8 hours 09/23/23 1433     09/23/23 1130  cefTRIAXone (ROCEPHIN) 2 g in sodium chloride 0.9 % 100 mL IVPB  Status:  Discontinued        2 g 200 mL/hr over 30 Minutes Intravenous Daily 09/23/23 1112 09/23/23 1432   09/23/23 1115  vancomycin (VANCOREADY) IVPB 1500  mg/300 mL  Status:  Discontinued        1,500 mg 150 mL/hr over 120 Minutes Intravenous  Once 09/23/23 1112 09/23/23 1432         Component Value Date/Time   SDES  09/24/2023 0604    BLOOD RIGHT ARM Performed at [facility], [address] [place], [place] [address]    SPECREQUEST  09/24/2023 0604    BOTTLES DRAWN AEROBIC AND ANAEROBIC Blood Culture adequate volume Performed at [facility], [address], [place], [place] [address]    CULT  09/24/2023 0604    NO GROWTH < 24 HOURS Performed at [facility], [address], [place], [place] [address]    REPTSTATUS PENDING 09/24/2023 9147     Radiology Studies: ECHOCARDIOGRAM COMPLETE Result Date: 09/24/2023    ECHOCARDIOGRAM REPORT   Patient Name:   [facility] Date of Exam: 09/24/2023 Medical Rec #:  [id number]       Height:       68.0 in Accession #:     [id number]      Weight:       155.0 lb Date of Birth:  01-13-1966        BSA:          1.834 m Patient Age:    57 years        BP:           118/70 mmHg Patient Gender: F               HR:           72 bpm. Exam Location:  Inpatient Procedure: 2D Echo, Color Doppler and Cardiac Doppler Indications:    Bacteremia R78.81  History:        Patient has no prior history of Echocardiogram examinations.  Sonographer:    Harriette Bouillon RDCS Referring Phys: [id number] Kasandra Knudsen PAHWANI IMPRESSIONS  1. Left ventricular ejection fraction, by estimation, is 60 to 65%. The left ventricle has normal function. The left ventricle has no regional wall motion abnormalities. There is mild left ventricular hypertrophy. Left ventricular diastolic parameters are consistent with Grade I diastolic dysfunction (impaired relaxation).  2. Right ventricular systolic function is low normal. The right ventricular size is normal. There is normal pulmonary artery systolic pressure. The estimated right ventricular systolic pressure is 17.4 mmHg.  3. A small pericardial effusion is present. The pericardial effusion is localized near the right ventricle.  4. The mitral valve is grossly normal. Trivial mitral valve regurgitation.  5. The aortic valve is tricuspid. Aortic valve regurgitation is not visualized.  6. The inferior vena cava is normal in size with greater than 50% respiratory variability, suggesting right atrial pressure of 3 mmHg.  7. Cannot exclude a small PFO. Comparison(s): No prior Echocardiogram. Conclusion(s)/Recommendation(s): No evidence of valvular vegetations on this transthoracic echocardiogram. Consider a transesophageal echocardiogram to exclude infective endocarditis if clinically indicated. FINDINGS  Left Ventricle: Left ventricular ejection fraction, by estimation, is 60 to 65%. The left ventricle has normal function. The left ventricle has no regional wall motion abnormalities. The left ventricular internal cavity size was  normal in size. There is  mild left ventricular hypertrophy. Left ventricular diastolic parameters are consistent with Grade I diastolic dysfunction (impaired relaxation). Indeterminate filling pressures. Right Ventricle: The right ventricular size is normal. No increase in right ventricular wall thickness. Right ventricular systolic function is low normal. There is normal pulmonary artery systolic pressure. The tricuspid regurgitant velocity  is 1.90 m/s,  and with an assumed right atrial pressure of 3 mmHg, the estimated right ventricular systolic pressure is 17.4 mmHg. Left Atrium: Left atrial size was normal in size. Right Atrium: Right atrial size was normal in size. Pericardium: A small pericardial effusion is present. The pericardial effusion is localized near the right ventricle. Mitral Valve: The mitral valve is grossly normal. Trivial mitral valve regurgitation. Tricuspid Valve: The tricuspid valve is grossly normal. Tricuspid valve regurgitation is mild. Aortic Valve: The aortic valve is tricuspid. Aortic valve regurgitation is not visualized. Pulmonic Valve: The pulmonic valve was not well visualized. Pulmonic valve regurgitation is not visualized. Aorta: The aortic root and ascending aorta are structurally normal, with no evidence of dilitation. Venous: The inferior vena cava is normal in size with greater than 50% respiratory variability, suggesting right atrial pressure of 3 mmHg. IAS/Shunts: The interatrial septum is aneurysmal. Cannot exclude a small PFO.  LEFT VENTRICLE PLAX 2D LVIDd:         4.80 cm Diastology LVIDs:         3.30 cm LV e' medial:    9.57 cm/s LV PW:         1.10 cm LV E/e' medial:  6.5 LV IVS:        1.10 cm LV e' lateral:   10.30 cm/s                        LV E/e' lateral: 6.0  RIGHT VENTRICLE             IVC RV S prime:     10.80 cm/s  IVC diam: 1.60 cm TAPSE (M-mode): 2.3 cm LEFT ATRIUM           Index        RIGHT ATRIUM           Index LA diam:      3.70 cm 2.02 cm/m   RA  Area:     16.80 cm LA Vol (A4C): 49.9 ml 27.21 ml/m  RA Volume:   44.90 ml  24.48 ml/m  AORTIC VALVE LVOT Vmax:   72.50 cm/s LVOT Vmean:  50.800 cm/s LVOT VTI:    0.145 m MITRAL VALVE               TRICUSPID VALVE MV Area (PHT): 4.19 cm    TR Peak grad:   14.4 mmHg MV Decel Time: 181 msec    TR Vmax:        190.00 cm/s MV E velocity: 62.10 cm/s MV A velocity: 69.00 cm/s  SHUNTS MV E/A ratio:  0.90        Systemic VTI: 0.14 m Zoila Shutter MD Electronically signed by Zoila Shutter MD Signature Date/Time: 09/24/2023/1:41:43 PM    Final      LOS: 3 days   Total time spent in review of labs and imaging, patient evaluation, formulation of plan, documentation and communication with family: 35 minutes  Lanae Boast, MD  [facility]  09/25/2023, 12:49 PM

## 2023-09-25 NOTE — Plan of Care (Signed)
  Problem: Education: Goal: Knowledge of General Education information will improve Description: Including pain rating scale, medication(s)/side effects and non-pharmacologic comfort measures Outcome: Progressing   Problem: Health Behavior/Discharge Planning: Goal: Ability to manage health-related needs will improve Outcome: Progressing   Problem: Nutrition: Goal: Adequate nutrition will be maintained Outcome: Progressing   Problem: Coping: Goal: Level of anxiety will decrease Outcome: Progressing   Problem: Pain Managment: Goal: General experience of comfort will improve and/or be controlled Outcome: Progressing   Problem: Safety: Goal: Ability to remain free from injury will improve Outcome: Progressing   Problem: Skin Integrity: Goal: Risk for impaired skin integrity will decrease Outcome: Progressing

## 2023-09-25 NOTE — Progress Notes (Signed)
   09/25/23 1027  TOC Brief Assessment  Insurance and Status Reviewed  Patient has primary care physician Yes  Home environment has been reviewed single family home w/ spouse  Prior level of function: independent  Prior/Current Home Services No current home services  Social Drivers of Health Review SDOH reviewed no interventions necessary  Readmission risk has been reviewed Yes  Transition of care needs transition of care needs identified, TOC will continue to follow

## 2023-09-25 NOTE — Hospital Course (Addendum)
58 year old with no significant past medical history presented to ER with worsening right hip pain for nearly 1-1/57-month refractory to medications and intra-articular injection, now inability to ambulate. Stat MRI of the hip was obtained at [facility] concerning for septic arthritis. Patient was seen by Dr. Linna Caprice in the office and was recommended ER for admission for right hip septic arthritis. S/p IR guided joint aspiration in 2/1, ID has been consulted, blood culture positive for MSSA

## 2023-09-25 NOTE — Consult Note (Signed)
Subjective: No new complaints   Antibiotics:  Anti-infectives (From admission, onward)    Start     Dose/Rate Route Frequency Ordered Stop   09/23/23 1530  ceFAZolin (ANCEF) IVPB 2g/100 mL premix        2 g 200 mL/hr over 30 Minutes Intravenous Every 8 hours 09/23/23 1433     09/23/23 1130  cefTRIAXone (ROCEPHIN) 2 g in sodium chloride 0.9 % 100 mL IVPB  Status:  Discontinued        2 g 200 mL/hr over 30 Minutes Intravenous Daily 09/23/23 1112 09/23/23 1432   09/23/23 1115  vancomycin (VANCOREADY) IVPB 1500 mg/300 mL  Status:  Discontinued        1,500 mg 150 mL/hr over 120 Minutes Intravenous  Once 09/23/23 1112 09/23/23 1432       Medications: Scheduled Meds:  acetaminophen  650 mg Oral Q4H   enoxaparin (LOVENOX) injection  40 mg Subcutaneous Q24H   Ferrous Fumarate  1 tablet Oral Daily   gabapentin  300 mg Oral QHS   levothyroxine  100 mcg Oral QAC breakfast   senna-docusate  2 tablet Oral BID   sodium chloride flush  3 mL Intravenous Q12H   Continuous Infusions:   ceFAZolin (ANCEF) IV 2 g (09/25/23 0627)   PRN Meds:.HYDROmorphone (DILAUDID) injection, morphine, polyethylene glycol    Objective: Weight change:  No intake or output data in the 24 hours ending 09/25/23 1424 Blood pressure 119/82, pulse 77, temperature 98.3 F (36.8 C), resp. rate 18, height 5\' 8"  (1.727 m), weight 70.3 kg, SpO2 99%. Temp:  [97.6 F (36.4 C)-98.3 F (36.8 C)] 98.3 F (36.8 C) (02/03 1206) Pulse Rate:  [77-83] 77 (02/03 1206) Resp:  [18] 18 (02/03 0445) BP: (102-125)/(68-85) 119/82 (02/03 1206) SpO2:  [98 %-99 %] 99 % (02/03 1206)  Physical Exam: Physical Exam Constitutional:      General: She is not in acute distress.    Appearance: She is well-developed. She is not diaphoretic.  HENT:     Head: Normocephalic and atraumatic.     Right Ear: External ear normal.     Left Ear: External ear normal.     Mouth/Throat:     Pharynx: No oropharyngeal exudate.  Eyes:      General: No scleral icterus.    Conjunctiva/sclera: Conjunctivae normal.     Pupils: Pupils are equal, round, and reactive to light.  Cardiovascular:     Rate and Rhythm: Normal rate and regular rhythm.  Pulmonary:     Effort: Pulmonary effort is normal. No respiratory distress.     Breath sounds: Normal breath sounds. No wheezing.  Abdominal:     General: There is no distension.     Palpations: Abdomen is soft.  Lymphadenopathy:     Cervical: No cervical adenopathy.  Skin:    General: Skin is warm and dry.     Coloration: Skin is not pale.     Findings: No erythema or rash.  Neurological:     General: No focal deficit present.     Mental Status: She is alert and oriented to person, place, and time.     Motor: No abnormal muscle tone.     Coordination: Coordination normal.  Psychiatric:        Mood and Affect: Mood normal.        Behavior: Behavior normal.        Thought Content: Thought content normal.  Judgment: Judgment normal.      CBC:    BMET Recent Labs    09/23/23 0455 09/24/23 0557  NA 136 136  K 3.4* 4.4  CL 104 106  CO2 23 22  GLUCOSE 101* 95  BUN 14 14  CREATININE 0.75 0.72  CALCIUM 8.6* 8.5*     Liver Panel  No results for input(s): "PROT", "ALBUMIN", "AST", "ALT", "ALKPHOS", "BILITOT", "BILIDIR", "IBILI" in the last 72 hours.     Sedimentation Rate Recent Labs    09/25/23 0518  ESRSEDRATE 134*   C-Reactive Protein Recent Labs    09/24/23 0557 09/25/23 0518  CRP 22.6* 20.5*    Micro Results: Recent Results (from the past 720 hours)  Blood culture (routine x 2)     Status: Abnormal   Collection Time: 09/22/23  4:50 PM   Specimen: BLOOD  Result Value Ref Range Status   Specimen Description   Final    BLOOD LEFT ANTECUBITAL Performed at [facility], [address] [place], [place] [address]    Special Requests   Final    BOTTLES DRAWN AEROBIC AND ANAEROBIC Blood Culture adequate  volume Performed at [facility], [address] [place], [place] [address]    Culture  Setup Time   Final    GRAM POSITIVE COCCI IN CLUSTERS IN BOTH AEROBIC AND ANAEROBIC BOTTLES CRITICAL RESULT CALLED TO, READ BACK BY AND VERIFIED WITH: Katrine Coho [facility] [id number] AT 1134 BY EC Performed at [facility], [address] [place], [place] [address]    Culture STAPHYLOCOCCUS AUREUS (A)  Final   Report Status 09/25/2023 FINAL  Final   Organism ID, Bacteria STAPHYLOCOCCUS AUREUS  Final      Susceptibility   Staphylococcus aureus - MIC*    CIPROFLOXACIN <=0.5 SENSITIVE Sensitive     ERYTHROMYCIN <=0.25 SENSITIVE Sensitive     GENTAMICIN <=0.5 SENSITIVE Sensitive     OXACILLIN 0.5 SENSITIVE Sensitive     TETRACYCLINE <=1 SENSITIVE Sensitive     VANCOMYCIN <=0.5 SENSITIVE Sensitive     TRIMETH/SULFA <=10 SENSITIVE Sensitive     CLINDAMYCIN <=0.25 SENSITIVE Sensitive     RIFAMPIN <=0.5 SENSITIVE Sensitive     Inducible Clindamycin NEGATIVE Sensitive     LINEZOLID 2 SENSITIVE Sensitive     * STAPHYLOCOCCUS AUREUS  Blood Culture ID Panel (Reflexed)     Status: Abnormal   Collection Time: 09/22/23  4:50 PM  Result Value Ref Range Status   Enterococcus faecalis NOT DETECTED NOT DETECTED Final   Enterococcus Faecium NOT DETECTED NOT DETECTED Final   Listeria monocytogenes NOT DETECTED NOT DETECTED Final   Staphylococcus species DETECTED (A) NOT DETECTED Final    Comment: CRITICAL RESULT CALLED TO, READ BACK BY AND VERIFIED WITH: PHARMD MARY SWAYNE [id number] AT 1134 BY EC    Staphylococcus aureus (BCID) DETECTED (A) NOT DETECTED Final    Comment: CRITICAL RESULT CALLED TO, READ BACK BY AND VERIFIED WITH: PHARMD MARY SWAYNE [id number] AT 1134 BY EC    Staphylococcus epidermidis NOT DETECTED NOT DETECTED Final   Staphylococcus lugdunensis NOT DETECTED NOT DETECTED Final   Streptococcus species NOT DETECTED NOT DETECTED Final   Streptococcus agalactiae NOT DETECTED  NOT DETECTED Final   Streptococcus pneumoniae NOT DETECTED NOT DETECTED Final   Streptococcus pyogenes NOT DETECTED NOT DETECTED Final   A.calcoaceticus-baumannii NOT DETECTED NOT DETECTED Final   Bacteroides fragilis NOT DETECTED NOT DETECTED Final   Enterobacterales NOT DETECTED NOT DETECTED Final  Enterobacter cloacae complex NOT DETECTED NOT DETECTED Final   Escherichia coli NOT DETECTED NOT DETECTED Final   Klebsiella aerogenes NOT DETECTED NOT DETECTED Final   Klebsiella oxytoca NOT DETECTED NOT DETECTED Final   Klebsiella pneumoniae NOT DETECTED NOT DETECTED Final   Proteus species NOT DETECTED NOT DETECTED Final   Salmonella species NOT DETECTED NOT DETECTED Final   Serratia marcescens NOT DETECTED NOT DETECTED Final   Haemophilus influenzae NOT DETECTED NOT DETECTED Final   Neisseria meningitidis NOT DETECTED NOT DETECTED Final   Pseudomonas aeruginosa NOT DETECTED NOT DETECTED Final   Stenotrophomonas maltophilia NOT DETECTED NOT DETECTED Final   Candida albicans NOT DETECTED NOT DETECTED Final   Candida auris NOT DETECTED NOT DETECTED Final   Candida glabrata NOT DETECTED NOT DETECTED Final   Candida krusei NOT DETECTED NOT DETECTED Final   Candida parapsilosis NOT DETECTED NOT DETECTED Final   Candida tropicalis NOT DETECTED NOT DETECTED Final   Cryptococcus neoformans/gattii NOT DETECTED NOT DETECTED Final   Meth resistant mecA/C and MREJ NOT DETECTED NOT DETECTED Final    Comment: Performed at [facility], [address], [place], [place] [address]  Blood culture (routine x 2)     Status: Abnormal   Collection Time: 09/22/23  5:07 PM   Specimen: BLOOD  Result Value Ref Range Status   Specimen Description   Final    BLOOD RIGHT ANTECUBITAL Performed at [facility], [address], [place], [place] [address]    Special Requests   Final    BOTTLES DRAWN AEROBIC AND ANAEROBIC Blood Culture adequate volume Performed at [facility], [address] [place], [place] [address]    Culture  Setup Time   Final    GRAM POSITIVE COCCI IN CLUSTERS IN BOTH AEROBIC AND ANAEROBIC BOTTLES CRITICAL VALUE NOTED.  VALUE IS CONSISTENT WITH PREVIOUSLY REPORTED AND CALLED VALUE.    Culture (A)  Final    STAPHYLOCOCCUS AUREUS SUSCEPTIBILITIES PERFORMED ON PREVIOUS CULTURE WITHIN THE LAST 5 DAYS. Performed at [facility], [address], [place], [place] [address]    Report Status 09/25/2023 FINAL  Final  Anaerobic culture w Gram Stain     Status: None (Preliminary result)   Collection Time: 09/23/23 10:19 AM   Specimen: Joint, Right Hip; Synovial Fluid  Result Value Ref Range Status   Specimen Description   Final    SYNOVIAL Performed at [facility], [address], [place], [place] [address]    Special Requests   Final    NONE RIGHT HIP Performed at [facility], [address] [place], [place] [address]    Gram Stain   Final    MODERATE WBC PRESENT,BOTH PMN AND MONONUCLEAR FEW GRAM POSITIVE COCCI IN CLUSTERS Performed at [facility], [address], [place], [place] [address]    Culture   Final    NO ANAEROBES ISOLATED; CULTURE IN PROGRESS FOR 5 DAYS   Report Status PENDING  Incomplete  Culture, blood (Routine X 2) w Reflex to ID Panel     Status: None (Preliminary result)   Collection Time: 09/24/23  5:57 AM   Specimen: BLOOD RIGHT ARM  Result Value Ref Range Status   Specimen Description   Final    BLOOD RIGHT ARM Performed at [facility], [address] [place], [place] [address]    Special Requests   Final    BOTTLES DRAWN AEROBIC AND ANAEROBIC Blood  Culture results may not be optimal due to an inadequate volume of blood received in culture bottles Performed at [facility], [address] [place], [place] [address]    Culture   Final    NO GROWTH < 24 HOURS Performed at [facility], [address],  [place], [place] [address]    Report Status PENDING  Incomplete  Culture, blood (Routine X 2) w Reflex to ID Panel     Status: None (Preliminary result)   Collection Time: 09/24/23  6:04 AM   Specimen: BLOOD RIGHT ARM  Result Value Ref Range Status   Specimen Description   Final    BLOOD RIGHT ARM Performed at [facility], [address] [place], [place] [address]    Special Requests   Final    BOTTLES DRAWN AEROBIC AND ANAEROBIC Blood Culture adequate volume Performed at [facility], [address], [place], [place] [address]    Culture   Final    NO GROWTH < 24 HOURS Performed at [facility], [address] [place], [place] [address]    Report Status PENDING  Incomplete    Studies/Results: ECHOCARDIOGRAM COMPLETE Result Date: 09/24/2023    ECHOCARDIOGRAM REPORT   Patient Name:   [facility] Autry Date of Exam: 09/24/2023 Medical Rec #:  [id number]       Height:       68.0 in Accession #:    [id number]      Weight:       155.0 lb Date of Birth:  February 17, 1966        BSA:          1.834 m Patient Age:    57 years        BP:           118/70 mmHg Patient Gender: F               HR:           72 bpm. Exam Location:  Inpatient Procedure: 2D Echo, Color Doppler and Cardiac Doppler Indications:    Bacteremia R78.81  History:        Patient has no prior history of Echocardiogram examinations.  Sonographer:    Harriette Bouillon RDCS Referring Phys: [id number] Kasandra Knudsen PAHWANI IMPRESSIONS  1. Left ventricular ejection fraction, by estimation, is 60 to 65%. The left ventricle has normal function. The left ventricle has no regional wall motion abnormalities. There is mild left ventricular hypertrophy. Left ventricular diastolic parameters are consistent with Grade I diastolic dysfunction (impaired relaxation).  2. Right ventricular systolic function is low normal. The right ventricular size is normal. There is normal pulmonary artery systolic pressure. The estimated right ventricular  systolic pressure is 17.4 mmHg.  3. A small pericardial effusion is present. The pericardial effusion is localized near the right ventricle.  4. The mitral valve is grossly normal. Trivial mitral valve regurgitation.  5. The aortic valve is tricuspid. Aortic valve regurgitation is not visualized.  6. The inferior vena cava is normal in size with greater than 50% respiratory variability, suggesting right atrial pressure of 3 mmHg.  7. Cannot exclude a small PFO. Comparison(s): No prior Echocardiogram. Conclusion(s)/Recommendation(s): No evidence of valvular vegetations on this transthoracic echocardiogram. Consider a transesophageal echocardiogram to exclude infective endocarditis if clinically indicated. FINDINGS  Left Ventricle: Left ventricular ejection fraction, by estimation, is 60 to 65%. The left ventricle has normal function. The left ventricle has no regional wall  motion abnormalities. The left ventricular internal cavity size was normal in size. There is  mild left ventricular hypertrophy. Left ventricular diastolic parameters are consistent with Grade I diastolic dysfunction (impaired relaxation). Indeterminate filling pressures. Right Ventricle: The right ventricular size is normal. No increase in right ventricular wall thickness. Right ventricular systolic function is low normal. There is normal pulmonary artery systolic pressure. The tricuspid regurgitant velocity is 1.90 m/s,  and with an assumed right atrial pressure of 3 mmHg, the estimated right ventricular systolic pressure is 17.4 mmHg. Left Atrium: Left atrial size was normal in size. Right Atrium: Right atrial size was normal in size. Pericardium: A small pericardial effusion is present. The pericardial effusion is localized near the right ventricle. Mitral Valve: The mitral valve is grossly normal. Trivial mitral valve regurgitation. Tricuspid Valve: The tricuspid valve is grossly normal. Tricuspid valve regurgitation is mild. Aortic Valve: The  aortic valve is tricuspid. Aortic valve regurgitation is not visualized. Pulmonic Valve: The pulmonic valve was not well visualized. Pulmonic valve regurgitation is not visualized. Aorta: The aortic root and ascending aorta are structurally normal, with no evidence of dilitation. Venous: The inferior vena cava is normal in size with greater than 50% respiratory variability, suggesting right atrial pressure of 3 mmHg. IAS/Shunts: The interatrial septum is aneurysmal. Cannot exclude a small PFO.  LEFT VENTRICLE PLAX 2D LVIDd:         4.80 cm Diastology LVIDs:         3.30 cm LV e' medial:    9.57 cm/s LV PW:         1.10 cm LV E/e' medial:  6.5 LV IVS:        1.10 cm LV e' lateral:   10.30 cm/s                        LV E/e' lateral: 6.0  RIGHT VENTRICLE             IVC RV S prime:     10.80 cm/s  IVC diam: 1.60 cm TAPSE (M-mode): 2.3 cm LEFT ATRIUM           Index        RIGHT ATRIUM           Index LA diam:      3.70 cm 2.02 cm/m   RA Area:     16.80 cm LA Vol (A4C): 49.9 ml 27.21 ml/m  RA Volume:   44.90 ml  24.48 ml/m  AORTIC VALVE LVOT Vmax:   72.50 cm/s LVOT Vmean:  50.800 cm/s LVOT VTI:    0.145 m MITRAL VALVE               TRICUSPID VALVE MV Area (PHT): 4.19 cm    TR Peak grad:   14.4 mmHg MV Decel Time: 181 msec    TR Vmax:        190.00 cm/s MV E velocity: 62.10 cm/s MV A velocity: 69.00 cm/s  SHUNTS MV E/A ratio:  0.90        Systemic VTI: 0.14 m Zoila Shutter MD Electronically signed by Zoila Shutter MD Signature Date/Time: 09/24/2023/1:41:43 PM    Final       Assessment/Plan:  INTERVAL HISTORY: TTE performed   Principal Problem:   Septic arthritis (HCC) Active Problems:   Hypothyroidism (acquired)   Pain   MSSA bacteremia    Melanie Li is a 58 y.o. female with with history of hypothyroidism who underwent corticosteroid  injection after a injury while playing pickle ball.  Ultimately after some initial benefit from the injection she had worsening pain which made it difficult for  her to even ambulate.  She had workup done for radicular pain with MRI of the spine which was unrevealing and then an MRI of the hip that showed of septic arthritis with a large joint fluid collection as well as destruction of cartilage and bone having been seen.  Was sent to the emergency department by Dr. Linna Caprice due to concerns about her septic hip and desire to have this aspirated and have antibiotics started.  Blood cultures taken on admission have subsequent yielded MSSA aspirate of the joint shows gram-positive cocci in clusters which is undoubtedly the same MSSA she is scheduled for surgery later this week on Wednesday  2D echocardiogram does not show evidence of endocarditis.  Repeat blood cultures have been taken.  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 Professional time spent includes the following activities: Preparing to see the patient (review of tests), Obtaining and/or reviewing separately obtained history (admission/discharge record), Performing a medically appropriate examination and/or evaluation , Ordering medications/tests/procedures, referring and communicating with other health care professionals, Documenting clinical information in the EMR, Independently interpreting results (not separately reported), Communicating results to the patient/family/caregiver, Counseling and educating the patient/family/caregiver and Care coordination (not separately reported).   Evaluation of the patient requires complex antimicrobial therapy evaluation, counseling ,  isolation needs to reduce disease transmission and risk assessment and mitigation.     LOS: 3 days   Melanie Li 09/25/2023, 2:24 PM

## 2023-09-25 NOTE — Plan of Care (Signed)
  Problem: Education: Goal: Knowledge of General Education information will improve Description: Including pain rating scale, medication(s)/side effects and non-pharmacologic comfort measures Outcome: Progressing   Problem: Nutrition: Goal: Adequate nutrition will be maintained Outcome: Progressing   Problem: Coping: Goal: Level of anxiety will decrease Outcome: Progressing   Problem: Safety: Goal: Ability to remain free from injury will improve Outcome: Progressing   Problem: Skin Integrity: Goal: Risk for impaired skin integrity will decrease Outcome: Progressing   Problem: Activity: Goal: Risk for activity intolerance will decrease Outcome: Not Progressing   Problem: Pain Managment: Goal: General experience of comfort will improve and/or be controlled Outcome: Not Progressing

## 2023-09-26 DIAGNOSIS — R7881 Bacteremia: Secondary | ICD-10-CM | POA: Diagnosis not present

## 2023-09-26 DIAGNOSIS — M25551 Pain in right hip: Secondary | ICD-10-CM | POA: Diagnosis not present

## 2023-09-26 DIAGNOSIS — M25552 Pain in left hip: Secondary | ICD-10-CM | POA: Diagnosis not present

## 2023-09-26 DIAGNOSIS — M00051 Staphylococcal arthritis, right hip: Secondary | ICD-10-CM | POA: Diagnosis not present

## 2023-09-26 LAB — CBC
HCT: 25.6 % — ABNORMAL LOW (ref 36.0–46.0)
Hemoglobin: 8 g/dL — ABNORMAL LOW (ref 12.0–15.0)
MCH: 29.3 pg (ref 26.0–34.0)
MCHC: 31.3 g/dL (ref 30.0–36.0)
MCV: 93.8 fL (ref 80.0–100.0)
Platelets: 493 10*3/uL — ABNORMAL HIGH (ref 150–400)
RBC: 2.73 MIL/uL — ABNORMAL LOW (ref 3.87–5.11)
RDW: 12.5 % (ref 11.5–15.5)
WBC: 12.8 10*3/uL — ABNORMAL HIGH (ref 4.0–10.5)
nRBC: 0 % (ref 0.0–0.2)

## 2023-09-26 LAB — PROTEIN, BODY FLUID (OTHER)

## 2023-09-26 LAB — BASIC METABOLIC PANEL
Anion gap: 10 (ref 5–15)
BUN: 12 mg/dL (ref 6–20)
CO2: 23 mmol/L (ref 22–32)
Calcium: 8.6 mg/dL — ABNORMAL LOW (ref 8.9–10.3)
Chloride: 104 mmol/L (ref 98–111)
Creatinine, Ser: 0.68 mg/dL (ref 0.44–1.00)
GFR, Estimated: >60 mL/min (ref >60)
Glucose, Bld: 100 mg/dL — ABNORMAL HIGH (ref 70–99)
Potassium: 3.7 mmol/L (ref 3.5–5.1)
Sodium: 137 mmol/L (ref 135–145)

## 2023-09-26 LAB — C-REACTIVE PROTEIN: CRP: 16.1 mg/dL — ABNORMAL HIGH (ref <1.0)

## 2023-09-26 LAB — HEPATITIS B SURFACE ANTIGEN: Hepatitis B Surface Ag: NONREACTIVE

## 2023-09-26 LAB — SEDIMENTATION RATE: Sed Rate: 123 mm/h — ABNORMAL HIGH (ref 0–22)

## 2023-09-26 LAB — HEPATITIS A ANTIBODY, TOTAL: hep A Total Ab: REACTIVE — AB

## 2023-09-26 NOTE — Progress Notes (Signed)
 Subjective: Patient having significant lower back pain and hip pain while trying to change position in bed   Antibiotics:  Anti-infectives (From admission, onward)    Start     Dose/Rate Route Frequency Ordered Stop   09/23/23 1530  ceFAZolin  (ANCEF ) IVPB 2g/100 mL premix        2 g 200 mL/hr over 30 Minutes Intravenous Every 8 hours 09/23/23 1433     09/23/23 1130  cefTRIAXone  (ROCEPHIN ) 2 g in sodium chloride  0.9 % 100 mL IVPB  Status:  Discontinued        2 g 200 mL/hr over 30 Minutes Intravenous Daily 09/23/23 1112 09/23/23 1432   09/23/23 1115  vancomycin  (VANCOREADY) IVPB 1500 mg/300 mL  Status:  Discontinued        1,500 mg 150 mL/hr over 120 Minutes Intravenous  Once 09/23/23 1112 09/23/23 1432       Medications: Scheduled Meds:  Ferrous Fumarate   1 tablet Oral Daily   gabapentin   300 mg Oral QHS   levothyroxine   100 mcg Oral QAC breakfast   senna-docusate  2 tablet Oral BID   sodium chloride  flush  3 mL Intravenous Q12H   Continuous Infusions:   ceFAZolin  (ANCEF ) IV 2 g (09/26/23 1401)   PRN Meds:.HYDROmorphone  (DILAUDID ) injection, morphine , polyethylene glycol    Objective: Weight change:   Intake/Output Summary (Last 24 hours) at 09/26/2023 1606 Last data filed at 09/26/2023 1500 Gross per 24 hour  Intake 360 ml  Output 400 ml  Net -40 ml   Blood pressure 113/77, pulse 80, temperature 99.1 F (37.3 C), resp. rate 16, height 5' 8 (1.727 m), weight 70.3 kg, SpO2 97%. Temp:  [98.1 F (36.7 C)-99.1 F (37.3 C)] 99.1 F (37.3 C) (02/04 1547) Pulse Rate:  [80-95] 80 (02/04 1547) Resp:  [16-18] 16 (02/04 1547) BP: (113-117)/(74-79) 113/77 (02/04 1547) SpO2:  [97 %-100 %] 97 % (02/04 1547)  Physical Exam: Physical Exam Constitutional:      Appearance: Normal appearance.  Neurological:     General: No focal deficit present.     Mental Status: She is alert.     Patient being re-positioned in bed by RN staff  CBC:    BMET Recent  Labs    09/24/23 0557 09/26/23 0546  NA 136 137  K 4.4 3.7  CL 106 104  CO2 22 23  GLUCOSE 95 100*  BUN 14 12  CREATININE 0.72 0.68  CALCIUM 8.5* 8.6*     Liver Panel  No results for input(s): PROT, ALBUMIN , AST, ALT, ALKPHOS, BILITOT, BILIDIR, IBILI in the last 72 hours.     Sedimentation Rate Recent Labs    09/26/23 0546  ESRSEDRATE 123*   C-Reactive Protein Recent Labs    09/25/23 0518 09/26/23 0546  CRP 20.5* 16.1*    Micro Results: Recent Results (from the past 720 hours)  Blood culture (routine x 2)     Status: Abnormal   Collection Time: 09/22/23  4:50 PM   Specimen: BLOOD  Result Value Ref Range Status   Specimen Description   Final    BLOOD LEFT ANTECUBITAL Performed at [facility], [address], [place], [place] [address]    Special Requests   Final    BOTTLES DRAWN AEROBIC AND ANAEROBIC Blood Culture adequate volume Performed at [facility], [address] [place], [place] [address]    Culture  Setup Time   Final    GRAM POSITIVE  COCCI IN CLUSTERS IN BOTH AEROBIC AND ANAEROBIC BOTTLES CRITICAL RESULT CALLED TO, READ BACK BY AND VERIFIED WITH: MAYA SHUCK SWAYNE [phone] AT 1134 BY EC Performed at [facility], [address] [place], [place] [address]    Culture STAPHYLOCOCCUS AUREUS (A)  Final   Report Status 09/25/2023 FINAL  Final   Organism ID, Bacteria STAPHYLOCOCCUS AUREUS  Final      Susceptibility   Staphylococcus aureus - MIC*    CIPROFLOXACIN <=0.5 SENSITIVE Sensitive     ERYTHROMYCIN <=0.25 SENSITIVE Sensitive     GENTAMICIN <=0.5 SENSITIVE Sensitive     OXACILLIN 0.5 SENSITIVE Sensitive     TETRACYCLINE <=1 SENSITIVE Sensitive     VANCOMYCIN  <=0.5 SENSITIVE Sensitive     TRIMETH/SULFA <=10 SENSITIVE Sensitive     CLINDAMYCIN <=0.25 SENSITIVE Sensitive     RIFAMPIN <=0.5 SENSITIVE Sensitive     Inducible Clindamycin NEGATIVE Sensitive     LINEZOLID 2 SENSITIVE  Sensitive     * STAPHYLOCOCCUS AUREUS  Blood Culture ID Panel (Reflexed)     Status: Abnormal   Collection Time: 09/22/23  4:50 PM  Result Value Ref Range Status   Enterococcus faecalis NOT DETECTED NOT DETECTED Final   Enterococcus Faecium NOT DETECTED NOT DETECTED Final   Listeria monocytogenes NOT DETECTED NOT DETECTED Final   Staphylococcus species DETECTED (A) NOT DETECTED Final    Comment: CRITICAL RESULT CALLED TO, READ BACK BY AND VERIFIED WITH: PHARMD MARY SWAYNE [id number] AT 1134 BY EC    Staphylococcus aureus (BCID) DETECTED (A) NOT DETECTED Final    Comment: CRITICAL RESULT CALLED TO, READ BACK BY AND VERIFIED WITH: PHARMD MARY SWAYNE [id number] AT 1134 BY EC    Staphylococcus epidermidis NOT DETECTED NOT DETECTED Final   Staphylococcus lugdunensis NOT DETECTED NOT DETECTED Final   Streptococcus species NOT DETECTED NOT DETECTED Final   Streptococcus agalactiae NOT DETECTED NOT DETECTED Final   Streptococcus pneumoniae NOT DETECTED NOT DETECTED Final   Streptococcus pyogenes NOT DETECTED NOT DETECTED Final   A.calcoaceticus-baumannii NOT DETECTED NOT DETECTED Final   Bacteroides fragilis NOT DETECTED NOT DETECTED Final   Enterobacterales NOT DETECTED NOT DETECTED Final   Enterobacter cloacae complex NOT DETECTED NOT DETECTED Final   Escherichia coli NOT DETECTED NOT DETECTED Final   Klebsiella aerogenes NOT DETECTED NOT DETECTED Final   Klebsiella oxytoca NOT DETECTED NOT DETECTED Final   Klebsiella pneumoniae NOT DETECTED NOT DETECTED Final   Proteus species NOT DETECTED NOT DETECTED Final   Salmonella species NOT DETECTED NOT DETECTED Final   Serratia marcescens NOT DETECTED NOT DETECTED Final   Haemophilus influenzae NOT DETECTED NOT DETECTED Final   Neisseria meningitidis NOT DETECTED NOT DETECTED Final   Pseudomonas aeruginosa NOT DETECTED NOT DETECTED Final   Stenotrophomonas maltophilia NOT DETECTED NOT DETECTED Final   Candida albicans NOT DETECTED NOT DETECTED  Final   Candida auris NOT DETECTED NOT DETECTED Final   Candida glabrata NOT DETECTED NOT DETECTED Final   Candida krusei NOT DETECTED NOT DETECTED Final   Candida parapsilosis NOT DETECTED NOT DETECTED Final   Candida tropicalis NOT DETECTED NOT DETECTED Final   Cryptococcus neoformans/gattii NOT DETECTED NOT DETECTED Final   Meth resistant mecA/C and MREJ NOT DETECTED NOT DETECTED Final    Comment: Performed at [facility], [address], [place], [place] [address]  Blood culture (routine x 2)     Status: Abnormal   Collection Time: 09/22/23  5:07 PM   Specimen: BLOOD  Result Value Ref  Range Status   Specimen Description   Final    BLOOD RIGHT ANTECUBITAL Performed at [facility], [address] [place], [place] [address]    Special Requests   Final    BOTTLES DRAWN AEROBIC AND ANAEROBIC Blood Culture adequate volume Performed at [facility], [address] [place], [place] [address]    Culture  Setup Time   Final    GRAM POSITIVE COCCI IN CLUSTERS IN BOTH AEROBIC AND ANAEROBIC BOTTLES CRITICAL VALUE NOTED.  VALUE IS CONSISTENT WITH PREVIOUSLY REPORTED AND CALLED VALUE.    Culture (A)  Final    STAPHYLOCOCCUS AUREUS SUSCEPTIBILITIES PERFORMED ON PREVIOUS CULTURE WITHIN THE LAST 5 DAYS. Performed at [facility], [address], [place], [place] [address]    Report Status 09/25/2023 FINAL  Final  Anaerobic culture w Gram Stain     Status: None (Preliminary result)   Collection Time: 09/23/23 10:19 AM   Specimen: Joint, Right Hip; Synovial Fluid  Result Value Ref Range Status   Specimen Description   Final    SYNOVIAL Performed at [facility], [address] [place], [place] [address]    Special Requests   Final    NONE RIGHT HIP Performed at [facility], [address], [place], [place] [address]    Gram Stain   Final    MODERATE WBC PRESENT,BOTH PMN AND MONONUCLEAR FEW GRAM  POSITIVE COCCI IN CLUSTERS Performed at [facility], [address], [place], [place] [address]    Culture   Final    NO ANAEROBES ISOLATED; CULTURE IN PROGRESS FOR 5 DAYS   Report Status PENDING  Incomplete  Culture, blood (Routine X 2) w Reflex to ID Panel     Status: None (Preliminary result)   Collection Time: 09/24/23  5:57 AM   Specimen: BLOOD RIGHT ARM  Result Value Ref Range Status   Specimen Description   Final    BLOOD RIGHT ARM Performed at [facility], [address], [place], [place] [address]    Special Requests   Final    BOTTLES DRAWN AEROBIC AND ANAEROBIC Blood Culture results may not be optimal due to an inadequate volume of blood received in culture bottles Performed at [facility], [address] [place], [place] [address]    Culture   Final    NO GROWTH 2 DAYS Performed at [facility], [address], [place], [place] [address]    Report Status PENDING  Incomplete  Culture, blood (Routine X 2) w Reflex to ID Panel     Status: None (Preliminary result)   Collection Time: 09/24/23  6:04 AM   Specimen: BLOOD RIGHT ARM  Result Value Ref Range Status   Specimen Description   Final    BLOOD RIGHT ARM Performed at [facility], [address], [place], [place] [address]    Special Requests   Final    BOTTLES DRAWN AEROBIC AND ANAEROBIC Blood Culture adequate volume Performed at [facility], [address] [place], [place] [address]    Culture   Final    NO GROWTH 2 DAYS Performed at [facility], [address] [place], [place] [address]    Report Status PENDING  Incomplete    Studies/Results: No results found.    Assessment/Plan:  INTERVAL HISTORY: repeat blood cultures fortunately no growth so far   Principal Problem:   Septic  arthritis (HCC) Active Problems:   Hypothyroidism (acquired)   Pain   MSSA bacteremia   Left hip pain   Right hip pain   Encounter for  hepatitis C screening test for low risk patient    Melanie Li is a 58 y.o. female with with history of hypothyroidism who underwent corticosteroid injection after a injury while playing pickle ball.   Ultimately after some initial benefit from the injection she had worsening pain which made it difficult for her to even ambulate.   She had workup done for radicular pain with MRI of the spine which was unrevealing and then an MRI of the hip that showed of septic arthritis with a large joint fluid collection as well as destruction of cartilage and bone having been seen.   Was sent to the emergency department by Dr. Fidel due to concerns about her septic hip and desire to have this aspirated and have antibiotics started.  Blood cultures taken on admission have subsequent yielded MSSA aspirate of the joint shows gram-positive cocci in clusters which is undoubtedly the same MSSA she is scheduled for surgery later this week on Wednesday   2D echocardiogram does not show evidence of endocarditis.   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 Professional time spent includes the following activities: Preparing to see the patient (review of tests),  Obtaining and/or reviewing separately obtained history (admission/discharge record), Performing a medically appropriate examination and/or evaluation , Ordering medications/tests/procedures, referring and communicating with other health care professionals, Documenting clinical information in the EMR, Independently interpreting results (not separately reported), Communicating results to the patient/family/caregiver, Counseling and educating the patient/family/caregiver and Care coordination (not separately reported).   Evaluation of the patient requires complex antimicrobial therapy evaluation, counseling , isolation needs to reduce disease transmission and risk assessment and mitigation.    Melanie Li 09/26/2023, 4:06 PM

## 2023-09-26 NOTE — Plan of Care (Signed)

## 2023-09-26 NOTE — Plan of Care (Signed)
  Problem: Education: Goal: Knowledge of General Education information will improve Description: Including pain rating scale, medication(s)/side effects and non-pharmacologic comfort measures Outcome: Progressing   Problem: Health Behavior/Discharge Planning: Goal: Ability to manage health-related needs will improve Outcome: Progressing   Problem: Clinical Measurements: Goal: Ability to maintain clinical measurements within normal limits will improve Outcome: Progressing Goal: Will remain free from infection Outcome: Progressing Goal: Diagnostic test results will improve Outcome: Progressing Goal: Respiratory complications will improve Outcome: Progressing   Problem: Activity: Goal: Risk for activity intolerance will decrease Outcome: Progressing   Problem: Nutrition: Goal: Adequate nutrition will be maintained Outcome: Progressing   Problem: Coping: Goal: Level of anxiety will decrease Outcome: Progressing   Problem: Elimination: Goal: Will not experience complications related to bowel motility Outcome: Progressing Goal: Will not experience complications related to urinary retention Outcome: Progressing   Problem: Skin Integrity: Goal: Risk for impaired skin integrity will decrease Outcome: Progressing

## 2023-09-26 NOTE — Progress Notes (Signed)
 PROGRESS NOTE Melanie Li  FMW:[id number] DOB: [birth year] DOA: 09/22/2023 PCP: Wendolyn Jenkins Jansky, MD  Brief Narrative/Hospital Course: 58 year old with no significant past medical history presented to ER with worsening right hip pain for nearly 1-1/63-month refractory to medications and intra-articular injection, now inability to ambulate. Stat MRI of the hip was obtained at [facility] concerning for septic arthritis. Patient was seen by Dr. Fidel in the office and was recommended ER for admission for right hip septic arthritis. S/p IR guided joint aspiration in 2/1, ID has been consulted, blood culture positive for MSSA     Subjective: Overnight afebrile BP stable  Daughter at the bedside Labs with mild leukocytosis CRP down to 16  Assessment and Plan: Principal Problem:   Septic arthritis (HCC) Active Problems:   Hypothyroidism (acquired)   Pain   MSSA bacteremia   Left hip pain   Right hip pain   Encounter for hepatitis C screening test for low risk patient   Right hip MSSA septic arthritis with destruction of cartilage and bone: Blood cx 1/31 mssa. S/p IR guided joint aspiration on 2/1-purulent fluid was obtained and culture- gram stain- [facility] In clusters-culture in process CRP 22? Down t o16, orthopedic input appreciated continue IV antibiotics as per ID, pain rx  Planning for right hip debridement and placement of antibiotic spacer 1/5.  Repeat blood culture 2/2 no growth to date, hold off on PICC line until 5 days post culture per ID. Hold DVT prophylaxis after Tuesday a.m. dose.   MSSA bacteremia: Blood culture positive for MSSA.  ID following, recheck blood culture 2/2 NGTD.  2D echo 2/2 with EF 60 to 65%, G1 DD small pericardial effusion, mitral valve normal aortic valve tricuspid cannot exclude small PFO.  Continue plan as per ID with antibiotics, hip debridement as above  Hypothyroidism:  Continue home levothyroxine    Acute anemia Likely in the setting of acute  illness septic arthritis - hb holding 8-9 g range, a month ago was 12.6 on admission 9.2. Denies melena.She had colonoscopy more than 10 years ago at [place]  that was negative. Iron panel suggesting iron deficiency.  Continue iron supplements.  Recent Labs    09/23/23 0455 09/23/23 1300 09/23/23 1508 09/24/23 0557 09/25/23 0518 09/26/23 0546  HGB 7.8* 9.2*  --  8.6* 8.2* 8.0*  MCV 94.6  --   --  95.5 93.5 93.8  FERRITIN  --   --  903*  --   --   --   TIBC  --  130*  --   --   --   --   IRON  --  19*  --   --   --   --     DVT prophylaxis: enoxaparin  (LOVENOX ) injection 40 mg Start: 09/24/23 1400 SCDs Start: 09/22/23 1911 Code Status:   Code Status: Full Code Family Communication: plan of care discussed with patient at bedside. Patient status is: Remains hospitalized because of severity of illness Level of care: Telemetry   Dispo: The patient is from: home            Anticipated disposition: TBD Objective: Vitals last 24 hrs: Vitals:   09/25/23 0445 09/25/23 1206 09/25/23 1949 09/26/23 0511  BP: 125/85 119/82 115/74 117/79  Pulse: 78 77 87 95  Resp: 18   18  Temp: 97.6 F (36.4 C) 98.3 F (36.8 C) 98.1 F (36.7 C) 98.2 F (36.8 C)  TempSrc:      SpO2: 98% 99% 100% 98%  Weight:  Height:       Weight change:   Physical Examination: General exam: alert awake, oriented at baseline, older than stated age HEENT:Oral mucosa moist, Ear/Nose WNL grossly Respiratory system: Bilaterally clear BS,no use of accessory muscle Cardiovascular system: S1 & S2 +, No JVD. Gastrointestinal system: Abdomen soft,NT,ND, BS+ Nervous System: Alert, awake, moving all extremities,and following commands. Extremities: LE edema neg,distal peripheral pulses palpable and warm.  Skin: No rashes,no icterus. MSK: Normal muscle bulk,tone, power   Medications reviewed:  Scheduled Meds:  enoxaparin  (LOVENOX ) injection  40 mg Subcutaneous Q24H   Ferrous Fumarate   1 tablet Oral Daily    gabapentin   300 mg Oral QHS   levothyroxine   100 mcg Oral QAC breakfast   senna-docusate  2 tablet Oral BID   sodium chloride  flush  3 mL Intravenous Q12H   Continuous Infusions:   ceFAZolin  (ANCEF ) IV 2 g (09/26/23 9374)    Diet Order             Diet regular Room service appropriate? Yes; Fluid consistency: Thin  Diet effective now                   Intake/Output Summary (Last 24 hours) at 09/26/2023 1201 Last data filed at 09/26/2023 1000 Gross per 24 hour  Intake 360 ml  Output 200 ml  Net 160 ml   Net IO Since Admission: 600 mL [09/26/23 1201]  Wt Readings from Last 3 Encounters:  09/22/23 70.3 kg  08/20/23 70.3 kg  02/10/22 73.9 kg     Unresulted Labs (From admission, onward)     Start     Ordered   09/26/23 0500  CBC  Daily,   R      09/25/23 1251   09/26/23 0500  Basic metabolic panel  Daily,   R      09/25/23 1251   09/26/23 0500  Hepatitis B surface antibody,quantitative  Tomorrow morning,   R        09/25/23 1431   09/26/23 0500  HCV Ab w Reflex to Quant PCR  Tomorrow morning,   R        09/25/23 1431          Data Reviewed: I have personally reviewed following labs and imaging studies CBC: Recent Labs  Lab 09/22/23 1326 09/23/23 0455 09/23/23 1300 09/24/23 0557 09/25/23 0518 09/26/23 0546  WBC 14.0* 10.9*  --  11.8* 11.3* 12.8*  NEUTROABS 10.8*  --   --   --   --   --   HGB 9.3* 7.8* 9.2* 8.6* 8.2* 8.0*  HCT 28.7* 24.3* 28.1* 27.4* 25.8* 25.6*  MCV 90.5 94.6  --  95.5 93.5 93.8  PLT 410* 314  --  383 397 493*   Basic Metabolic Panel:  Recent Labs  Lab 09/22/23 0023 09/22/23 1326 09/23/23 0455 09/24/23 0557 09/26/23 0546  NA  --  136 136 136 137  K  --  3.7 3.4* 4.4 3.7  CL  --  102 104 106 104  CO2  --  21* 23 22 23   GLUCOSE  --  108* 101* 95 100*  BUN  --  13 14 14 12   CREATININE 0.76 0.76 0.75 0.72 0.68  CALCIUM  --  8.8* 8.6* 8.5* 8.6*  MG  --   --  2.0  --   --    GFR: Estimated Creatinine Clearance: 76.4 mL/min (by C-G  formula based on SCr of 0.68 mg/dL). Liver Function Tests:  Recent Labs  Lab 09/22/23 1326  AST 29  ALT 26  ALKPHOS 68  BILITOT 0.9  PROT 7.1  ALBUMIN  2.6*   Recent Results (from the past 240 hours)  Blood culture (routine x 2)     Status: Abnormal   Collection Time: 09/22/23  4:50 PM   Specimen: BLOOD  Result Value Ref Range Status   Specimen Description   Final    BLOOD LEFT ANTECUBITAL Performed at [facility], [address], [place], [place] [address]    Special Requests   Final    BOTTLES DRAWN AEROBIC AND ANAEROBIC Blood Culture adequate volume Performed at [facility], [address] [place], [place] [address]    Culture  Setup Time   Final    GRAM POSITIVE COCCI IN CLUSTERS IN BOTH AEROBIC AND ANAEROBIC BOTTLES CRITICAL RESULT CALLED TO, READ BACK BY AND VERIFIED WITH: MAYA SHUCK SWAYNE [phone] AT 1134 BY EC Performed at [facility], [address] [place], [place] [address]    Culture STAPHYLOCOCCUS AUREUS (A)  Final   Report Status 09/25/2023 FINAL  Final   Organism ID, Bacteria STAPHYLOCOCCUS AUREUS  Final      Susceptibility   Staphylococcus aureus - MIC*    CIPROFLOXACIN <=0.5 SENSITIVE Sensitive     ERYTHROMYCIN <=0.25 SENSITIVE Sensitive     GENTAMICIN <=0.5 SENSITIVE Sensitive     OXACILLIN 0.5 SENSITIVE Sensitive     TETRACYCLINE <=1 SENSITIVE Sensitive     VANCOMYCIN  <=0.5 SENSITIVE Sensitive     TRIMETH/SULFA <=10 SENSITIVE Sensitive     CLINDAMYCIN <=0.25 SENSITIVE Sensitive     RIFAMPIN <=0.5 SENSITIVE Sensitive     Inducible Clindamycin NEGATIVE Sensitive     LINEZOLID 2 SENSITIVE Sensitive     * STAPHYLOCOCCUS AUREUS  Blood Culture ID Panel (Reflexed)     Status: Abnormal   Collection Time: 09/22/23  4:50 PM  Result Value Ref Range Status   Enterococcus faecalis NOT DETECTED NOT DETECTED Final   Enterococcus Faecium NOT DETECTED NOT DETECTED Final   Listeria monocytogenes NOT DETECTED NOT  DETECTED Final   Staphylococcus species DETECTED (A) NOT DETECTED Final    Comment: CRITICAL RESULT CALLED TO, READ BACK BY AND VERIFIED WITH: PHARMD MARY SWAYNE [id number] AT 1134 BY EC    Staphylococcus aureus (BCID) DETECTED (A) NOT DETECTED Final    Comment: CRITICAL RESULT CALLED TO, READ BACK BY AND VERIFIED WITH: PHARMD MARY SWAYNE [phone] AT 1134 BY EC    Staphylococcus epidermidis NOT DETECTED NOT DETECTED Final   Staphylococcus lugdunensis NOT DETECTED NOT DETECTED Final   Streptococcus species NOT DETECTED NOT DETECTED Final   Streptococcus agalactiae NOT DETECTED NOT DETECTED Final   Streptococcus pneumoniae NOT DETECTED NOT DETECTED Final   Streptococcus pyogenes NOT DETECTED NOT DETECTED Final   A.calcoaceticus-baumannii NOT DETECTED NOT DETECTED Final   Bacteroides fragilis NOT DETECTED NOT DETECTED Final   Enterobacterales NOT DETECTED NOT DETECTED Final   Enterobacter cloacae complex NOT DETECTED NOT DETECTED Final   Escherichia coli NOT DETECTED NOT DETECTED Final   Klebsiella aerogenes NOT DETECTED NOT DETECTED Final   Klebsiella oxytoca NOT DETECTED NOT DETECTED Final   Klebsiella pneumoniae NOT DETECTED NOT DETECTED Final   Proteus species NOT DETECTED NOT DETECTED Final   Salmonella species NOT DETECTED NOT DETECTED Final   Serratia marcescens NOT DETECTED NOT DETECTED Final   Haemophilus influenzae NOT DETECTED NOT DETECTED Final   Neisseria meningitidis NOT DETECTED NOT DETECTED Final   Pseudomonas aeruginosa NOT  DETECTED NOT DETECTED Final   Stenotrophomonas maltophilia NOT DETECTED NOT DETECTED Final   Candida albicans NOT DETECTED NOT DETECTED Final   Candida auris NOT DETECTED NOT DETECTED Final   Candida glabrata NOT DETECTED NOT DETECTED Final   Candida krusei NOT DETECTED NOT DETECTED Final   Candida parapsilosis NOT DETECTED NOT DETECTED Final   Candida tropicalis NOT DETECTED NOT DETECTED Final   Cryptococcus neoformans/gattii NOT DETECTED NOT  DETECTED Final   Meth resistant mecA/C and MREJ NOT DETECTED NOT DETECTED Final    Comment: Performed at [facility], [address], [place], [place] [address]  Blood culture (routine x 2)     Status: Abnormal   Collection Time: 09/22/23  5:07 PM   Specimen: BLOOD  Result Value Ref Range Status   Specimen Description   Final    BLOOD RIGHT ANTECUBITAL Performed at [facility], [address], [place], [place] [address]    Special Requests   Final    BOTTLES DRAWN AEROBIC AND ANAEROBIC Blood Culture adequate volume Performed at [facility], [address], [place], [place] [address]    Culture  Setup Time   Final    GRAM POSITIVE COCCI IN CLUSTERS IN BOTH AEROBIC AND ANAEROBIC BOTTLES CRITICAL VALUE NOTED.  VALUE IS CONSISTENT WITH PREVIOUSLY REPORTED AND CALLED VALUE.    Culture (A)  Final    STAPHYLOCOCCUS AUREUS SUSCEPTIBILITIES PERFORMED ON PREVIOUS CULTURE WITHIN THE LAST 5 DAYS. Performed at [facility], [address] [place], [place] [address]    Report Status 09/25/2023 FINAL  Final  Anaerobic culture w Gram Stain     Status: None (Preliminary result)   Collection Time: 09/23/23 10:19 AM   Specimen: Joint, Right Hip; Synovial Fluid  Result Value Ref Range Status   Specimen Description   Final    SYNOVIAL Performed at [facility], [address] [place], [place] [address]    Special Requests   Final    NONE RIGHT HIP Performed at [facility], [address] [place], [place] [address]    Gram Stain   Final    MODERATE WBC PRESENT,BOTH PMN AND MONONUCLEAR FEW GRAM POSITIVE COCCI IN CLUSTERS Performed at [facility], [address] [place], [place] [address]    Culture   Final    NO ANAEROBES ISOLATED; CULTURE IN PROGRESS FOR 5 DAYS   Report Status PENDING  Incomplete  Culture, blood (Routine X 2) w Reflex to ID Panel     Status: None (Preliminary result)    Collection Time: 09/24/23  5:57 AM   Specimen: BLOOD RIGHT ARM  Result Value Ref Range Status   Specimen Description   Final    BLOOD RIGHT ARM Performed at [facility], [address], [place], [place] [address]    Special Requests   Final    BOTTLES DRAWN AEROBIC AND ANAEROBIC Blood Culture results may not be optimal due to an inadequate volume of blood received in culture bottles Performed at [facility], [address] [place], [place] [address]    Culture   Final    NO GROWTH 2 DAYS Performed at [facility], [address] [place], [place] [address]    Report Status PENDING  Incomplete  Culture, blood (Routine X 2) w Reflex to ID Panel     Status: None (Preliminary result)   Collection Time: 09/24/23  6:04 AM  Specimen: BLOOD RIGHT ARM  Result Value Ref Range Status   Specimen Description   Final    BLOOD RIGHT ARM Performed at [facility], [address] [place], [place] [address]    Special Requests   Final    BOTTLES DRAWN AEROBIC AND ANAEROBIC Blood Culture adequate volume Performed at [facility], [address] [place], [place] [address]    Culture   Final    NO GROWTH 2 DAYS Performed at [facility], [address], [place], [place] [address]    Report Status PENDING  Incomplete    Antimicrobials/Microbiology: Anti-infectives (From admission, onward)    Start     Dose/Rate Route Frequency Ordered Stop   09/23/23 1530  ceFAZolin  (ANCEF ) IVPB 2g/100 mL premix        2 g 200 mL/hr over 30 Minutes Intravenous Every 8 hours 09/23/23 1433     09/23/23 1130  cefTRIAXone  (ROCEPHIN ) 2 g in sodium chloride  0.9 % 100 mL IVPB  Status:  Discontinued        2 g 200 mL/hr over 30 Minutes Intravenous Daily 09/23/23 1112 09/23/23 1432   09/23/23 1115  vancomycin  (VANCOREADY) IVPB 1500 mg/300 mL  Status:  Discontinued        1,500 mg 150 mL/hr over 120 Minutes Intravenous  Once 09/23/23 1112 09/23/23 1432          Component Value Date/Time   SDES  09/24/2023 0604    BLOOD RIGHT ARM Performed at [facility], [address], [place], [place] [address]    SPECREQUEST  09/24/2023 0604    BOTTLES DRAWN AEROBIC AND ANAEROBIC Blood Culture adequate volume Performed at [facility], [address] [place], [place] [address]    CULT  09/24/2023 0604    NO GROWTH 2 DAYS Performed at [facility], [address], [place], [place] [address]    REPTSTATUS PENDING 09/24/2023 9395     Radiology Studies: ECHOCARDIOGRAM COMPLETE Result Date: 09/24/2023    ECHOCARDIOGRAM REPORT   Patient Name:   Angelina Theresa [facility] Date of Exam: 09/24/2023 Medical Rec #:  [id number]       Height:       68.0 in Accession #:    [id number]      Weight:       155.0 lb Date of Birth:  01/13/1966        BSA:          1.834 m Patient Age:    57 years        BP:           118/70 mmHg Patient Gender: F               HR:           72 bpm. Exam Location:  Inpatient Procedure: 2D Echo, Color Doppler and Cardiac Doppler Indications:    Bacteremia R78.81  History:        Patient has no prior history of Echocardiogram examinations.  Sonographer:    Tinnie Gosling RDCS Referring Phys: [id number] VELNA SAUNDERS PAHWANI IMPRESSIONS  1. Left ventricular ejection fraction, by estimation, is 60 to 65%. The left ventricle has normal function. The left ventricle has no regional wall motion abnormalities. There is mild left ventricular hypertrophy. Left ventricular diastolic parameters are consistent with Grade I diastolic dysfunction (impaired relaxation).  2. Right ventricular systolic function is low normal. The right ventricular size is normal. There is  normal pulmonary artery systolic pressure. The estimated right ventricular systolic pressure is 17.4 mmHg.  3. A small pericardial effusion is present. The pericardial effusion is localized near the right ventricle.  4. The mitral valve is grossly normal. Trivial mitral valve  regurgitation.  5. The aortic valve is tricuspid. Aortic valve regurgitation is not visualized.  6. The inferior vena cava is normal in size with greater than 50% respiratory variability, suggesting right atrial pressure of 3 mmHg.  7. Cannot exclude a small PFO. Comparison(s): No prior Echocardiogram. Conclusion(s)/Recommendation(s): No evidence of valvular vegetations on this transthoracic echocardiogram. Consider a transesophageal echocardiogram to exclude infective endocarditis if clinically indicated. FINDINGS  Left Ventricle: Left ventricular ejection fraction, by estimation, is 60 to 65%. The left ventricle has normal function. The left ventricle has no regional wall motion abnormalities. The left ventricular internal cavity size was normal in size. There is  mild left ventricular hypertrophy. Left ventricular diastolic parameters are consistent with Grade I diastolic dysfunction (impaired relaxation). Indeterminate filling pressures. Right Ventricle: The right ventricular size is normal. No increase in right ventricular wall thickness. Right ventricular systolic function is low normal. There is normal pulmonary artery systolic pressure. The tricuspid regurgitant velocity is 1.90 m/s,  and with an assumed right atrial pressure of 3 mmHg, the estimated right ventricular systolic pressure is 17.4 mmHg. Left Atrium: Left atrial size was normal in size. Right Atrium: Right atrial size was normal in size. Pericardium: A small pericardial effusion is present. The pericardial effusion is localized near the right ventricle. Mitral Valve: The mitral valve is grossly normal. Trivial mitral valve regurgitation. Tricuspid Valve: The tricuspid valve is grossly normal. Tricuspid valve regurgitation is mild. Aortic Valve: The aortic valve is tricuspid. Aortic valve regurgitation is not visualized. Pulmonic Valve: The pulmonic valve was not well visualized. Pulmonic valve regurgitation is not visualized. Aorta: The aortic  root and ascending aorta are structurally normal, with no evidence of dilitation. Venous: The inferior vena cava is normal in size with greater than 50% respiratory variability, suggesting right atrial pressure of 3 mmHg. IAS/Shunts: The interatrial septum is aneurysmal. Cannot exclude a small PFO.  LEFT VENTRICLE PLAX 2D LVIDd:         4.80 cm Diastology LVIDs:         3.30 cm LV e' medial:    9.57 cm/s LV PW:         1.10 cm LV E/e' medial:  6.5 LV IVS:        1.10 cm LV e' lateral:   10.30 cm/s                        LV E/e' lateral: 6.0  RIGHT VENTRICLE             IVC RV S prime:     10.80 cm/s  IVC diam: 1.60 cm TAPSE (M-mode): 2.3 cm LEFT ATRIUM           Index        RIGHT ATRIUM           Index LA diam:      3.70 cm 2.02 cm/m   RA Area:     16.80 cm LA Vol (A4C): 49.9 ml 27.21 ml/m  RA Volume:   44.90 ml  24.48 ml/m  AORTIC VALVE LVOT Vmax:   72.50 cm/s LVOT Vmean:  50.800 cm/s LVOT VTI:    0.145 m MITRAL VALVE  TRICUSPID VALVE MV Area (PHT): 4.19 cm    TR Peak grad:   14.4 mmHg MV Decel Time: 181 msec    TR Vmax:        190.00 cm/s MV E velocity: 62.10 cm/s MV A velocity: 69.00 cm/s  SHUNTS MV E/A ratio:  0.90        Systemic VTI: 0.14 m Vinie Maxcy MD Electronically signed by Vinie Maxcy MD Signature Date/Time: 09/24/2023/1:41:43 PM    Final     LOS: 4 days   Total time spent in review of labs and imaging, patient evaluation, formulation of plan, documentation and communication with family: 35 minutes  Mennie LAMY, MD  [facility]  09/26/2023, 12:01 PM

## 2023-09-27 ENCOUNTER — Encounter (HOSPITAL_COMMUNITY): Payer: Self-pay | Admitting: Internal Medicine

## 2023-09-27 ENCOUNTER — Inpatient Hospital Stay (HOSPITAL_COMMUNITY): Payer: 59 | Admitting: Anesthesiology

## 2023-09-27 ENCOUNTER — Inpatient Hospital Stay (HOSPITAL_COMMUNITY): Payer: 59

## 2023-09-27 ENCOUNTER — Other Ambulatory Visit: Payer: Self-pay

## 2023-09-27 ENCOUNTER — Encounter (HOSPITAL_COMMUNITY)
Admission: EM | Disposition: A | Discharge status: Home Health | Payer: Self-pay | Source: Ambulatory Visit | Attending: Internal Medicine

## 2023-09-27 DIAGNOSIS — M009 Pyogenic arthritis, unspecified: Secondary | ICD-10-CM | POA: Diagnosis not present

## 2023-09-27 DIAGNOSIS — R7881 Bacteremia: Secondary | ICD-10-CM | POA: Diagnosis not present

## 2023-09-27 DIAGNOSIS — M25551 Pain in right hip: Secondary | ICD-10-CM | POA: Diagnosis not present

## 2023-09-27 DIAGNOSIS — E039 Hypothyroidism, unspecified: Secondary | ICD-10-CM

## 2023-09-27 DIAGNOSIS — M00051 Staphylococcal arthritis, right hip: Secondary | ICD-10-CM | POA: Diagnosis not present

## 2023-09-27 DIAGNOSIS — I4891 Unspecified atrial fibrillation: Secondary | ICD-10-CM | POA: Diagnosis not present

## 2023-09-27 DIAGNOSIS — M25552 Pain in left hip: Secondary | ICD-10-CM | POA: Diagnosis not present

## 2023-09-27 HISTORY — PX: TOTAL HIP ARTHROPLASTY: SHX124

## 2023-09-27 LAB — CBC
HCT: 26.7 % — ABNORMAL LOW (ref 36.0–46.0)
Hemoglobin: 8.6 g/dL — ABNORMAL LOW (ref 12.0–15.0)
MCH: 29.9 pg (ref 26.0–34.0)
MCHC: 32.2 g/dL (ref 30.0–36.0)
MCV: 92.7 fL (ref 80.0–100.0)
Platelets: 451 10*3/uL — ABNORMAL HIGH (ref 150–400)
RBC: 2.88 MIL/uL — ABNORMAL LOW (ref 3.87–5.11)
RDW: 12.5 % (ref 11.5–15.5)
WBC: 11.6 10*3/uL — ABNORMAL HIGH (ref 4.0–10.5)
nRBC: 0 % (ref 0.0–0.2)

## 2023-09-27 LAB — PREPARE RBC (CROSSMATCH)

## 2023-09-27 LAB — BASIC METABOLIC PANEL
Anion gap: 10 (ref 5–15)
BUN: 10 mg/dL (ref 6–20)
CO2: 25 mmol/L (ref 22–32)
Calcium: 8.7 mg/dL — ABNORMAL LOW (ref 8.9–10.3)
Chloride: 101 mmol/L (ref 98–111)
Creatinine, Ser: 0.4 mg/dL — ABNORMAL LOW (ref 0.44–1.00)
GFR, Estimated: >60 mL/min (ref >60)
Glucose, Bld: 105 mg/dL — ABNORMAL HIGH (ref 70–99)
Potassium: 3.8 mmol/L (ref 3.5–5.1)
Sodium: 136 mmol/L (ref 135–145)

## 2023-09-27 LAB — HEPATITIS B SURFACE ANTIBODY, QUANTITATIVE: Hep B S AB Quant (Post): <3.5 m[IU]/mL — ABNORMAL LOW

## 2023-09-27 LAB — HCV INTERPRETATION

## 2023-09-27 LAB — HCV AB W REFLEX TO QUANT PCR: HCV Ab: NONREACTIVE

## 2023-09-27 LAB — ABO/RH: ABO/RH(D): O POS

## 2023-09-27 SURGERY — ARTHROPLASTY, HIP, TOTAL, ANTERIOR APPROACH
Anesthesia: General | Site: Hip | Laterality: Right

## 2023-09-27 MED ORDER — KETAMINE HCL 50 MG/5ML IJ SOSY
PREFILLED_SYRINGE | INTRAMUSCULAR | Status: AC
  Filled 2023-09-27: qty 5

## 2023-09-27 MED ORDER — CEFAZOLIN SODIUM-DEXTROSE 2-4 GM/100ML-% IV SOLN
2.0000 g | INTRAVENOUS | Status: AC
  Administered 2023-09-27: 2 g via INTRAVENOUS

## 2023-09-27 MED ORDER — PROPOFOL 1000 MG/100ML IV EMUL
INTRAVENOUS | Status: AC
  Filled 2023-09-27: qty 100

## 2023-09-27 MED ORDER — MIDAZOLAM HCL 2 MG/2ML IJ SOLN
INTRAMUSCULAR | Status: DC | PRN
  Administered 2023-09-27: 2 mg via INTRAVENOUS

## 2023-09-27 MED ORDER — KETOROLAC TROMETHAMINE 30 MG/ML IJ SOLN
INTRAMUSCULAR | Status: AC
  Filled 2023-09-27: qty 1

## 2023-09-27 MED ORDER — ONDANSETRON HCL 4 MG/2ML IJ SOLN
INTRAMUSCULAR | Status: DC | PRN
  Administered 2023-09-27: 4 mg via INTRAVENOUS

## 2023-09-27 MED ORDER — KETAMINE HCL 10 MG/ML IJ SOLN
INTRAMUSCULAR | Status: DC | PRN
  Administered 2023-09-27: 40 mg via INTRAVENOUS
  Administered 2023-09-27: 10 mg via INTRAVENOUS

## 2023-09-27 MED ORDER — ROCURONIUM BROMIDE 10 MG/ML (PF) SYRINGE
PREFILLED_SYRINGE | INTRAVENOUS | Status: AC
  Filled 2023-09-27: qty 10

## 2023-09-27 MED ORDER — PHENYLEPHRINE HCL-NACL 20-0.9 MG/250ML-% IV SOLN
INTRAVENOUS | Status: DC | PRN
  Administered 2023-09-27: 25 ug/min via INTRAVENOUS

## 2023-09-27 MED ORDER — VANCOMYCIN HCL 1000 MG IV SOLR
INTRAVENOUS | Status: DC | PRN
  Administered 2023-09-27: 1000 mg

## 2023-09-27 MED ORDER — TOBRAMYCIN SULFATE 1.2 G IJ SOLR
INTRAMUSCULAR | Status: AC
  Filled 2023-09-27: qty 4.8

## 2023-09-27 MED ORDER — ROCURONIUM BROMIDE 10 MG/ML (PF) SYRINGE
PREFILLED_SYRINGE | INTRAVENOUS | Status: DC | PRN
  Administered 2023-09-27: 60 mg via INTRAVENOUS
  Administered 2023-09-27: 10 mg via INTRAVENOUS

## 2023-09-27 MED ORDER — PROPOFOL 10 MG/ML IV BOLUS
INTRAVENOUS | Status: DC | PRN
  Administered 2023-09-27: 150 mg via INTRAVENOUS

## 2023-09-27 MED ORDER — PROPOFOL 10 MG/ML IV BOLUS
INTRAVENOUS | Status: AC
  Filled 2023-09-27: qty 20

## 2023-09-27 MED ORDER — BUPIVACAINE-EPINEPHRINE 0.25% -1:200000 IJ SOLN
INTRAMUSCULAR | Status: AC
  Filled 2023-09-27: qty 1

## 2023-09-27 MED ORDER — SODIUM CHLORIDE 0.9 % IR SOLN
Status: DC | PRN
  Administered 2023-09-27 (×3): 3000 mL
  Administered 2023-09-27: 250 mL

## 2023-09-27 MED ORDER — SODIUM CHLORIDE 0.9% IV SOLUTION: Freq: Once | INTRAVENOUS | Status: DC

## 2023-09-27 MED ORDER — POVIDONE-IODINE 10 % EX SWAB: 2.0000 | Freq: Once | CUTANEOUS | Status: DC

## 2023-09-27 MED ORDER — DEXAMETHASONE SODIUM PHOSPHATE 10 MG/ML IJ SOLN
INTRAMUSCULAR | Status: DC | PRN
  Administered 2023-09-27: 5 mg via INTRAVENOUS

## 2023-09-27 MED ORDER — HYDROMORPHONE HCL 1 MG/ML IJ SOLN
INTRAMUSCULAR | Status: DC | PRN
  Administered 2023-09-27 (×2): .4 mg via INTRAVENOUS
  Administered 2023-09-27 (×2): .6 mg via INTRAVENOUS

## 2023-09-27 MED ORDER — FENTANYL CITRATE (PF) 100 MCG/2ML IJ SOLN
INTRAMUSCULAR | Status: AC
  Filled 2023-09-27: qty 2

## 2023-09-27 MED ORDER — DROPERIDOL 2.5 MG/ML IJ SOLN: 0.6250 mg | Freq: Once | INTRAMUSCULAR | Status: DC | PRN

## 2023-09-27 MED ORDER — CHLORHEXIDINE GLUCONATE 0.12 % MT SOLN
15.0000 mL | Freq: Once | OROMUCOSAL | Status: AC
  Administered 2023-09-27: 15 mL via OROMUCOSAL

## 2023-09-27 MED ORDER — LACTATED RINGERS IV SOLN: INTRAVENOUS | Status: DC | PRN

## 2023-09-27 MED ORDER — MIDAZOLAM HCL 2 MG/2ML IJ SOLN
INTRAMUSCULAR | Status: AC
  Filled 2023-09-27: qty 2

## 2023-09-27 MED ORDER — FENTANYL CITRATE PF 50 MCG/ML IJ SOSY
50.0000 ug | PREFILLED_SYRINGE | Freq: Once | INTRAMUSCULAR | Status: AC
  Administered 2023-09-27: 50 ug via INTRAVENOUS
  Filled 2023-09-27: qty 1

## 2023-09-27 MED ORDER — ISOPROPYL ALCOHOL 70 % SOLN
Status: DC | PRN
  Administered 2023-09-27: 1 via TOPICAL

## 2023-09-27 MED ORDER — LIDOCAINE HCL (PF) 2 % IJ SOLN
INTRAMUSCULAR | Status: AC
  Filled 2023-09-27: qty 5

## 2023-09-27 MED ORDER — TRANEXAMIC ACID-NACL 1000-0.7 MG/100ML-% IV SOLN
1000.0000 mg | INTRAVENOUS | Status: DC
  Filled 2023-09-27: qty 100

## 2023-09-27 MED ORDER — TOBRAMYCIN SULFATE 1.2 G IJ SOLR
INTRAMUSCULAR | Status: DC | PRN
  Administered 2023-09-27: 6 g
  Administered 2023-09-27: 2.4 g

## 2023-09-27 MED ORDER — SUGAMMADEX SODIUM 200 MG/2ML IV SOLN
INTRAVENOUS | Status: DC | PRN
  Administered 2023-09-27: 200 mg via INTRAVENOUS

## 2023-09-27 MED ORDER — HYDROMORPHONE HCL 1 MG/ML IJ SOLN
INTRAMUSCULAR | Status: AC
  Administered 2023-09-27: 0.5 mg via INTRAVENOUS
  Filled 2023-09-27: qty 2

## 2023-09-27 MED ORDER — ACETAMINOPHEN 500 MG PO TABS
1000.0000 mg | ORAL_TABLET | Freq: Once | ORAL | Status: AC
  Administered 2023-09-27: 1000 mg via ORAL
  Filled 2023-09-27: qty 2

## 2023-09-27 MED ORDER — PRONTOSAN WOUND IRRIGATION OPTIME
TOPICAL | Status: DC | PRN
  Administered 2023-09-27: 1

## 2023-09-27 MED ORDER — CHLORHEXIDINE GLUCONATE 4 % EX SOLN: 60.0000 mL | Freq: Once | CUTANEOUS | Status: DC

## 2023-09-27 MED ORDER — TOBRAMYCIN SULFATE 1.2 G IJ SOLR
INTRAMUSCULAR | Status: AC
  Filled 2023-09-27: qty 1.2

## 2023-09-27 MED ORDER — HYDROMORPHONE HCL 1 MG/ML IJ SOLN
0.2500 mg | INTRAMUSCULAR | Status: DC | PRN
  Administered 2023-09-27 (×2): 0.5 mg via INTRAVENOUS

## 2023-09-27 MED ORDER — TOBRAMYCIN SULFATE 1.2 G IJ SOLR
INTRAMUSCULAR | Status: AC
  Filled 2023-09-27: qty 2.4

## 2023-09-27 MED ORDER — ALBUMIN HUMAN 5 % IV SOLN: INTRAVENOUS | Status: DC | PRN

## 2023-09-27 MED ORDER — DEXAMETHASONE SODIUM PHOSPHATE 10 MG/ML IJ SOLN
INTRAMUSCULAR | Status: AC
  Filled 2023-09-27: qty 1

## 2023-09-27 MED ORDER — FENTANYL CITRATE (PF) 100 MCG/2ML IJ SOLN
INTRAMUSCULAR | Status: DC | PRN
  Administered 2023-09-27 (×2): 50 ug via INTRAVENOUS

## 2023-09-27 MED ORDER — ONDANSETRON HCL 4 MG/2ML IJ SOLN
INTRAMUSCULAR | Status: AC
  Filled 2023-09-27: qty 2

## 2023-09-27 MED ORDER — LIDOCAINE 2% (20 MG/ML) 5 ML SYRINGE
INTRAMUSCULAR | Status: DC | PRN
  Administered 2023-09-27: 80 mg via INTRAVENOUS

## 2023-09-27 MED ORDER — LACTATED RINGERS IV SOLN: INTRAVENOUS | Status: DC

## 2023-09-27 MED ORDER — VANCOMYCIN HCL 1000 MG IV SOLR
INTRAVENOUS | Status: AC
  Filled 2023-09-27: qty 20

## 2023-09-27 MED ORDER — HYDROMORPHONE HCL 2 MG/ML IJ SOLN
INTRAMUSCULAR | Status: AC
  Filled 2023-09-27: qty 1

## 2023-09-27 SURGICAL SUPPLY — 48 items
BAG COUNTER SPONGE SURGICOUNT (BAG) IMPLANT
BAG ZIPLOCK 12X15 (MISCELLANEOUS) IMPLANT
CEMENT BONE R 1X40 (Cement) IMPLANT
CHLORAPREP W/TINT 26 (MISCELLANEOUS) ×1 IMPLANT
COVER BACK TABLE 60X90IN (DRAPES) IMPLANT
COVER PERINEAL POST (MISCELLANEOUS) ×1 IMPLANT
COVER SURGICAL LIGHT HANDLE (MISCELLANEOUS) ×1 IMPLANT
DERMABOND ADVANCED .7 DNX12 (GAUZE/BANDAGES/DRESSINGS) ×2 IMPLANT
DRAPE IMP U-DRAPE 54X76 (DRAPES) ×1 IMPLANT
DRAPE SHEET LG 3/4 BI-LAMINATE (DRAPES) ×3 IMPLANT
DRAPE STERI IOBAN 125X83 (DRAPES) ×1 IMPLANT
DRAPE U-SHAPE 47X51 STRL (DRAPES) ×1 IMPLANT
DRSG AQUACEL AG ADV 3.5X10 (GAUZE/BANDAGES/DRESSINGS) ×1 IMPLANT
ELECT REM PT RETURN 15FT ADLT (MISCELLANEOUS) ×1 IMPLANT
GAUZE SPONGE 4X4 12PLY STRL (GAUZE/BANDAGES/DRESSINGS) ×1 IMPLANT
GLOVE BIO SURGEON STRL SZ7 (GLOVE) ×1 IMPLANT
GLOVE BIO SURGEON STRL SZ8.5 (GLOVE) ×2 IMPLANT
GLOVE BIOGEL PI IND STRL 7.5 (GLOVE) ×1 IMPLANT
GLOVE BIOGEL PI IND STRL 8.5 (GLOVE) ×1 IMPLANT
GOWN SPEC L3 XXLG W/TWL (GOWN DISPOSABLE) ×1 IMPLANT
GOWN STRL REUS W/ TWL XL LVL3 (GOWN DISPOSABLE) ×1 IMPLANT
HEAD FEM CMT REMEDY SM 46 (Head) IMPLANT
HOLDER FOLEY CATH W/STRAP (MISCELLANEOUS) ×1 IMPLANT
HOOD PEEL AWAY T7 (MISCELLANEOUS) ×3 IMPLANT
KIT STIMULAN RAPID CURE 10CC (Orthopedic Implant) IMPLANT
KIT TURNOVER KIT A (KITS) IMPLANT
MANIFOLD NEPTUNE II (INSTRUMENTS) ×1 IMPLANT
MARKER SKIN DUAL TIP RULER LAB (MISCELLANEOUS) ×1 IMPLANT
NDL SAFETY ECLIPSE 18X1.5 (NEEDLE) ×1 IMPLANT
NDL SPNL 18GX3.5 QUINCKE PK (NEEDLE) ×1 IMPLANT
NEEDLE SPNL 18GX3.5 QUINCKE PK (NEEDLE) ×1 IMPLANT
PACK ANTERIOR HIP CUSTOM (KITS) ×1 IMPLANT
SAW OSC TIP CART 19.5X105X1.3 (SAW) ×1 IMPLANT
SEALER BIPOLAR AQUA 6.0 (INSTRUMENTS) ×1 IMPLANT
SET HNDPC FAN SPRY TIP SCT (DISPOSABLE) ×1 IMPLANT
SOLUTION PRONTOSAN WOUND 350ML (IRRIGATION / IRRIGATOR) ×1 IMPLANT
SPIKE FLUID TRANSFER (MISCELLANEOUS) ×1 IMPLANT
STEM FEM REMEDY SM 227 (Stem) IMPLANT
SUT MNCRL AB 3-0 PS2 18 (SUTURE) ×1 IMPLANT
SUT MON AB 2-0 CT1 36 (SUTURE) ×1 IMPLANT
SUT STRATAFIX 14 PDO 48 VLT (SUTURE) ×1 IMPLANT
SUT STRATAFIX PDO 1 14 VIOLET (SUTURE) ×1
SUT VIC AB 2-0 CT1 TAPERPNT 27 (SUTURE) IMPLANT
SYR 3ML LL SCALE MARK (SYRINGE) ×1 IMPLANT
TOWEL GREEN STERILE FF (TOWEL DISPOSABLE) ×1 IMPLANT
TRAY FOLEY MTR SLVR 16FR STAT (SET/KITS/TRAYS/PACK) IMPLANT
TUBE SUCTION HIGH CAP CLEAR NV (SUCTIONS) ×1 IMPLANT
WATER STERILE IRR 1000ML POUR (IV SOLUTION) ×1 IMPLANT

## 2023-09-27 NOTE — Anesthesia Procedure Notes (Signed)
 Procedure Name: Intubation Date/Time: 09/27/2023 5:01 PM  Performed by: Dartha Meckel, CRNAPre-anesthesia Checklist: Patient identified, Emergency Drugs available, Suction available and Patient being monitored Patient Re-evaluated:Patient Re-evaluated prior to induction Oxygen Delivery Method: Circle system utilized Preoxygenation: Pre-oxygenation with 100% oxygen Induction Type: IV induction Ventilation: Mask ventilation without difficulty Laryngoscope Size: 3 and Glidescope Grade View: Grade I Tube type: Oral Tube size: 7.0 mm Number of attempts: 1 Airway Equipment and Method: Stylet and Oral airway Placement Confirmation: ETT inserted through vocal cords under direct vision, positive ETCO2 and breath sounds checked- equal and bilateral Secured at: 21 cm Tube secured with: Tape Dental Injury: Teeth and Oropharynx as per pre-operative assessment

## 2023-09-27 NOTE — Progress Notes (Signed)
 Subjective:   Patient was much more comfortable today versus yesterday when I rounded on her.  Her daughter was searted at the bedside   Antibiotics:  Anti-infectives (From admission, onward)    Start     Dose/Rate Route Frequency Ordered Stop   09/27/23 1707  tobramycin  (NEBCIN ) powder          As needed 09/27/23 1707     09/27/23 1400  ceFAZolin  (ANCEF ) IVPB 2g/100 mL premix        2 g 200 mL/hr over 30 Minutes Intravenous On call to O.R. 09/27/23 1255 09/27/23 1637   09/23/23 1530  [MAR Hold]  ceFAZolin  (ANCEF ) IVPB 2g/100 mL premix        (MAR Hold since Wed 09/27/2023 at 1322.Hold Reason: Transfer to a Procedural area)   2 g 200 mL/hr over 30 Minutes Intravenous Every 8 hours 09/23/23 1433     09/23/23 1130  cefTRIAXone  (ROCEPHIN ) 2 g in sodium chloride  0.9 % 100 mL IVPB  Status:  Discontinued        2 g 200 mL/hr over 30 Minutes Intravenous Daily 09/23/23 1112 09/23/23 1432   09/23/23 1115  vancomycin  (VANCOREADY) IVPB 1500 mg/300 mL  Status:  Discontinued        1,500 mg 150 mL/hr over 120 Minutes Intravenous  Once 09/23/23 1112 09/23/23 1432       Medications: Scheduled Meds:  sodium chloride    Intravenous Once   chlorhexidine   60 mL Topical Once   [MAR Hold] Ferrous Fumarate   1 tablet Oral Daily   [MAR Hold] gabapentin   300 mg Oral QHS   [MAR Hold] levothyroxine   100 mcg Oral QAC breakfast   povidone-iodine   2 Application Topical Once   povidone-iodine   2 Application Topical Once   [MAR Hold] senna-docusate  2 tablet Oral BID   [MAR Hold] sodium chloride  flush  3 mL Intravenous Q12H   Continuous Infusions:  [MAR Hold]  ceFAZolin  (ANCEF ) IV Stopped (09/27/23 0720)   lactated ringers  10 mL/hr at 09/27/23 1345   tranexamic acid      PRN Meds:.[MAR Hold]  HYDROmorphone  (DILAUDID ) injection, isopropyl alcohol , [MAR Hold] morphine , [MAR Hold] polyethylene glycol, Prontosan Wound Irrigation - Optime, sodium chloride  irrigation,  tobramycin     Objective: Weight change:   Intake/Output Summary (Last 24 hours) at 09/27/2023 1725 Last data filed at 09/27/2023 1710 Gross per 24 hour  Intake 220 ml  Output 600 ml  Net -380 ml   Blood pressure 128/85, pulse 75, temperature 98.3 F (36.8 C), temperature source Oral, resp. rate 18, height 5' 8 (1.727 m), weight 70 kg, SpO2 97%. Temp:  [98.2 F (36.8 C)-98.7 F (37.1 C)] 98.3 F (36.8 C) (02/05 1336) Pulse Rate:  [75-92] 75 (02/05 1336) Resp:  [16-18] 18 (02/05 1336) BP: (111-128)/(74-85) 128/85 (02/05 1336) SpO2:  [96 %-97 %] 97 % (02/05 1336) Weight:  [70 kg] 70 kg (02/05 1336)  Physical Exam: Physical Exam Constitutional:      General: She is not in acute distress.    Appearance: Normal appearance. She is well-developed. She is not diaphoretic.  HENT:     Head: Normocephalic and atraumatic.     Right Ear: External ear normal.     Left Ear: External ear normal.     Mouth/Throat:     Pharynx: No oropharyngeal exudate.  Eyes:     General: No scleral icterus.    Conjunctiva/sclera: Conjunctivae normal.     Pupils: Pupils are equal, round,  and reactive to light.  Cardiovascular:     Rate and Rhythm: Normal rate and regular rhythm.  Pulmonary:     Effort: Pulmonary effort is normal. No respiratory distress.     Breath sounds: Normal breath sounds. No wheezing.  Abdominal:     General: Bowel sounds are normal. There is no distension.     Palpations: Abdomen is soft.     Tenderness: There is no abdominal tenderness.  Musculoskeletal:        General: No tenderness.  Lymphadenopathy:     Cervical: No cervical adenopathy.  Skin:    General: Skin is warm and dry.  Neurological:     General: No focal deficit present.     Mental Status: She is alert and oriented to person, place, and time.     Motor: No abnormal muscle tone.     Coordination: Coordination normal.  Psychiatric:        Mood and Affect: Mood normal.        Behavior: Behavior normal.         Thought Content: Thought content normal.        Judgment: Judgment normal.     Patient being re-positioned in bed by RN staff  CBC:    BMET Recent Labs    09/26/23 0546 09/27/23 0548  NA 137 136  K 3.7 3.8  CL 104 101  CO2 23 25  GLUCOSE 100* 105*  BUN 12 10  CREATININE 0.68 0.40*  CALCIUM 8.6* 8.7*     Liver Panel  No results for input(s): PROT, ALBUMIN , AST, ALT, ALKPHOS, BILITOT, BILIDIR, IBILI in the last 72 hours.     Sedimentation Rate Recent Labs    09/26/23 0546  ESRSEDRATE 123*   C-Reactive Protein Recent Labs    09/25/23 0518 09/26/23 0546  CRP 20.5* 16.1*    Micro Results: Recent Results (from the past 720 hours)  Blood culture (routine x 2)     Status: Abnormal   Collection Time: 09/22/23  4:50 PM   Specimen: BLOOD  Result Value Ref Range Status   Specimen Description   Final    BLOOD LEFT ANTECUBITAL Performed at [facility], [address], [place], [place] [address]    Special Requests   Final    BOTTLES DRAWN AEROBIC AND ANAEROBIC Blood Culture adequate volume Performed at [facility], [address], [place], [place] [address]    Culture  Setup Time   Final    GRAM POSITIVE COCCI IN CLUSTERS IN BOTH AEROBIC AND ANAEROBIC BOTTLES CRITICAL RESULT CALLED TO, READ BACK BY AND VERIFIED WITH: MAYA SHUCK SWAYNE [id number] AT 1134 BY EC Performed at [facility], [address], [place], [place] [address]    Culture STAPHYLOCOCCUS AUREUS (A)  Final   Report Status 09/25/2023 FINAL  Final   Organism ID, Bacteria STAPHYLOCOCCUS AUREUS  Final      Susceptibility   Staphylococcus aureus - MIC*    CIPROFLOXACIN <=0.5 SENSITIVE Sensitive     ERYTHROMYCIN <=0.25 SENSITIVE Sensitive     GENTAMICIN <=0.5 SENSITIVE Sensitive     OXACILLIN 0.5 SENSITIVE Sensitive     TETRACYCLINE <=1 SENSITIVE Sensitive     VANCOMYCIN  <=0.5 SENSITIVE Sensitive     TRIMETH/SULFA <=10 SENSITIVE  Sensitive     CLINDAMYCIN <=0.25 SENSITIVE Sensitive     RIFAMPIN <=0.5 SENSITIVE Sensitive     Inducible Clindamycin NEGATIVE Sensitive     LINEZOLID 2 SENSITIVE Sensitive     *  STAPHYLOCOCCUS AUREUS  Blood Culture ID Panel (Reflexed)     Status: Abnormal   Collection Time: 09/22/23  4:50 PM  Result Value Ref Range Status   Enterococcus faecalis NOT DETECTED NOT DETECTED Final   Enterococcus Faecium NOT DETECTED NOT DETECTED Final   Listeria monocytogenes NOT DETECTED NOT DETECTED Final   Staphylococcus species DETECTED (A) NOT DETECTED Final    Comment: CRITICAL RESULT CALLED TO, READ BACK BY AND VERIFIED WITH: PHARMD MARY SWAYNE [id number] AT 1134 BY EC    Staphylococcus aureus (BCID) DETECTED (A) NOT DETECTED Final    Comment: CRITICAL RESULT CALLED TO, READ BACK BY AND VERIFIED WITH: PHARMD MARY SWAYNE [id number] AT 1134 BY EC    Staphylococcus epidermidis NOT DETECTED NOT DETECTED Final   Staphylococcus lugdunensis NOT DETECTED NOT DETECTED Final   Streptococcus species NOT DETECTED NOT DETECTED Final   Streptococcus agalactiae NOT DETECTED NOT DETECTED Final   Streptococcus pneumoniae NOT DETECTED NOT DETECTED Final   Streptococcus pyogenes NOT DETECTED NOT DETECTED Final   A.calcoaceticus-baumannii NOT DETECTED NOT DETECTED Final   Bacteroides fragilis NOT DETECTED NOT DETECTED Final   Enterobacterales NOT DETECTED NOT DETECTED Final   Enterobacter cloacae complex NOT DETECTED NOT DETECTED Final   Escherichia coli NOT DETECTED NOT DETECTED Final   Klebsiella aerogenes NOT DETECTED NOT DETECTED Final   Klebsiella oxytoca NOT DETECTED NOT DETECTED Final   Klebsiella pneumoniae NOT DETECTED NOT DETECTED Final   Proteus species NOT DETECTED NOT DETECTED Final   Salmonella species NOT DETECTED NOT DETECTED Final   Serratia marcescens NOT DETECTED NOT DETECTED Final   Haemophilus influenzae NOT DETECTED NOT DETECTED Final   Neisseria meningitidis NOT DETECTED NOT DETECTED Final    Pseudomonas aeruginosa NOT DETECTED NOT DETECTED Final   Stenotrophomonas maltophilia NOT DETECTED NOT DETECTED Final   Candida albicans NOT DETECTED NOT DETECTED Final   Candida auris NOT DETECTED NOT DETECTED Final   Candida glabrata NOT DETECTED NOT DETECTED Final   Candida krusei NOT DETECTED NOT DETECTED Final   Candida parapsilosis NOT DETECTED NOT DETECTED Final   Candida tropicalis NOT DETECTED NOT DETECTED Final   Cryptococcus neoformans/gattii NOT DETECTED NOT DETECTED Final   Meth resistant mecA/C and MREJ NOT DETECTED NOT DETECTED Final    Comment: Performed at [facility], [address], [place], [place] [address]  Blood culture (routine x 2)     Status: Abnormal   Collection Time: 09/22/23  5:07 PM   Specimen: BLOOD  Result Value Ref Range Status   Specimen Description   Final    BLOOD RIGHT ANTECUBITAL Performed at [facility], [address], [place], [place] [address]    Special Requests   Final    BOTTLES DRAWN AEROBIC AND ANAEROBIC Blood Culture adequate volume Performed at [facility], [address], [place], [place] [address]    Culture  Setup Time   Final    GRAM POSITIVE COCCI IN CLUSTERS IN BOTH AEROBIC AND ANAEROBIC BOTTLES CRITICAL VALUE NOTED.  VALUE IS CONSISTENT WITH PREVIOUSLY REPORTED AND CALLED VALUE.    Culture (A)  Final    STAPHYLOCOCCUS AUREUS SUSCEPTIBILITIES PERFORMED ON PREVIOUS CULTURE WITHIN THE LAST 5 DAYS. Performed at [facility], [address] [place], [place] [address]    Report Status 09/25/2023 FINAL  Final  Anaerobic culture w Gram Stain     Status: None (Preliminary result)   Collection Time: 09/23/23 10:19 AM   Specimen: Joint, Right Hip; Synovial Fluid  Result Value Ref Range Status   Specimen Description   Final    SYNOVIAL Performed at [facility], [address], [place], [place] [address]    Special Requests   Final    NONE RIGHT  HIP Performed at [facility], [address], [place], [place] [address]    Gram Stain   Final    MODERATE WBC PRESENT,BOTH PMN AND MONONUCLEAR FEW GRAM POSITIVE COCCI IN CLUSTERS Performed at [facility], [address] [place], [place] [address]    Culture   Final    NO ANAEROBES ISOLATED; CULTURE IN PROGRESS FOR 5 DAYS   Report Status PENDING  Incomplete  Culture, blood (Routine X 2) w Reflex to ID Panel     Status: None (Preliminary result)   Collection Time: 09/24/23  5:57 AM   Specimen: BLOOD RIGHT ARM  Result Value Ref Range Status   Specimen Description   Final    BLOOD RIGHT ARM Performed at [facility], [address] [place], [place] [address]    Special Requests   Final    BOTTLES DRAWN AEROBIC AND ANAEROBIC Blood Culture results may not be optimal due to an inadequate volume of blood received in culture bottles Performed at [facility], [address] [place], [place] [address]    Culture   Final    NO GROWTH 3 DAYS Performed at [facility], [address], [place], [place] [address]    Report Status PENDING  Incomplete  Culture, blood (Routine X 2) w Reflex to ID Panel     Status: None (Preliminary result)   Collection Time: 09/24/23  6:04 AM   Specimen: BLOOD RIGHT ARM  Result Value Ref Range Status   Specimen Description   Final    BLOOD RIGHT ARM Performed at [facility], [address] [place], [place] [address]    Special Requests   Final    BOTTLES DRAWN AEROBIC AND ANAEROBIC Blood Culture adequate volume Performed at [facility], [address], [place], [place] [address]    Culture   Final    NO GROWTH 3 DAYS Performed at [facility], [address] [place], [place] [address]    Report Status PENDING  Incomplete    Studies/Results: No results found.    Assessment/Plan:  INTERVAL HISTORY: Patient going to the operating room today  Principal  Problem:   Septic arthritis (HCC) Active Problems:   Hypothyroidism (acquired)   Pain   MSSA bacteremia   Left hip pain   Right hip pain   Encounter for hepatitis C screening test for low risk patient    Melanie Li is a 58 y.o. female with with history of hypothyroidism who underwent corticosteroid injection after a injury while playing pickle ball.   Ultimately after some initial benefit from the injection she had worsening pain which made it difficult for her to even ambulate.   She had workup done for radicular pain with MRI of the spine which was unrevealing and then an MRI of the hip that showed of septic arthritis with a large joint fluid collection as well as destruction of cartilage and bone having been seen.   Was sent to the emergency department by Dr. Fidel due to concerns about her septic hip and desire to have this aspirated and have antibiotics started.  Blood cultures taken on admission have subsequent yielded MSSA aspirate of the  joint shows gram-positive cocci in clusters which is undoubtedly the same MSSA she is scheduled for surgery later this week on Wednesday   2D echocardiogram does not show evidence of endocarditis.   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  Will continue to follow them.  Will also follow-up operative findings and culture data.  Will need to wait until her blood is sterile prior to placement of a PICC line.  Given ongoing to give her a minimum of 6 weeks of parenteral therapy I do not see the need to get a transesophageal echocardiogram to investigate for endocarditis.  I do think that I may give her more protracted oral antibiotics due to concerns about osteomyelitis with this particular infection.    I have personally spent 53 minutes involved in face-to-face and  non-face-to-face activities for this patient on the day of the visit. Professional time spent includes the following activities: Preparing to see the patient (review of tests), Obtaining and/or reviewing separately obtained history (admission/discharge record), Performing a medically appropriate examination and/or evaluation , Ordering medications/tests/procedures, referring and communicating with other health care professionals, Documenting clinical information in the EMR, Independently interpreting results (not separately reported), Communicating results to the patient/family/caregiver, Counseling and educating the patient/family/caregiver and Care coordination (not separately reported).   Evaluation of the patient requires complex antimicrobial therapy evaluation, counseling , isolation needs to reduce disease transmission and risk assessment and mitigation.   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 Professional time spent includes the following activities: Preparing to see the patient (review of tests), Obtaining and/or reviewing separately obtained history (admission/discharge record), Performing a medically appropriate examination and/or evaluation , Ordering medications/tests/procedures, referring and communicating with other health care professionals, Documenting clinical information in  the EMR, Independently interpreting results (not separately reported), Communicating results to the patient/family/caregiver, Counseling and educating the patient/family/caregiver and Care coordination (not separately reported).   Evaluation of the patient requires complex antimicrobial therapy evaluation, counseling , isolation needs to reduce disease transmission and risk assessment and mitigation.    Melanie Li 09/27/2023, 5:25 PM

## 2023-09-27 NOTE — Interval H&P Note (Signed)
 History and Physical Interval Note:  09/27/2023 2:02 PM  Melanie Li  has presented today for surgery, with the diagnosis of NATIVE SEPTIC ARTHRITIS RIGHT HIP.  The various methods of treatment have been discussed with the patient and family. After consideration of risks, benefits and other options for treatment, the patient has consented to  Procedure(s): DEBRIDEMENT AND PLACEMENT OF ARTICULATING ANTIBIOTIC SPACER (Right) as a surgical intervention.  The patient's history has been reviewed, patient examined, no change in status, stable for surgery.  I have reviewed the patient's chart and labs.  Questions were answered to the patient's satisfaction.    The risks, benefits, and alternatives were discussed with the patient. There are risks associated with the surgery including, but not limited to, problems with anesthesia (death), infection, instability (giving out of the joint), dislocation, differences in leg length/angulation/rotation, fracture of bones, loosening or failure of implants, hematoma (blood accumulation) which may require surgical drainage, blood clots, pulmonary embolism, nerve injury (foot drop and lateral thigh numbness), and blood vessel injury. The patient understands these risks and elects to proceed.    Redell PARAS Cathryne Mancebo

## 2023-09-27 NOTE — Discharge Instructions (Signed)
 Dr. Redell Shoals Joint Replacement Specialist [facility] [address] [place], [place] [address] [phone]   POSTOPERATIVE DIRECTIONS    Hip Rehabilitation, Guidelines Following Surgery   WEIGHT BEARING Other:  Touchdown weightbearing on right lower extremity with walker.   The results of a hip operation are greatly improved after range of motion and muscle strengthening exercises. Follow all safety measures which are given to protect your hip. If any of these exercises cause increased pain or swelling in your joint, decrease the amount until you are comfortable again. Then slowly increase the exercises. Call your caregiver if you have problems or questions.   HOME CARE INSTRUCTIONS  Most of the following instructions are designed to prevent the dislocation of your new hip.  Remove items at home which could result in a fall. This includes throw rugs or furniture in walking pathways.  Continue medications as instructed at time of discharge. You may have some home medications which will be placed on hold until you complete the course of blood thinner medication. You may start showering once you are discharged home. Do not remove your dressing. Do not put on socks or shoes without following the instructions of your caregivers.   Sit on chairs with arms. Use the chair arms to help push yourself up when arising.  Arrange for the use of a toilet seat elevator so you are not sitting low.  Walk with walker as instructed.  You may resume a sexual relationship in one month or when given the OK by your caregiver.  Use walker as long as suggested by your caregivers.  Touchdown weightbearing right lower extremity. Avoid periods of inactivity such as sitting longer than an hour when not asleep. This helps prevent blood clots.  You may return to work once you are cleared by designer, industrial/product.  Do not drive a car for 6 weeks or until released by your surgeon.  Do not  drive while taking narcotics.  Wear elastic stockings for two weeks following surgery during the day but you may remove then at night.  Make sure you keep all of your appointments after your operation with all of your doctors and caregivers. You should call the office at the above phone number and make an appointment for approximately two weeks after the date of your surgery. Please pick up a stool softener and laxative for home use as long as you are requiring pain medications. ICE to the affected hip every three hours for 30 minutes at a time and then as needed for pain and swelling. Continue to use ice on the hip for pain and swelling from surgery. You may notice swelling that will progress down to the foot and ankle.  This is normal after surgery.  Elevate the leg when you are not up walking on it.   It is important for you to complete the blood thinner medication as prescribed by your doctor. Continue to use the breathing machine which will help keep your temperature down.  It is common for your temperature to cycle up and down following surgery, especially at night when you are not up moving around and exerting yourself.  The breathing machine keeps your lungs expanded and your temperature down.  RANGE OF MOTION AND STRENGTHENING EXERCISES  These exercises are designed to help you keep full movement of your hip joint. Follow your caregiver's or physical therapist's instructions. Perform all exercises about fifteen times, three times per day or as directed. Exercise both hips,  even if you have had only one joint replacement. These exercises can be done on a training (exercise) mat, on the floor, on a table or on a bed. Use whatever works the best and is most comfortable for you. Use music or television while you are exercising so that the exercises are a pleasant break in your day. This will make your life better with the exercises acting as a break in routine you can look forward to.  Lying on your  back, slowly slide your foot toward your buttocks, raising your knee up off the floor. Then slowly slide your foot back down until your leg is straight again.  Lying on your back spread your legs as far apart as you can without causing discomfort.  Lying on your side, raise your upper leg and foot straight up from the floor as far as is comfortable. Slowly lower the leg and repeat.  Lying on your back, tighten up the muscle in the front of your thigh (quadriceps muscles). You can do this by keeping your leg straight and trying to raise your heel off the floor. This helps strengthen the largest muscle supporting your knee.  Lying on your back, tighten up the muscles of your buttocks both with the legs straight and with the knee bent at a comfortable angle while keeping your heel on the floor.   SKILLED REHAB INSTRUCTIONS: If the patient is transferred to a skilled rehab facility following release from the hospital, a list of the current medications will be sent to the facility for the patient to continue.  When discharged from the skilled rehab facility, please have the facility set up the patient's Home Health Physical Therapy prior to being released. Also, the skilled facility will be responsible for providing the patient with their medications at time of release from the facility to include their pain medication and their blood thinner medication. If the patient is still at the rehab facility at time of the two week follow up appointment, the skilled rehab facility will also need to assist the patient in arranging follow up appointment in our office and any transportation needs.  POST-OPERATIVE OPIOID TAPER INSTRUCTIONS: It is important to wean off of your opioid medication as soon as possible. If you do not need pain medication after your surgery it is ok to stop day one. Opioids include: Codeine, Hydrocodone (Norco, Vicodin), Oxycodone (Percocet, oxycontin ) and hydromorphone  amongst others.  Long term  and even short term use of opiods can cause: Increased pain response Dependence Constipation Depression Respiratory depression And more.  Withdrawal symptoms can include Flu like symptoms Nausea, vomiting And more Techniques to manage these symptoms Hydrate well Eat regular healthy meals Stay active Use relaxation techniques(deep breathing, meditating, yoga) Do Not substitute Alcohol  to help with tapering If you have been on opioids for less than two weeks and do not have pain than it is ok to stop all together.  Plan to wean off of opioids This plan should start within one week post op of your joint replacement. Maintain the same interval or time between taking each dose and first decrease the dose.  Cut the total daily intake of opioids by one tablet each day Next start to increase the time between doses. The last dose that should be eliminated is the evening dose.    MAKE SURE YOU:  Understand these instructions.  Will watch your condition.  Will get help right away if you are not doing well or get worse.   Touchdown weightbearing  on right lower extremity with walker.  Pick up stool softner and laxative for home use following surgery while on pain medications. Do not remove your dressing. The dressing is waterproof--it is OK to take showers. Continue to use ice for pain and swelling after surgery. Do not use any lotions or creams on the incision until instructed by your surgeon. Total Hip Protocol. Take antibiotics as directed.

## 2023-09-27 NOTE — Progress Notes (Signed)
 PROGRESS NOTE Melanie Li  FMW:[id number] DOB: 1966/07/28 DOA: 09/22/2023 PCP: Wendolyn Jenkins Jansky, MD  Brief Narrative/Hospital Course: 58 year old with hypothyroidism who had undergone corticosteroid injection after an injury while playing pickle ball but after initial benefit having worsening pain, difficult to ambulate, had MRI of the spine and MRI of the hip that showed septic arthritis with a large joint fluid collection and sent to ED by Dr Fidel due to concern about her septic hip pain desired to have this aspirated and to have antibiotics started.  She is having right hip pain for nearly 1-1/97-month refractory to medications and intra-articular injection. S/p IR guided joint aspiration in 2/1, ID has been consulted, blood culture positive for MSSA and Gram stain on fluid GPC.     Subjective: Seen and examined this morning daughter at the bedside Past 24 hours afebrile BP stable Waiting for OR today Having hip pain and lower back pain while trying to change the position  Assessment and Plan: Principal Problem:   Septic arthritis (HCC) Active Problems:   Hypothyroidism (acquired)   Pain   MSSA bacteremia   Left hip pain   Right hip pain   Encounter for hepatitis C screening test for low risk patient  Right hip MSSA septic arthritis with destruction of cartilage and bone MSSA bacteremia due to above: Blood cx 1/31 mssa. S/p IR guided joint aspiration on 2/1-purulent fluid was obtained and culture- gram stain- GPC In cluster CRP elevated at 22> Down to 16, ID and orthopedics following> going OR for right hip debridement 2/5. Repeat blood culture 2/2 no growth to date, hold off on PICC line until 5 days post culture and only after source contro TTE 09/24/23-EF 60 to 65%, G1 DD small pericardial effusion, mitral valve normal aortic valve tricuspid cannot exclude small PFO  Hypothyroidism:  Continue home levothyroxine    Acute anemia-likely from acute illness Likely in the  setting of acute illness septic arthritis - hb holding 8-9 g range, a month ago was 12.6 on admission 9.2. Denies melena.She had colonoscopy more than 10 years ago at [place]  that was negative. Iron panel suggesting iron deficiency.  Continue iron supplements.  Recent Labs    09/23/23 1300 09/23/23 1508 09/24/23 0557 09/25/23 0518 09/26/23 0546 09/27/23 0548  HGB 9.2*  --  8.6* 8.2* 8.0* 8.6*  MCV  --   --  95.5 93.5 93.8 92.7  FERRITIN  --  903*  --   --   --   --   TIBC 130*  --   --   --   --   --   IRON 19*  --   --   --   --   --     DVT prophylaxis: SCDs Start: 09/22/23 1911 Code Status:   Code Status: Full Code Family Communication: plan of care discussed with patient at bedside. Patient status is: Remains hospitalized because of severity of illness Level of care: Telemetry   Dispo: The patient is from: home            Anticipated disposition: TBD Objective: Vitals last 24 hrs: Vitals:   09/26/23 0511 09/26/23 1547 09/26/23 1904 09/27/23 0432  BP: 117/79 113/77 111/74 120/77  Pulse: 95 80 83 84  Resp: 18 16 16 18   Temp: 98.2 F (36.8 C) 99.1 F (37.3 C) 98.2 F (36.8 C) 98.6 F (37 C)  TempSrc:      SpO2: 98% 97% 96% 97%  Weight:      Height:  Weight change:   Physical Examination: General exam: alert awake, oriented at baseline, older than stated age HEENT:Oral mucosa moist, Ear/Nose WNL grossly Respiratory system: Bilaterally clear BS,no use of accessory muscle Cardiovascular system: S1 & S2 +, No JVD. Gastrointestinal system: Abdomen soft,NT,ND, BS+ Nervous System: Alert, awake, moving all extremities w/ painful movement of the right hip  Extremities: LE edema neg,distal peripheral pulses palpable and warm.  Skin: No rashes,no icterus. MSK: Normal muscle bulk,tone, power   Medications reviewed:  Scheduled Meds:  Ferrous Fumarate   1 tablet Oral Daily   gabapentin   300 mg Oral QHS   levothyroxine   100 mcg Oral QAC breakfast   senna-docusate  2  tablet Oral BID   sodium chloride  flush  3 mL Intravenous Q12H   Continuous Infusions:   ceFAZolin  (ANCEF ) IV Stopped (09/27/23 0720)    Diet Order             Diet NPO time specified Except for: Sips with Meds  Diet effective now                   Intake/Output Summary (Last 24 hours) at 09/27/2023 1103 Last data filed at 09/26/2023 2144 Gross per 24 hour  Intake 120 ml  Output 200 ml  Net -80 ml   Net IO Since Admission: 520 mL [09/27/23 1103]  Wt Readings from Last 3 Encounters:  09/22/23 70.3 kg  08/20/23 70.3 kg  02/10/22 73.9 kg     Unresulted Labs (From admission, onward)     Start     Ordered   09/26/23 0500  CBC  Daily,   R      09/25/23 1251   09/26/23 0500  Basic metabolic panel  Daily,   R      09/25/23 1251          Data Reviewed: I have personally reviewed following labs and imaging studies CBC: Recent Labs  Lab 09/22/23 1326 09/23/23 0455 09/23/23 1300 09/24/23 0557 09/25/23 0518 09/26/23 0546 09/27/23 0548  WBC 14.0* 10.9*  --  11.8* 11.3* 12.8* 11.6*  NEUTROABS 10.8*  --   --   --   --   --   --   HGB 9.3* 7.8* 9.2* 8.6* 8.2* 8.0* 8.6*  HCT 28.7* 24.3* 28.1* 27.4* 25.8* 25.6* 26.7*  MCV 90.5 94.6  --  95.5 93.5 93.8 92.7  PLT 410* 314  --  383 397 493* 451*   Basic Metabolic Panel:  Recent Labs  Lab 09/22/23 1326 09/23/23 0455 09/24/23 0557 09/26/23 0546 09/27/23 0548  NA 136 136 136 137 136  K 3.7 3.4* 4.4 3.7 3.8  CL 102 104 106 104 101  CO2 21* 23 22 23 25   GLUCOSE 108* 101* 95 100* 105*  BUN 13 14 14 12 10   CREATININE 0.76 0.75 0.72 0.68 0.40*  CALCIUM 8.8* 8.6* 8.5* 8.6* 8.7*  MG  --  2.0  --   --   --    GFR: Estimated Creatinine Clearance: 76.4 mL/min (A) (by C-G formula based on SCr of 0.4 mg/dL (L)). Liver Function Tests:  Recent Labs  Lab 09/22/23 1326  AST 29  ALT 26  ALKPHOS 68  BILITOT 0.9  PROT 7.1  ALBUMIN  2.6*   Recent Results (from the past 240 hours)  Blood culture (routine x 2)     Status:  Abnormal   Collection Time: 09/22/23  4:50 PM   Specimen: BLOOD  Result Value Ref Range Status   Specimen Description  Final    BLOOD LEFT ANTECUBITAL Performed at [facility], [address], [place], [place] [address]    Special Requests   Final    BOTTLES DRAWN AEROBIC AND ANAEROBIC Blood Culture adequate volume Performed at [facility], [address] [place], [place] [address]    Culture  Setup Time   Final    GRAM POSITIVE COCCI IN CLUSTERS IN BOTH AEROBIC AND ANAEROBIC BOTTLES CRITICAL RESULT CALLED TO, READ BACK BY AND VERIFIED WITH: MAYA SHUCK SWAYNE [phone] AT 1134 BY EC Performed at [facility], [address], [place], [place] [address]    Culture STAPHYLOCOCCUS AUREUS (A)  Final   Report Status 09/25/2023 FINAL  Final   Organism ID, Bacteria STAPHYLOCOCCUS AUREUS  Final      Susceptibility   Staphylococcus aureus - MIC*    CIPROFLOXACIN <=0.5 SENSITIVE Sensitive     ERYTHROMYCIN <=0.25 SENSITIVE Sensitive     GENTAMICIN <=0.5 SENSITIVE Sensitive     OXACILLIN 0.5 SENSITIVE Sensitive     TETRACYCLINE <=1 SENSITIVE Sensitive     VANCOMYCIN  <=0.5 SENSITIVE Sensitive     TRIMETH/SULFA <=10 SENSITIVE Sensitive     CLINDAMYCIN <=0.25 SENSITIVE Sensitive     RIFAMPIN <=0.5 SENSITIVE Sensitive     Inducible Clindamycin NEGATIVE Sensitive     LINEZOLID 2 SENSITIVE Sensitive     * STAPHYLOCOCCUS AUREUS  Blood Culture ID Panel (Reflexed)     Status: Abnormal   Collection Time: 09/22/23  4:50 PM  Result Value Ref Range Status   Enterococcus faecalis NOT DETECTED NOT DETECTED Final   Enterococcus Faecium NOT DETECTED NOT DETECTED Final   Listeria monocytogenes NOT DETECTED NOT DETECTED Final   Staphylococcus species DETECTED (A) NOT DETECTED Final    Comment: CRITICAL RESULT CALLED TO, READ BACK BY AND VERIFIED WITH: PHARMD MARY SWAYNE [id number] AT 1134 BY EC    Staphylococcus aureus (BCID) DETECTED (A) NOT DETECTED Final     Comment: CRITICAL RESULT CALLED TO, READ BACK BY AND VERIFIED WITH: PHARMD MARY SWAYNE [id number] AT 1134 BY EC    Staphylococcus epidermidis NOT DETECTED NOT DETECTED Final   Staphylococcus lugdunensis NOT DETECTED NOT DETECTED Final   Streptococcus species NOT DETECTED NOT DETECTED Final   Streptococcus agalactiae NOT DETECTED NOT DETECTED Final   Streptococcus pneumoniae NOT DETECTED NOT DETECTED Final   Streptococcus pyogenes NOT DETECTED NOT DETECTED Final   A.calcoaceticus-baumannii NOT DETECTED NOT DETECTED Final   Bacteroides fragilis NOT DETECTED NOT DETECTED Final   Enterobacterales NOT DETECTED NOT DETECTED Final   Enterobacter cloacae complex NOT DETECTED NOT DETECTED Final   Escherichia coli NOT DETECTED NOT DETECTED Final   Klebsiella aerogenes NOT DETECTED NOT DETECTED Final   Klebsiella oxytoca NOT DETECTED NOT DETECTED Final   Klebsiella pneumoniae NOT DETECTED NOT DETECTED Final   Proteus species NOT DETECTED NOT DETECTED Final   Salmonella species NOT DETECTED NOT DETECTED Final   Serratia marcescens NOT DETECTED NOT DETECTED Final   Haemophilus influenzae NOT DETECTED NOT DETECTED Final   Neisseria meningitidis NOT DETECTED NOT DETECTED Final   Pseudomonas aeruginosa NOT DETECTED NOT DETECTED Final   Stenotrophomonas maltophilia NOT DETECTED NOT DETECTED Final   Candida albicans NOT DETECTED NOT DETECTED Final   Candida auris NOT DETECTED NOT DETECTED Final   Candida glabrata NOT DETECTED NOT DETECTED Final   Candida krusei NOT DETECTED NOT DETECTED Final   Candida parapsilosis NOT DETECTED NOT DETECTED Final   Candida tropicalis NOT DETECTED NOT DETECTED  Final   Cryptococcus neoformans/gattii NOT DETECTED NOT DETECTED Final   Meth resistant mecA/C and MREJ NOT DETECTED NOT DETECTED Final    Comment: Performed at [facility], [address], [place], [place] [address]  Blood culture (routine x 2)     Status: Abnormal   Collection Time: 09/22/23  5:07 PM    Specimen: BLOOD  Result Value Ref Range Status   Specimen Description   Final    BLOOD RIGHT ANTECUBITAL Performed at [facility], [address] [place], [place] [address]    Special Requests   Final    BOTTLES DRAWN AEROBIC AND ANAEROBIC Blood Culture adequate volume Performed at [facility], [address] [place], [place] [address]    Culture  Setup Time   Final    GRAM POSITIVE COCCI IN CLUSTERS IN BOTH AEROBIC AND ANAEROBIC BOTTLES CRITICAL VALUE NOTED.  VALUE IS CONSISTENT WITH PREVIOUSLY REPORTED AND CALLED VALUE.    Culture (A)  Final    STAPHYLOCOCCUS AUREUS SUSCEPTIBILITIES PERFORMED ON PREVIOUS CULTURE WITHIN THE LAST 5 DAYS. Performed at [facility], [address], [place], [place] [address]    Report Status 09/25/2023 FINAL  Final  Anaerobic culture w Gram Stain     Status: None (Preliminary result)   Collection Time: 09/23/23 10:19 AM   Specimen: Joint, Right Hip; Synovial Fluid  Result Value Ref Range Status   Specimen Description   Final    SYNOVIAL Performed at [facility], [address], [place], [place] [address]    Special Requests   Final    NONE RIGHT HIP Performed at [facility], [address], [place], [place] [address]    Gram Stain   Final    MODERATE WBC PRESENT,BOTH PMN AND MONONUCLEAR FEW GRAM POSITIVE COCCI IN CLUSTERS Performed at [facility], [address], [place], [place] [address]    Culture   Final    NO ANAEROBES ISOLATED; CULTURE IN PROGRESS FOR 5 DAYS   Report Status PENDING  Incomplete  Culture, blood (Routine X 2) w Reflex to ID Panel     Status: None (Preliminary result)   Collection Time: 09/24/23  5:57 AM   Specimen: BLOOD RIGHT ARM  Result Value Ref Range Status   Specimen Description   Final    BLOOD RIGHT ARM Performed at [facility], [address], [place], [place] [address]    Special Requests   Final     BOTTLES DRAWN AEROBIC AND ANAEROBIC Blood Culture results may not be optimal due to an inadequate volume of blood received in culture bottles Performed at [facility], [address] [place], [place] [address]    Culture   Final    NO GROWTH 3 DAYS Performed at [facility], [address] [place], [place] [address]    Report Status PENDING  Incomplete  Culture, blood (Routine X 2) w Reflex to ID Panel     Status: None (Preliminary result)   Collection Time: 09/24/23  6:04 AM   Specimen: BLOOD RIGHT ARM  Result Value Ref Range Status   Specimen Description   Final    BLOOD RIGHT ARM Performed at [facility], [address], [place], [place] [address]    Special Requests   Final    BOTTLES DRAWN AEROBIC AND ANAEROBIC Blood Culture adequate volume Performed at [facility], [address]  [place], [place] [address]    Culture   Final    NO GROWTH 3 DAYS Performed at [facility], [address], [place], [place] [address]    Report Status PENDING  Incomplete    Antimicrobials/Microbiology: Anti-infectives (From admission, onward)    Start     Dose/Rate Route Frequency Ordered Stop   09/23/23 1530  ceFAZolin  (ANCEF ) IVPB 2g/100 mL premix        2 g 200 mL/hr over 30 Minutes Intravenous Every 8 hours 09/23/23 1433     09/23/23 1130  cefTRIAXone  (ROCEPHIN ) 2 g in sodium chloride  0.9 % 100 mL IVPB  Status:  Discontinued        2 g 200 mL/hr over 30 Minutes Intravenous Daily 09/23/23 1112 09/23/23 1432   09/23/23 1115  vancomycin  (VANCOREADY) IVPB 1500 mg/300 mL  Status:  Discontinued        1,500 mg 150 mL/hr over 120 Minutes Intravenous  Once 09/23/23 1112 09/23/23 1432         Component Value Date/Time   SDES  09/24/2023 0604    BLOOD RIGHT ARM Performed at [facility], [address], [place], [place] [address]    SPECREQUEST  09/24/2023 0604    BOTTLES DRAWN AEROBIC AND ANAEROBIC Blood Culture adequate  volume Performed at [facility] [address] [place], [place] [address]    CULT  09/24/2023 0604    NO GROWTH 3 DAYS Performed at [facility], [address] [place], [place] [address]    REPTSTATUS PENDING 09/24/2023 [id number]     Radiology Studies: No results found.   LOS: 5 days   Total time spent in review of labs and imaging, patient evaluation, formulation of plan, documentation and communication with family: 35 minutes  Mennie LAMY, MD  [facility]  09/27/2023, 11:03 AM

## 2023-09-27 NOTE — Op Note (Signed)
 OPERATIVE REPORT  SURGEON: Redell Shoals, MD   ASSISTANT: Valery Potters, PA-C.  PREOPERATIVE DIAGNOSIS: Native septic arthritis Right hip.   POSTOPERATIVE DIAGNOSIS: Native septic arthritis Right hip.  PROCEDURE: Right hip girdlestone procedure, anterior approach. Placement of articulating antibiotic spacer.   IMPLANTS: OsteoRemedy modular stem, size small. OsteoRemedy modular femoral head, size 46 mm. Stimulan beads, 20 cc, with 2.4 g tobramycin .   ANESTHESIA:  General  ESTIMATED BLOOD LOSS:-500 mL    ANTIBIOTICS: Receiving scheduled IV antibiotics.  DRAINS: None.  SPECIMENS: Right femoral head for aerobic and anaerobic tissue culture.  COMPLICATIONS: None.   CONDITION: PACU - hemodynamically stable.   BRIEF CLINICAL NOTE: Melanie Li is a 58 y.o. female with a greater than 4 weeks of progressively worsening right hip pain.  She had an outpatient MRI of the right hip showing reactive bipolar periarticular edema, erosive destruction of the femoral head and retroacetabular bone (particularly medially), large complex joint effusion with extension to the ischium, and iliopsoas abscess.  She was admitted to the hospitalist service at [facility] ([address]  Blood culture done on 09/22/2023 was positive for MSSA.  Right hip was aspirated on 09/23/2023, and the culture grew MSSA as well.  Infectious disease consultation was obtained, and she has been on IV Ancef .  We discussed surgical treatment with a two-stage procedure.  She was indicated for resection of her right hip with placement of articulating antibiotic spacer.  We discussed the risk, benefits, and alternatives, and she elected to proceed.  PROCEDURE IN DETAIL: Surgical site was marked by myself in the pre-op holding area. Once inside the operating room, general anesthesia was obtained, and a foley catheter was inserted. The patient was then positioned on the Hana table.  All bony prominences were well padded.  The hip was  prepped and draped in the normal sterile surgical fashion.  A time-out was called verifying side and site of surgery. The patient received IV antibiotics within 60 minutes of beginning the procedure.   Bikini incision was made, and superficial dissection was performed lateral to the ASIS.  There was no superficial abscess or abnormal tissue.  The direct anterior approach to the hip was performed through the Hueter interval.  Lateral femoral circumflex vessels were treated with the Auqumantys. The anterior capsule was exposed and an inverted T capsulotomy was made.  Upon entering the joint, purulent fluid was encountered.  The femoral neck cut was made to the level of the templated cut.  A corkscrew was placed into the head and the head was removed.  The femoral head was found to have severe erosive changes, particularly superiorly. The head was passed to the back table and was measured.  The femoral head was sent for Gram stain, aerobic, and anaerobic tissue culture.  Pubofemoral ligament was released off of the calcar, taking care to stay on bone. Superior capsule was released from the greater trochanter, taking care to stay lateral to the posterior border of the femoral neck in order to preserve the short external rotators.   Acetabular exposure was achieved, and the pulvinar and labrum were excised.  There were severe erosive changes to the acetabulum.  The cartilage of the entire acetabulum was completely delaminated from the underlying bone.  There was abscess involving the retroacetabular bone medially.  Sequential reaming of the acetabulum was then performed up to a size 47 mm reamer.  Further involved bony areas were debrided with a curette.  There was an abscess cavity that tracked to the ischium.  I excisionally debrided all devitalized tissue.   I then gained femoral exposure taking care to protect the abductors and greater trochanter.  This was performed using standard external rotation, extension,  and adduction.  Iliopsoas abscess was identified, and all devitalized tissue was excisionally debrided.  A cookie cutter was used to enter the femoral canal, and then the femoral canal finder was placed.  Sequential broaching was performed up to a size small.  Calcar planer was used on the femoral neck remnant.  Once I was satisfied with the debridement, the hip was irrigated with 9 L of normal saline using pulsatile lavage.  Gloves and instruments were changed.  I placed the size small femoral trial and the modular 46 mm trial head ball.  The hip was reduced.  Leg lengths and offset were checked fluoroscopically.  The hip was dislocated and trial components were removed.  On the back table, 1 pack of cement was mixed with 2.4 g tobramycin  and 1 g vancomycin .  The modular articulating spacer was assembled according to manufacturer's instructions.  The final implants were loosely cemented into place.  Cement was carried up along the modular junction.  Excess cement was cleared.  Once the cement was fully hardened, the hip was reduced.  Fluoroscopy was used to confirm component position and leg lengths.  At 90 degrees of external rotation and full extension, the hip was stable to an anterior directed force.   The wound was copiously irrigated with Prontosan solution and normal saline using pulse lavage.  20 cc of Stimulan beads containing tobramycin  were placed into the iliopsoas and ischial areas. The wound was closed in layers using #1 strata fix for the fascia, 2-0 Vicryl for the subcutaneous fat, 2-0 Monocryl for the deep dermal layer, and staples plus Dermabond for the skin.  Once the glue was fully dried, an Aquacell Ag dressing was applied.  The patient was transported to the recovery room in stable condition.  Sponge, needle, and instrument counts were correct at the end of the case x2.  The patient tolerated the procedure well and there were no known complications.  Please note that a surgical assistant  was a medical necessity for this procedure to perform it in a safe and expeditious manner. Assistant was necessary to provide appropriate retraction of vital neurovascular structures, to prevent femoral fracture, and to allow for anatomic placement of the prosthesis.

## 2023-09-27 NOTE — Transfer of Care (Signed)
 Immediate Anesthesia Transfer of Care Note  Patient: Melanie Li  Procedure(s) Performed: DEBRIDEMENT AND PLACEMENT OF ARTICULATING ANTIBIOTIC SPACER (Right: Hip)  Patient Location: PACU  Anesthesia Type:General  Level of Consciousness: awake and patient cooperative  Airway & Oxygen Therapy: Patient Spontanous Breathing and Patient connected to face mask  Post-op Assessment: Report given to RN and Post -op Vital signs reviewed and stable  Post vital signs: Reviewed and stable  Last Vitals:  Vitals Value Taken Time  BP 135/91 09/27/23 1852  Temp 37 C 09/27/23 1852  Pulse 98 09/27/23 1855  Resp 15 09/27/23 1855  SpO2 100 % 09/27/23 1855  Vitals shown include unfiled device data.  Last Pain:  Vitals:   09/27/23 1336  TempSrc: Oral  PainSc: 2       Patients Stated Pain Goal: 0 (09/26/23 2142)  Complications: No notable events documented.

## 2023-09-27 NOTE — Anesthesia Postprocedure Evaluation (Signed)
 Anesthesia Post Note  Patient: Melanie Li  Procedure(s) Performed: DEBRIDEMENT AND PLACEMENT OF ARTICULATING ANTIBIOTIC SPACER (Right: Hip)     Patient location during evaluation: PACU Anesthesia Type: General Level of consciousness: awake and alert Pain management: pain level controlled Vital Signs Assessment: post-procedure vital signs reviewed and stable Respiratory status: spontaneous breathing, nonlabored ventilation and respiratory function stable Cardiovascular status: blood pressure returned to baseline and stable Postop Assessment: no apparent nausea or vomiting Anesthetic complications: no   No notable events documented.  Last Vitals:  Vitals:   09/27/23 1336 09/27/23 1852  BP: 128/85 (!) 135/91  Pulse: 75   Resp: 18 12  Temp: 36.8 C 37 C  SpO2: 97% 94%    Last Pain:  Vitals:   09/27/23 1852  TempSrc:   PainSc: Asleep                 Butler Levander Pinal

## 2023-09-27 NOTE — Anesthesia Preprocedure Evaluation (Addendum)
 Anesthesia Evaluation  Patient identified by MRN, date of birth, ID band Patient awake    Reviewed: Allergy & Precautions, NPO status , Patient's Chart, lab work & pertinent test results  History of Anesthesia Complications Negative for: history of anesthetic complications  Airway Mallampati: II  TM Distance: >3 FB Neck ROM: Full    Dental no notable dental hx.    Pulmonary neg pulmonary ROS   Pulmonary exam normal        Cardiovascular negative cardio ROS Normal cardiovascular exam     Neuro/Psych negative neurological ROS     GI/Hepatic negative GI ROS, Neg liver ROS,,,  Endo/Other  Hypothyroidism    Renal/GU negative Renal ROS  negative genitourinary   Musculoskeletal  (+) Arthritis ,  narcotic dependentSeptic arthritis right hip   Abdominal   Peds  Hematology  (+) Blood dyscrasia (Hgb 8.6), anemia   Anesthesia Other Findings Day of surgery medications reviewed with patient.  Reproductive/Obstetrics                             Anesthesia Physical Anesthesia Plan  ASA: 2  Anesthesia Plan: General   Post-op Pain Management: Tylenol  PO (pre-op)*   Induction: Intravenous  PONV Risk Score and Plan: 3 and Ondansetron , Dexamethasone , Treatment may vary due to age or medical condition and Midazolam   Airway Management Planned: Oral ETT  Additional Equipment: None  Intra-op Plan:   Post-operative Plan: Extubation in OR  Informed Consent: I have reviewed the patients History and Physical, chart, labs and discussed the procedure including the risks, benefits and alternatives for the proposed anesthesia with the patient or authorized representative who has indicated his/her understanding and acceptance.     Dental advisory given  Plan Discussed with: CRNA  Anesthesia Plan Comments:         Anesthesia Quick Evaluation

## 2023-09-28 DIAGNOSIS — M86251 Subacute osteomyelitis, right femur: Secondary | ICD-10-CM

## 2023-09-28 DIAGNOSIS — M25551 Pain in right hip: Secondary | ICD-10-CM | POA: Diagnosis not present

## 2023-09-28 DIAGNOSIS — M86151 Other acute osteomyelitis, right femur: Secondary | ICD-10-CM

## 2023-09-28 DIAGNOSIS — R7881 Bacteremia: Secondary | ICD-10-CM | POA: Diagnosis not present

## 2023-09-28 DIAGNOSIS — M869 Osteomyelitis, unspecified: Secondary | ICD-10-CM | POA: Diagnosis present

## 2023-09-28 DIAGNOSIS — M00051 Staphylococcal arthritis, right hip: Secondary | ICD-10-CM | POA: Diagnosis not present

## 2023-09-28 LAB — CBC
HCT: 22.4 % — ABNORMAL LOW (ref 36.0–46.0)
Hemoglobin: 7 g/dL — ABNORMAL LOW (ref 12.0–15.0)
MCH: 29.2 pg (ref 26.0–34.0)
MCHC: 31.3 g/dL (ref 30.0–36.0)
MCV: 93.3 fL (ref 80.0–100.0)
Platelets: 472 10*3/uL — ABNORMAL HIGH (ref 150–400)
RBC: 2.4 MIL/uL — ABNORMAL LOW (ref 3.87–5.11)
RDW: 12.6 % (ref 11.5–15.5)
WBC: 17 10*3/uL — ABNORMAL HIGH (ref 4.0–10.5)
nRBC: 0 % (ref 0.0–0.2)

## 2023-09-28 LAB — BASIC METABOLIC PANEL
Anion gap: 10 (ref 5–15)
BUN: 13 mg/dL (ref 6–20)
CO2: 26 mmol/L (ref 22–32)
Calcium: 8.3 mg/dL — ABNORMAL LOW (ref 8.9–10.3)
Chloride: 96 mmol/L — ABNORMAL LOW (ref 98–111)
Creatinine, Ser: 0.77 mg/dL (ref 0.44–1.00)
GFR, Estimated: >60 mL/min (ref >60)
Glucose, Bld: 108 mg/dL — ABNORMAL HIGH (ref 70–99)
Potassium: 3.8 mmol/L (ref 3.5–5.1)
Sodium: 132 mmol/L — ABNORMAL LOW (ref 135–145)

## 2023-09-28 LAB — POCT I-STAT EG7
Acid-Base Excess: 0 mmol/L (ref 0.0–2.0)
Bicarbonate: 25.8 mmol/L (ref 20.0–28.0)
Calcium, Ion: 1.19 mmol/L (ref 1.15–1.40)
HCT: 23 % — ABNORMAL LOW (ref 36.0–46.0)
Hemoglobin: 7.8 g/dL — ABNORMAL LOW (ref 12.0–15.0)
O2 Saturation: 77 %
Potassium: 4 mmol/L (ref 3.5–5.1)
Sodium: 136 mmol/L (ref 135–145)
TCO2: 27 mmol/L (ref 22–32)
pCO2, Ven: 44.9 mm[Hg] (ref 44–60)
pH, Ven: 7.367 (ref 7.25–7.43)
pO2, Ven: 44 mm[Hg] (ref 32–45)

## 2023-09-28 LAB — PREPARE RBC (CROSSMATCH)

## 2023-09-28 LAB — ANAEROBIC CULTURE W GRAM STAIN

## 2023-09-28 MED ORDER — METOCLOPRAMIDE HCL 5 MG/ML IJ SOLN: 5.0000 mg | Freq: Three times a day (TID) | INTRAMUSCULAR | Status: DC | PRN

## 2023-09-28 MED ORDER — ASPIRIN 81 MG PO CHEW
81.0000 mg | CHEWABLE_TABLET | Freq: Two times a day (BID) | ORAL | Status: DC
  Administered 2023-09-28 – 2023-10-02 (×9): 81 mg via ORAL
  Filled 2023-09-28 (×10): qty 1

## 2023-09-28 MED ORDER — MENTHOL 3 MG MT LOZG: 1.0000 | LOZENGE | OROMUCOSAL | Status: DC | PRN

## 2023-09-28 MED ORDER — PHENOL 1.4 % MT LIQD: 1.0000 | OROMUCOSAL | Status: DC | PRN

## 2023-09-28 MED ORDER — BISACODYL 10 MG RE SUPP: 10.0000 mg | Freq: Every day | RECTAL | Status: DC | PRN

## 2023-09-28 MED ORDER — METOCLOPRAMIDE HCL 10 MG PO TABS: 5.0000 mg | ORAL_TABLET | Freq: Three times a day (TID) | ORAL | Status: DC | PRN

## 2023-09-28 MED ORDER — ONDANSETRON HCL 4 MG/2ML IJ SOLN: 4.0000 mg | Freq: Four times a day (QID) | INTRAMUSCULAR | Status: DC | PRN

## 2023-09-28 MED ORDER — DOCUSATE SODIUM 100 MG PO CAPS
100.0000 mg | ORAL_CAPSULE | Freq: Two times a day (BID) | ORAL | Status: DC
  Administered 2023-09-28 – 2023-10-02 (×8): 100 mg via ORAL
  Filled 2023-09-28 (×8): qty 1

## 2023-09-28 MED ORDER — DIPHENHYDRAMINE HCL 12.5 MG/5ML PO ELIX: 12.5000 mg | ORAL_SOLUTION | ORAL | Status: DC | PRN

## 2023-09-28 MED ORDER — METHOCARBAMOL 1000 MG/10ML IJ SOLN: 500.0000 mg | Freq: Four times a day (QID) | INTRAMUSCULAR | Status: DC | PRN

## 2023-09-28 MED ORDER — ONDANSETRON HCL 4 MG PO TABS: 4.0000 mg | ORAL_TABLET | Freq: Four times a day (QID) | ORAL | Status: DC | PRN

## 2023-09-28 MED ORDER — ACETAMINOPHEN 325 MG PO TABS
ORAL_TABLET | ORAL | Status: AC
  Administered 2023-09-28: 650 mg via ORAL
  Filled 2023-09-28: qty 1

## 2023-09-28 MED ORDER — SODIUM CHLORIDE 0.9 % IV SOLN
INTRAVENOUS | Status: DC
Start: 2023-09-28 — End: 2023-09-28

## 2023-09-28 MED ORDER — SODIUM CHLORIDE 0.9% IV SOLUTION
Freq: Once | INTRAVENOUS | Status: AC
Start: 2023-09-28 — End: 2023-09-28

## 2023-09-28 MED ORDER — ACETAMINOPHEN 325 MG PO TABS
650.0000 mg | ORAL_TABLET | Freq: Four times a day (QID) | ORAL | Status: DC | PRN
  Administered 2023-09-29: 650 mg via ORAL
  Filled 2023-09-28: qty 2

## 2023-09-28 MED ORDER — METHOCARBAMOL 500 MG PO TABS
500.0000 mg | ORAL_TABLET | Freq: Four times a day (QID) | ORAL | Status: DC | PRN
  Administered 2023-09-28 – 2023-10-01 (×3): 500 mg via ORAL
  Filled 2023-09-28 (×3): qty 1

## 2023-09-28 NOTE — Plan of Care (Signed)
  Problem: Education: Goal: Knowledge of General Education information will improve Description: Including pain rating scale, medication(s)/side effects and non-pharmacologic comfort measures Outcome: Progressing   Problem: Clinical Measurements: Goal: Will remain free from infection Outcome: Progressing Goal: Diagnostic test results will improve Outcome: Progressing   Problem: Pain Managment: Goal: General experience of comfort will improve and/or be controlled Outcome: Progressing

## 2023-09-28 NOTE — Progress Notes (Signed)
 Subjective:   No new complaints she was relieved to have had surgery  Antibiotics:  Anti-infectives (From admission, onward)    Start     Dose/Rate Route Frequency Ordered Stop   09/27/23 1819  vancomycin  (VANCOCIN ) powder  Status:  Discontinued          As needed 09/27/23 1820 09/27/23 1957   09/27/23 1707  tobramycin  (NEBCIN ) powder  Status:  Discontinued          As needed 09/27/23 1707 09/27/23 1957   09/27/23 1400  ceFAZolin  (ANCEF ) IVPB 2g/100 mL premix        2 g 200 mL/hr over 30 Minutes Intravenous On call to O.R. 09/27/23 1255 09/27/23 1637   09/23/23 1530  ceFAZolin  (ANCEF ) IVPB 2g/100 mL premix        2 g 200 mL/hr over 30 Minutes Intravenous Every 8 hours 09/23/23 1433     09/23/23 1130  cefTRIAXone  (ROCEPHIN ) 2 g in sodium chloride  0.9 % 100 mL IVPB  Status:  Discontinued        2 g 200 mL/hr over 30 Minutes Intravenous Daily 09/23/23 1112 09/23/23 1432   09/23/23 1115  vancomycin  (VANCOREADY) IVPB 1500 mg/300 mL  Status:  Discontinued        1,500 mg 150 mL/hr over 120 Minutes Intravenous  Once 09/23/23 1112 09/23/23 1432       Medications: Scheduled Meds:  aspirin   81 mg Oral BID   docusate sodium   100 mg Oral BID   Ferrous Fumarate   1 tablet Oral Daily   gabapentin   300 mg Oral QHS   levothyroxine   100 mcg Oral QAC breakfast   senna-docusate  2 tablet Oral BID   sodium chloride  flush  3 mL Intravenous Q12H   Continuous Infusions:  sodium chloride  75 mL/hr at 09/28/23 1014    ceFAZolin  (ANCEF ) IV 2 g (09/28/23 1334)   PRN Meds:.bisacodyl , diphenhydrAMINE , HYDROmorphone  (DILAUDID ) injection, menthol -cetylpyridinium **OR** phenol, methocarbamol  **OR** methocarbamol  (ROBAXIN ) injection, metoCLOPramide  **OR** metoCLOPramide  (REGLAN ) injection, morphine , ondansetron  **OR** ondansetron  (ZOFRAN ) IV, polyethylene glycol    Objective: Weight change:   Intake/Output Summary (Last 24 hours) at 09/28/2023 1442 Last data filed at 09/27/2023 1807 Gross  per 24 hour  Intake 1350 ml  Output 700 ml  Net 650 ml   Blood pressure 116/77, pulse 98, temperature 98 F (36.7 C), temperature source Oral, resp. rate 18, height 5' 8 (1.727 m), weight 70 kg, SpO2 (!) 88%. Temp:  [98 F (36.7 C)-98.6 F (37 C)] 98 F (36.7 C) (02/06 1332) Pulse Rate:  [78-98] 98 (02/06 1332) Resp:  [9-18] 18 (02/06 1332) BP: (113-138)/(77-93) 116/77 (02/06 1332) SpO2:  [88 %-100 %] 88 % (02/06 1332)  Physical Exam: Physical Exam Constitutional:      General: She is not in acute distress.    Appearance: She is well-developed. She is not diaphoretic.  HENT:     Head: Normocephalic and atraumatic.     Right Ear: External ear normal.     Left Ear: External ear normal.     Mouth/Throat:     Pharynx: No oropharyngeal exudate.  Eyes:     General: No scleral icterus.    Conjunctiva/sclera: Conjunctivae normal.     Pupils: Pupils are equal, round, and reactive to light.  Cardiovascular:     Rate and Rhythm: Normal rate and regular rhythm.  Pulmonary:     Effort: Pulmonary effort is normal. No respiratory distress.     Breath sounds:  No wheezing.  Abdominal:     General: There is no distension.     Palpations: Abdomen is soft.  Lymphadenopathy:     Cervical: No cervical adenopathy.  Skin:    General: Skin is warm and dry.     Coloration: Skin is not pale.     Findings: No erythema or rash.  Neurological:     General: No focal deficit present.     Mental Status: She is alert and oriented to person, place, and time.     Motor: No abnormal muscle tone.     Coordination: Coordination normal.  Psychiatric:        Mood and Affect: Mood normal.        Behavior: Behavior normal.        Thought Content: Thought content normal.        Judgment: Judgment normal.     Right side bandaged  Patient being re-positioned in bed by RN staff  CBC:    BMET Recent Labs    09/27/23 0548 09/27/23 1801 09/28/23 0616  NA 136 136 132*  K 3.8 4.0 3.8  CL 101   --  96*  CO2 25  --  26  GLUCOSE 105*  --  108*  BUN 10  --  13  CREATININE 0.40*  --  0.77  CALCIUM 8.7*  --  8.3*     Liver Panel  No results for input(s): PROT, ALBUMIN , AST, ALT, ALKPHOS, BILITOT, BILIDIR, IBILI in the last 72 hours.     Sedimentation Rate Recent Labs    09/26/23 0546  ESRSEDRATE 123*   C-Reactive Protein Recent Labs    09/26/23 0546  CRP 16.1*    Micro Results: Recent Results (from the past 720 hours)  Blood culture (routine x 2)     Status: Abnormal   Collection Time: 09/22/23  4:50 PM   Specimen: BLOOD  Result Value Ref Range Status   Specimen Description   Final    BLOOD LEFT ANTECUBITAL Performed at [facility], [address], [place], [place] [address]    Special Requests   Final    BOTTLES DRAWN AEROBIC AND ANAEROBIC Blood Culture adequate volume Performed at [facility], [address] [place], [place] [address]    Culture  Setup Time   Final    GRAM POSITIVE COCCI IN CLUSTERS IN BOTH AEROBIC AND ANAEROBIC BOTTLES CRITICAL RESULT CALLED TO, READ BACK BY AND VERIFIED WITH: MAYA SHUCK SWAYNE [phone] AT 1134 BY EC Performed at [facility], [address], [place], [place] [address]    Culture STAPHYLOCOCCUS AUREUS (A)  Final   Report Status 09/25/2023 FINAL  Final   Organism ID, Bacteria STAPHYLOCOCCUS AUREUS  Final      Susceptibility   Staphylococcus aureus - MIC*    CIPROFLOXACIN <=0.5 SENSITIVE Sensitive     ERYTHROMYCIN <=0.25 SENSITIVE Sensitive     GENTAMICIN <=0.5 SENSITIVE Sensitive     OXACILLIN 0.5 SENSITIVE Sensitive     TETRACYCLINE <=1 SENSITIVE Sensitive     VANCOMYCIN  <=0.5 SENSITIVE Sensitive     TRIMETH/SULFA <=10 SENSITIVE Sensitive     CLINDAMYCIN <=0.25 SENSITIVE Sensitive     RIFAMPIN <=0.5 SENSITIVE Sensitive     Inducible Clindamycin NEGATIVE Sensitive     LINEZOLID 2 SENSITIVE Sensitive     * STAPHYLOCOCCUS AUREUS  Blood Culture ID  Panel (Reflexed)     Status: Abnormal   Collection Time: 09/22/23  4:50 PM  Result  Value Ref Range Status   Enterococcus faecalis NOT DETECTED NOT DETECTED Final   Enterococcus Faecium NOT DETECTED NOT DETECTED Final   Listeria monocytogenes NOT DETECTED NOT DETECTED Final   Staphylococcus species DETECTED (A) NOT DETECTED Final    Comment: CRITICAL RESULT CALLED TO, READ BACK BY AND VERIFIED WITH: PHARMD MARY SWAYNE [phone] AT 1134 BY EC    Staphylococcus aureus (BCID) DETECTED (A) NOT DETECTED Final    Comment: CRITICAL RESULT CALLED TO, READ BACK BY AND VERIFIED WITH: PHARMD MARY SWAYNE [phone] AT 1134 BY EC    Staphylococcus epidermidis NOT DETECTED NOT DETECTED Final   Staphylococcus lugdunensis NOT DETECTED NOT DETECTED Final   Streptococcus species NOT DETECTED NOT DETECTED Final   Streptococcus agalactiae NOT DETECTED NOT DETECTED Final   Streptococcus pneumoniae NOT DETECTED NOT DETECTED Final   Streptococcus pyogenes NOT DETECTED NOT DETECTED Final   A.calcoaceticus-baumannii NOT DETECTED NOT DETECTED Final   Bacteroides fragilis NOT DETECTED NOT DETECTED Final   Enterobacterales NOT DETECTED NOT DETECTED Final   Enterobacter cloacae complex NOT DETECTED NOT DETECTED Final   Escherichia coli NOT DETECTED NOT DETECTED Final   Klebsiella aerogenes NOT DETECTED NOT DETECTED Final   Klebsiella oxytoca NOT DETECTED NOT DETECTED Final   Klebsiella pneumoniae NOT DETECTED NOT DETECTED Final   Proteus species NOT DETECTED NOT DETECTED Final   Salmonella species NOT DETECTED NOT DETECTED Final   Serratia marcescens NOT DETECTED NOT DETECTED Final   Haemophilus influenzae NOT DETECTED NOT DETECTED Final   Neisseria meningitidis NOT DETECTED NOT DETECTED Final   Pseudomonas aeruginosa NOT DETECTED NOT DETECTED Final   Stenotrophomonas maltophilia NOT DETECTED NOT DETECTED Final   Candida albicans NOT DETECTED NOT DETECTED Final   Candida auris NOT DETECTED NOT DETECTED Final    Candida glabrata NOT DETECTED NOT DETECTED Final   Candida krusei NOT DETECTED NOT DETECTED Final   Candida parapsilosis NOT DETECTED NOT DETECTED Final   Candida tropicalis NOT DETECTED NOT DETECTED Final   Cryptococcus neoformans/gattii NOT DETECTED NOT DETECTED Final   Meth resistant mecA/C and MREJ NOT DETECTED NOT DETECTED Final    Comment: Performed at [facility], [address], [place], [place] [address]  Blood culture (routine x 2)     Status: Abnormal   Collection Time: 09/22/23  5:07 PM   Specimen: BLOOD  Result Value Ref Range Status   Specimen Description   Final    BLOOD RIGHT ANTECUBITAL Performed at [facility], [address], [place], [place] [address]    Special Requests   Final    BOTTLES DRAWN AEROBIC AND ANAEROBIC Blood Culture adequate volume Performed at [facility], [address], [place], [place] [address]    Culture  Setup Time   Final    GRAM POSITIVE COCCI IN CLUSTERS IN BOTH AEROBIC AND ANAEROBIC BOTTLES CRITICAL VALUE NOTED.  VALUE IS CONSISTENT WITH PREVIOUSLY REPORTED AND CALLED VALUE.    Culture (A)  Final    STAPHYLOCOCCUS AUREUS SUSCEPTIBILITIES PERFORMED ON PREVIOUS CULTURE WITHIN THE LAST 5 DAYS. Performed at [facility], [address], [place], [place] [address]    Report Status 09/25/2023 FINAL  Final  Anaerobic culture w Gram Stain     Status: None   Collection Time: 09/23/23 10:19 AM   Specimen: Joint, Right Hip; Synovial Fluid  Result Value Ref Range Status   Specimen Description   Final    SYNOVIAL Performed at [facility], [address] [place], [place] [address]    Special Requests   Final    NONE RIGHT HIP Performed at [facility], [address] [place], [place] [address]    Gram Stain   Final    MODERATE WBC PRESENT,BOTH PMN AND MONONUCLEAR FEW GRAM POSITIVE COCCI IN CLUSTERS    Culture   Final    NO ANAEROBES  ISOLATED Performed at [facility], [address], [place], [place] [address]    Report Status 09/28/2023 FINAL  Final  Culture, blood (Routine X 2) w Reflex to ID Panel     Status: None (Preliminary result)   Collection Time: 09/24/23  5:57 AM   Specimen: BLOOD RIGHT ARM  Result Value Ref Range Status   Specimen Description   Final    BLOOD RIGHT ARM Performed at [facility], [address] [place], [place] [address]    Special Requests   Final    BOTTLES DRAWN AEROBIC AND ANAEROBIC Blood Culture results may not be optimal due to an inadequate volume of blood received in culture bottles Performed at [facility], [address], [place], [place] [address]    Culture   Final    NO GROWTH 4 DAYS Performed at [facility], [address], [place], [place] [address]    Report Status PENDING  Incomplete  Culture, blood (Routine X 2) w Reflex to ID Panel     Status: None (Preliminary result)   Collection Time: 09/24/23  6:04 AM   Specimen: BLOOD RIGHT ARM  Result Value Ref Range Status   Specimen Description   Final    BLOOD RIGHT ARM Performed at [facility], [address], [place], [place] [address]    Special Requests   Final    BOTTLES DRAWN AEROBIC AND ANAEROBIC Blood Culture adequate volume Performed at [facility], [address], [place], [place] [address]    Culture   Final    NO GROWTH 4 DAYS Performed at [facility], [address] [place], [place] [address]    Report Status PENDING  Incomplete  Aerobic/Anaerobic Culture w Gram Stain (surgical/deep wound)     Status: None (Preliminary result)   Collection Time: 09/27/23  3:05 PM   Specimen: Path Tissue  Result Value Ref Range Status   Specimen Description   Final    TISSUE RIGHT FEMORAL HEAD Performed at [facility], [address] [place], [place] [address]    Special Requests   Final    NONE Performed at [facility], [address] [place], [place] [address]    Gram Stain   Final    RARE WBC PRESENT,BOTH PMN AND MONONUCLEAR NO ORGANISMS SEEN Performed at [facility], [address], [place], [place] [address]    Culture PENDING  Incomplete   Report Status PENDING  Incomplete    Studies/Results: DG Pelvis Portable Result Date: 09/27/2023 CLINICAL DATA:  Septic arthritis EXAM: PORTABLE PELVIS 1-2 VIEWS COMPARISON:  None Available. FINDINGS: Status post placement of antibiotic spacer at the right hip. Surgical drains and skin staples overlie the operative region. IMPRESSION: Status post placement of antibiotic spacer at the right hip. Electronically Signed   By: Franky Stanford M.D.   On: 09/27/2023 22:26   DG HIP UNILAT WITH PELVIS 1V RIGHT Result Date: 09/27/2023 CLINICAL DATA:  Right hip replacement EXAM: DG HIP (WITH OR WITHOUT PELVIS) 1V  RIGHT COMPARISON:  None Available. FINDINGS: Nine fluoroscopic images demonstrate placement of a right hip arthroplasty device. IMPRESSION: Intraoperative fluoroscopy. Electronically Signed   By: Franky Stanford M.D.   On: 09/27/2023 22:17   DG C-Arm 1-60 Min-No Report Result Date: 09/27/2023 Fluoroscopy was utilized by the requesting physician.  No radiographic interpretation.   DG C-Arm 1-60 Min-No Report Result Date: 09/27/2023 Fluoroscopy was utilized by the requesting physician.  No radiographic interpretation.   DG C-Arm 1-60 Min-No Report Result Date: 09/27/2023 Fluoroscopy was utilized by the requesting physician.  No radiographic interpretation.      Assessment/Plan:  INTERVAL HISTORY: pt is sp surgery Principal Problem:   Septic arthritis (HCC) Active Problems:   Hypothyroidism (acquired)   Pain   MSSA bacteremia   Left hip pain   Right hip pain   Encounter for hepatitis C screening test for low risk patient   Acute osteomyelitis involving pelvic region and thigh, right (HCC)   Osteomyelitis of right femur  (HCC)    Melanie Li is a 58 y.o. female with with history of hypothyroidism who underwent corticosteroid injection after a injury while playing pickle ball.   Ultimately after some initial benefit from the injection she had worsening pain which made it difficult for her to even ambulate.   She had workup done for radicular pain with MRI of the spine which was unrevealing and then an MRI of the hip that showed of septic arthritis with a large joint fluid collection as well as destruction of cartilage and bone having been seen.   Was sent to the emergency department by Dr. Fidel due to concerns about her septic hip and desire to have this aspirated and have antibiotics started.  Blood cultures taken on admission have subsequent yielded MSSA aspirate of the joint shows gram-positive cocci in clusters which is undoubtedly the same MSSA she is scheduled for surgery later this week on Wednesday   2D echocardiogram does not show evidence of endocarditis.   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:  Yesterday the patient underwent right Girdlestone procedure with the right femoral head having been resected and sent for aerobic and anaerobic tissue culture, The acetabulum also was found to have severe erosive changes.  There are areas where the cartilage of the entire acetabulum was delaminated with an abscess involving the retroacetabular bone medially sp sequentila reaming of the acetabulum, apparently there was an abscess cavity that tracked all the way to the ischium.  Antibiotic cement was used to reduce the hip  I will continue her cefazolin .  Hopefully her blood cultures that were done 4 days ago are without growth or currently without growth tomorrow we can place a PICC line.  I will plan on giving her 6 weeks of parenteral antibiotics followed by oral antibiotics she has intraoperative cultures that are  also incubating and that we are following.  I will likely follow her IV course of antibiotics with oral antibiotics until we have some degree of confidence that we have achieved cure of this fairly extensive infection  I have personally spent 53 minutes involved in face-to-face and non-face-to-face activities for this patient on the day of the visit. Professional time spent includes the following activities: Preparing to see the patient (review of tests), Obtaining and/or reviewing separately obtained history (admission/discharge record), Performing a medically appropriate examination and/or evaluation , Ordering medications/tests/procedures, referring and communicating with other health care professionals,  Documenting clinical information in the EMR, Independently interpreting results (not separately reported), Communicating results to the patient/family/caregiver, Counseling and educating the patient/family/caregiver and Care coordination (not separately reported).   Evaluation of the patient requires complex antimicrobial therapy evaluation, counseling , isolation needs to reduce disease transmission and risk assessment and mitigation.      Melanie Li 09/28/2023, 2:42 PM

## 2023-09-28 NOTE — Progress Notes (Signed)
 Subjective:  Patient reports pain as mild to moderate.  Denies N/V/CP/SOB/Abd pain. She denies any tingling or numbness in LE bilaterally.  Daughter at bedside.  All questions solicited and answered.  Incentive spirometry provided this morning with instructions for use.   Objective:   VITALS:   Vitals:   09/27/23 1930 09/27/23 1945 09/27/23 2011 09/28/23 0500  BP: 129/85 138/89 (!) 137/92 113/78  Pulse: 79 78 84 89  Resp: 12 12 18 16   Temp:   98.6 F (37 C) 98.6 F (37 C)  TempSrc:   Oral Oral  SpO2: 99% 100% 100% 100%  Weight:      Height:        NAD Neurologically intact ABD soft Neurovascular intact Sensation intact distally Intact pulses distally Dorsiflexion/Plantar flexion intact Incision: dressing C/D/I No cellulitis present Compartment soft   Lab Results  Component Value Date   WBC 17.0 (H) 09/28/2023   HGB 7.0 (L) 09/28/2023   HCT 22.4 (L) 09/28/2023   MCV 93.3 09/28/2023   PLT 472 (H) 09/28/2023   BMET    Component Value Date/Time   NA 132 (L) 09/28/2023 0616   NA 142 11/30/2021 0000   K 3.8 09/28/2023 0616   CL 96 (L) 09/28/2023 0616   CO2 26 09/28/2023 0616   GLUCOSE 108 (H) 09/28/2023 0616   BUN 13 09/28/2023 0616   BUN 13 11/30/2021 0000   CREATININE 0.77 09/28/2023 0616   CALCIUM 8.3 (L) 09/28/2023 0616   EGFR 80 11/30/2021 0000   GFRNONAA >60 09/28/2023 0616   Results for orders placed or performed during the hospital encounter of 09/22/23  Blood culture (routine x 2)     Status: Abnormal   Collection Time: 09/22/23  4:50 PM   Specimen: BLOOD  Result Value Ref Range Status   Specimen Description   Final    BLOOD LEFT ANTECUBITAL Performed at [facility], [address] [place], [place] [address]    Special Requests   Final    BOTTLES DRAWN AEROBIC AND ANAEROBIC Blood Culture adequate volume Performed at [facility], [address] [place], [place] [address]    Culture  Setup Time    Final    GRAM POSITIVE COCCI IN CLUSTERS IN BOTH AEROBIC AND ANAEROBIC BOTTLES CRITICAL RESULT CALLED TO, READ BACK BY AND VERIFIED WITH: MAYA SHUCK SWAYNE [phone] AT 1134 BY EC Performed at [facility], [address], [place], [place] [address]    Culture STAPHYLOCOCCUS AUREUS (A)  Final   Report Status 09/25/2023 FINAL  Final   Organism ID, Bacteria STAPHYLOCOCCUS AUREUS  Final      Susceptibility   Staphylococcus aureus - MIC*    CIPROFLOXACIN <=0.5 SENSITIVE Sensitive     ERYTHROMYCIN <=0.25 SENSITIVE Sensitive     GENTAMICIN <=0.5 SENSITIVE Sensitive     OXACILLIN 0.5 SENSITIVE Sensitive     TETRACYCLINE <=1 SENSITIVE Sensitive     VANCOMYCIN  <=0.5 SENSITIVE Sensitive     TRIMETH/SULFA <=10 SENSITIVE Sensitive     CLINDAMYCIN <=0.25 SENSITIVE Sensitive     RIFAMPIN <=0.5 SENSITIVE Sensitive     Inducible Clindamycin NEGATIVE Sensitive     LINEZOLID 2 SENSITIVE Sensitive     * STAPHYLOCOCCUS AUREUS  Blood Culture ID Panel (Reflexed)     Status: Abnormal   Collection Time: 09/22/23  4:50 PM  Result Value Ref Range Status   Enterococcus faecalis NOT DETECTED NOT DETECTED Final   Enterococcus Faecium NOT DETECTED NOT  DETECTED Final   Listeria monocytogenes NOT DETECTED NOT DETECTED Final   Staphylococcus species DETECTED (A) NOT DETECTED Final    Comment: CRITICAL RESULT CALLED TO, READ BACK BY AND VERIFIED WITH: PHARMD MARY SWAYNE [phone] AT 1134 BY EC    Staphylococcus aureus (BCID) DETECTED (A) NOT DETECTED Final    Comment: CRITICAL RESULT CALLED TO, READ BACK BY AND VERIFIED WITH: PHARMD MARY SWAYNE [phone] AT 1134 BY EC    Staphylococcus epidermidis NOT DETECTED NOT DETECTED Final   Staphylococcus lugdunensis NOT DETECTED NOT DETECTED Final   Streptococcus species NOT DETECTED NOT DETECTED Final   Streptococcus agalactiae NOT DETECTED NOT DETECTED Final   Streptococcus pneumoniae NOT DETECTED NOT DETECTED Final   Streptococcus pyogenes NOT DETECTED NOT  DETECTED Final   A.calcoaceticus-baumannii NOT DETECTED NOT DETECTED Final   Bacteroides fragilis NOT DETECTED NOT DETECTED Final   Enterobacterales NOT DETECTED NOT DETECTED Final   Enterobacter cloacae complex NOT DETECTED NOT DETECTED Final   Escherichia coli NOT DETECTED NOT DETECTED Final   Klebsiella aerogenes NOT DETECTED NOT DETECTED Final   Klebsiella oxytoca NOT DETECTED NOT DETECTED Final   Klebsiella pneumoniae NOT DETECTED NOT DETECTED Final   Proteus species NOT DETECTED NOT DETECTED Final   Salmonella species NOT DETECTED NOT DETECTED Final   Serratia marcescens NOT DETECTED NOT DETECTED Final   Haemophilus influenzae NOT DETECTED NOT DETECTED Final   Neisseria meningitidis NOT DETECTED NOT DETECTED Final   Pseudomonas aeruginosa NOT DETECTED NOT DETECTED Final   Stenotrophomonas maltophilia NOT DETECTED NOT DETECTED Final   Candida albicans NOT DETECTED NOT DETECTED Final   Candida auris NOT DETECTED NOT DETECTED Final   Candida glabrata NOT DETECTED NOT DETECTED Final   Candida krusei NOT DETECTED NOT DETECTED Final   Candida parapsilosis NOT DETECTED NOT DETECTED Final   Candida tropicalis NOT DETECTED NOT DETECTED Final   Cryptococcus neoformans/gattii NOT DETECTED NOT DETECTED Final   Meth resistant mecA/C and MREJ NOT DETECTED NOT DETECTED Final    Comment: Performed at [facility], [address], [place], [place] [address]  Blood culture (routine x 2)     Status: Abnormal   Collection Time: 09/22/23  5:07 PM   Specimen: BLOOD  Result Value Ref Range Status   Specimen Description   Final    BLOOD RIGHT ANTECUBITAL Performed at [facility], [address] [place], [place] [address]    Special Requests   Final    BOTTLES DRAWN AEROBIC AND ANAEROBIC Blood Culture adequate volume Performed at [facility], [address], [place], [place] [address]    Culture  Setup Time   Final    GRAM POSITIVE COCCI IN  CLUSTERS IN BOTH AEROBIC AND ANAEROBIC BOTTLES CRITICAL VALUE NOTED.  VALUE IS CONSISTENT WITH PREVIOUSLY REPORTED AND CALLED VALUE.    Culture (A)  Final    STAPHYLOCOCCUS AUREUS SUSCEPTIBILITIES PERFORMED ON PREVIOUS CULTURE WITHIN THE LAST 5 DAYS. Performed at [facility], [address] [place], [place] [address]    Report Status 09/25/2023 FINAL  Final  Anaerobic culture w Gram Stain     Status: None (Preliminary result)   Collection Time: 09/23/23 10:19 AM   Specimen: Joint, Right Hip; Synovial Fluid  Result Value Ref Range Status   Specimen Description   Final    SYNOVIAL Performed at [facility], [address], [place], [place] [address]    Special Requests   Final    NONE RIGHT  HIP Performed at [facility], [address] [place], [place] [address]    Gram Stain   Final    MODERATE WBC PRESENT,BOTH PMN AND MONONUCLEAR FEW GRAM POSITIVE COCCI IN CLUSTERS Performed at [facility], [address], [place], [place] [address]    Culture   Final    NO ANAEROBES ISOLATED; CULTURE IN PROGRESS FOR 5 DAYS   Report Status PENDING  Incomplete  Culture, blood (Routine X 2) w Reflex to ID Panel     Status: None (Preliminary result)   Collection Time: 09/24/23  5:57 AM   Specimen: BLOOD RIGHT ARM  Result Value Ref Range Status   Specimen Description   Final    BLOOD RIGHT ARM Performed at [facility], [address], [place], [place] [address]    Special Requests   Final    BOTTLES DRAWN AEROBIC AND ANAEROBIC Blood Culture results may not be optimal due to an inadequate volume of blood received in culture bottles Performed at [facility], [address] [place], [place] [address]    Culture   Final    NO GROWTH 4 DAYS Performed at [facility], [address], [place], [place] [address]    Report Status PENDING  Incomplete  Culture, blood (Routine X 2) w Reflex to ID Panel     Status:  None (Preliminary result)   Collection Time: 09/24/23  6:04 AM   Specimen: BLOOD RIGHT ARM  Result Value Ref Range Status   Specimen Description   Final    BLOOD RIGHT ARM Performed at [facility], [address] [place], [place] [address]    Special Requests   Final    BOTTLES DRAWN AEROBIC AND ANAEROBIC Blood Culture adequate volume Performed at [facility], [address], [place], [place] [address]    Culture   Final    NO GROWTH 4 DAYS Performed at [facility], [address], [place], [place] [address]    Report Status PENDING  Incomplete     Assessment/Plan: 1 Day Post-Op   Principal Problem:   Septic arthritis (HCC) Active Problems:   Hypothyroidism (acquired)   Pain   MSSA bacteremia   Left hip pain   Right hip pain   Encounter for hepatitis C screening test for low risk patient  ABLA. Hemoglobin 7.0. Per medical team going to receive 1 unit PRBCs.   TDWB RLE with walker DVT ppx: Aspirin , SCDs, TEDS PO pain control Dispo:  - Patient under care of the medical team, disposition per their recommendation. Patient stated looks like plans for d/c home with PICC line and HHRN pending results of intraoperative cultures. PT to come by today. Continue to ice RLE as needed for pain and swelling. Rolling walker ordered today for home use.    Valery GORMAN Potters 09/28/2023, 10:59 AM   [facility]  [place] [address], [address], [place], [place] [address] Phone: [phone] [email or web address] Facebook  [employer]

## 2023-09-28 NOTE — Plan of Care (Signed)

## 2023-09-28 NOTE — Plan of Care (Signed)
  Problem: Pain Managment: Goal: General experience of comfort will improve and/or be controlled Outcome: Progressing   Problem: Safety: Goal: Ability to remain free from injury will improve Outcome: Progressing   Problem: Skin Integrity: Goal: Risk for impaired skin integrity will decrease Outcome: Progressing

## 2023-09-28 NOTE — Progress Notes (Signed)
 PROGRESS NOTE Melanie Li  FMW:[id number] DOB: May 14, 1966 DOA: 09/22/2023 PCP: Wendolyn Jenkins Jansky, MD  Brief Narrative/Hospital Course: 58 year old with hypothyroidism who had undergone corticosteroid injection after an injury while playing pickle ball but after initial benefit having worsening pain, difficult to ambulate, had MRI of the spine and MRI of the hip that showed septic arthritis with a large joint fluid collection and sent to ED by Dr Fidel due to concern about her septic hip pain desired to have this aspirated and to have antibiotics started.  She is having right hip pain for nearly 1-1/31-month refractory to medications and intra-articular injection. S/p IR guided joint aspiration in 2/1, ID has been consulted, blood culture positive for MSSA and Gram stain on fluid GPC. 2D echocardiogram does not show evidence of endocarditis repeat blood cultures 09/24/23: no growth so far 2/5: s/p right hip girdlestone procedure,placement of articulating antibiotic spacer. Rt fem hear and tissues sent for culture   Subjective: Seen and examined this morning Reports pain is much better able to mobilize But complains of generalized weakness Overnight afebrile BP stable Labs shows hemoglobin has dropped to 7.1 g had 500 cc IntraOp blood loss WBC bumped to 17.0  Assessment and Plan: Principal Problem:   Septic arthritis (HCC) Active Problems:   Hypothyroidism (acquired)   Pain   MSSA bacteremia   Left hip pain   Right hip pain   Encounter for hepatitis C screening test for low risk patient  Right hip MSSA septic arthritis with destruction of cartilage and bone MSSA bacteremia due to above: Blood cx 1/31 mssa. S/p IR guided joint aspiration on 2/1-purulent fluid was obtained and culture- gram stain- GPC In cluster CRP elevated at 22> Down to 16.TTE 09/24/23-EF 60 to 65%, G1 DD, mitral valve normal aortic valve tricuspid cannot exclude small PFO Repeat blood culture 2/2 no growth to date,  hold off on PICC line until 5 days post culture  2/5: s/p right hip girdlestone procedure,placement of articulating antibiotic spacer Rt fem hear and tissues sent for culture-follow-up culture data. Remains on Ancef  IV per ID.  Hypothyroidism:  Continue home levothyroxine    ABLA Acute anemia-likely from acute illness Likely in the setting of acute illness septic arthritis -also had 500 cc blood loss in surgery, Hb a month ago was 12.6 on admission 9.2 and downtrending further 7.0, symptomatic will transfuse 1 unit PRBC-discussed risks benefits patient agreeable. Denies melena.She had colonoscopy more than 10 years ago at [place]  that was negative. Iron panel suggesting iron deficiency. Continue iron supplements.  Recent Labs    09/23/23 1300 09/23/23 1508 09/24/23 0557 09/25/23 0518 09/26/23 0546 09/27/23 0548 09/27/23 1801 09/28/23 0616  HGB 9.2*  --    < > 8.2* 8.0* 8.6* 7.8* 7.0*  MCV  --   --    < > 93.5 93.8 92.7  --  93.3  FERRITIN  --  903*  --   --   --   --   --   --   TIBC 130*  --   --   --   --   --   --   --   IRON 19*  --   --   --   --   --   --   --    < > = values in this interval not displayed.    Mild hyponatremia: Encourage p.o.  Thrombocytosis: Likely reactive.  Monitor  DVT prophylaxis: SCDs Start: 09/28/23 0803 Place TED hose Start: 09/28/23 0803 SCDs Start:  09/22/23 1911 Code Status:   Code Status: Full Code Family Communication: plan of care discussed with patient at bedside. Patient status is: Remains hospitalized because of severity of illness Level of care: Telemetry   Dispo: The patient is from: home            Anticipated disposition: TBD Objective: Vitals last 24 hrs: Vitals:   09/27/23 1930 09/27/23 1945 09/27/23 2011 09/28/23 0500  BP: 129/85 138/89 (!) 137/92 113/78  Pulse: 79 78 84 89  Resp: 12 12 18 16   Temp:   98.6 F (37 C) 98.6 F (37 C)  TempSrc:   Oral Oral  SpO2: 99% 100% 100% 100%  Weight:      Height:        Weight change:   Physical Examination: General exam: alert awake, oriented at baseline, older than stated age HEENT:Oral mucosa moist, Ear/Nose WNL grossly Respiratory system: Bilaterally clear BS,no use of accessory muscle Cardiovascular system: S1 & S2 +, No JVD. Gastrointestinal system: Abdomen soft,NT,ND, BS+ Nervous System: Alert, awake, moving all extremities,and following commands. Extremities:  rt hip surgical site with Aquacel dressing in place Skin: No rashes,no icterus. MSK: Normal muscle bulk,tone, power   Medications reviewed:  Scheduled Meds:  aspirin   81 mg Oral BID   docusate sodium   100 mg Oral BID   Ferrous Fumarate   1 tablet Oral Daily   gabapentin   300 mg Oral QHS   levothyroxine   100 mcg Oral QAC breakfast   senna-docusate  2 tablet Oral BID   sodium chloride  flush  3 mL Intravenous Q12H   Continuous Infusions:  sodium chloride       ceFAZolin  (ANCEF ) IV 2 g (09/28/23 0501)    Diet Order             Diet regular Room service appropriate? Yes; Fluid consistency: Thin  Diet effective now                   Intake/Output Summary (Last 24 hours) at 09/28/2023 0850 Last data filed at 09/27/2023 1807 Gross per 24 hour  Intake 1350 ml  Output 850 ml  Net 500 ml   Net IO Since Admission: 1,020 mL [09/28/23 0850]  Wt Readings from Last 3 Encounters:  09/27/23 70 kg  08/20/23 70.3 kg  02/10/22 73.9 kg     Unresulted Labs (From admission, onward)     Start     Ordered   09/27/23 1701  Aerobic/Anaerobic Culture w Gram Stain (surgical/deep wound)  RELEASE UPON ORDERING,   STAT       Comments: Specimen A: Specimen Description Right femoral head Phone [phone] Immunocompromised?  No  Antibiotic Treatment:  none Is the patient on airborne/droplet precautions? No Clinical History:  N/A Special Instructions:  none Specimen Disposition:  Microbiology     09/27/23 1701   09/26/23 0500  CBC  Daily,   R      09/25/23 1251   09/26/23 0500  Basic  metabolic panel  Daily,   R      09/25/23 1251          Data Reviewed: I have personally reviewed following labs and imaging studies CBC: Recent Labs  Lab 09/22/23 1326 09/23/23 0455 09/24/23 0557 09/25/23 0518 09/26/23 0546 09/27/23 0548 09/27/23 1801 09/28/23 0616  WBC 14.0*   < > 11.8* 11.3* 12.8* 11.6*  --  17.0*  NEUTROABS 10.8*  --   --   --   --   --   --   --  HGB 9.3*   < > 8.6* 8.2* 8.0* 8.6* 7.8* 7.0*  HCT 28.7*   < > 27.4* 25.8* 25.6* 26.7* 23.0* 22.4*  MCV 90.5   < > 95.5 93.5 93.8 92.7  --  93.3  PLT 410*   < > 383 397 493* 451*  --  472*   < > = values in this interval not displayed.   Basic Metabolic Panel:  Recent Labs  Lab 09/23/23 0455 09/24/23 0557 09/26/23 0546 09/27/23 0548 09/27/23 1801 09/28/23 0616  NA 136 136 137 136 136 132*  K 3.4* 4.4 3.7 3.8 4.0 3.8  CL 104 106 104 101  --  96*  CO2 23 22 23 25   --  26  GLUCOSE 101* 95 100* 105*  --  108*  BUN 14 14 12 10   --  13  CREATININE 0.75 0.72 0.68 0.40*  --  0.77  CALCIUM 8.6* 8.5* 8.6* 8.7*  --  8.3*  MG 2.0  --   --   --   --   --    GFR: Estimated Creatinine Clearance: 76.4 mL/min (by C-G formula based on SCr of 0.77 mg/dL). Liver Function Tests:  Recent Labs  Lab 09/22/23 1326  AST 29  ALT 26  ALKPHOS 68  BILITOT 0.9  PROT 7.1  ALBUMIN  2.6*   Recent Results (from the past 240 hours)  Blood culture (routine x 2)     Status: Abnormal   Collection Time: 09/22/23  4:50 PM   Specimen: BLOOD  Result Value Ref Range Status   Specimen Description   Final    BLOOD LEFT ANTECUBITAL Performed at [facility], [address], [place], [place] [address]    Special Requests   Final    BOTTLES DRAWN AEROBIC AND ANAEROBIC Blood Culture adequate volume Performed at [facility], [address], [place], [place] [address]    Culture  Setup Time   Final    GRAM POSITIVE COCCI IN CLUSTERS IN BOTH AEROBIC AND ANAEROBIC BOTTLES CRITICAL RESULT CALLED  TO, READ BACK BY AND VERIFIED WITH: MAYA SHUCK SWAYNE [id number] AT 1134 BY EC Performed at [facility], [address] [place], [place] [address]    Culture STAPHYLOCOCCUS AUREUS (A)  Final   Report Status 09/25/2023 FINAL  Final   Organism ID, Bacteria STAPHYLOCOCCUS AUREUS  Final      Susceptibility   Staphylococcus aureus - MIC*    CIPROFLOXACIN <=0.5 SENSITIVE Sensitive     ERYTHROMYCIN <=0.25 SENSITIVE Sensitive     GENTAMICIN <=0.5 SENSITIVE Sensitive     OXACILLIN 0.5 SENSITIVE Sensitive     TETRACYCLINE <=1 SENSITIVE Sensitive     VANCOMYCIN  <=0.5 SENSITIVE Sensitive     TRIMETH/SULFA <=10 SENSITIVE Sensitive     CLINDAMYCIN <=0.25 SENSITIVE Sensitive     RIFAMPIN <=0.5 SENSITIVE Sensitive     Inducible Clindamycin NEGATIVE Sensitive     LINEZOLID 2 SENSITIVE Sensitive     * STAPHYLOCOCCUS AUREUS  Blood Culture ID Panel (Reflexed)     Status: Abnormal   Collection Time: 09/22/23  4:50 PM  Result Value Ref Range Status   Enterococcus faecalis NOT DETECTED NOT DETECTED Final   Enterococcus Faecium NOT DETECTED NOT DETECTED Final   Listeria monocytogenes NOT DETECTED NOT DETECTED Final   Staphylococcus species DETECTED (A) NOT DETECTED Final    Comment: CRITICAL RESULT CALLED TO, READ BACK BY AND VERIFIED WITH: [facility] MARY SWAYNE [id number] AT 1134 BY EC  Staphylococcus aureus (BCID) DETECTED (A) NOT DETECTED Final    Comment: CRITICAL RESULT CALLED TO, READ BACK BY AND VERIFIED WITH: PHARMD MARY SWAYNE [id number] AT 1134 BY EC    Staphylococcus epidermidis NOT DETECTED NOT DETECTED Final   Staphylococcus lugdunensis NOT DETECTED NOT DETECTED Final   Streptococcus species NOT DETECTED NOT DETECTED Final   Streptococcus agalactiae NOT DETECTED NOT DETECTED Final   Streptococcus pneumoniae NOT DETECTED NOT DETECTED Final   Streptococcus pyogenes NOT DETECTED NOT DETECTED Final   A.calcoaceticus-baumannii NOT DETECTED NOT DETECTED Final   Bacteroides fragilis NOT  DETECTED NOT DETECTED Final   Enterobacterales NOT DETECTED NOT DETECTED Final   Enterobacter cloacae complex NOT DETECTED NOT DETECTED Final   Escherichia coli NOT DETECTED NOT DETECTED Final   Klebsiella aerogenes NOT DETECTED NOT DETECTED Final   Klebsiella oxytoca NOT DETECTED NOT DETECTED Final   Klebsiella pneumoniae NOT DETECTED NOT DETECTED Final   Proteus species NOT DETECTED NOT DETECTED Final   Salmonella species NOT DETECTED NOT DETECTED Final   Serratia marcescens NOT DETECTED NOT DETECTED Final   Haemophilus influenzae NOT DETECTED NOT DETECTED Final   Neisseria meningitidis NOT DETECTED NOT DETECTED Final   Pseudomonas aeruginosa NOT DETECTED NOT DETECTED Final   Stenotrophomonas maltophilia NOT DETECTED NOT DETECTED Final   Candida albicans NOT DETECTED NOT DETECTED Final   Candida auris NOT DETECTED NOT DETECTED Final   Candida glabrata NOT DETECTED NOT DETECTED Final   Candida krusei NOT DETECTED NOT DETECTED Final   Candida parapsilosis NOT DETECTED NOT DETECTED Final   Candida tropicalis NOT DETECTED NOT DETECTED Final   Cryptococcus neoformans/gattii NOT DETECTED NOT DETECTED Final   Meth resistant mecA/C and MREJ NOT DETECTED NOT DETECTED Final    Comment: Performed at [facility], [address], [place], [place] [address]  Blood culture (routine x 2)     Status: Abnormal   Collection Time: 09/22/23  5:07 PM   Specimen: BLOOD  Result Value Ref Range Status   Specimen Description   Final    BLOOD RIGHT ANTECUBITAL Performed at [facility], [address], [place], [place] [address]    Special Requests   Final    BOTTLES DRAWN AEROBIC AND ANAEROBIC Blood Culture adequate volume Performed at [facility], [address] [place] [address], [place], [place] [address]    Culture  Setup Time   Final    GRAM POSITIVE COCCI IN CLUSTERS IN BOTH AEROBIC AND ANAEROBIC BOTTLES CRITICAL VALUE NOTED.  VALUE IS CONSISTENT WITH PREVIOUSLY  REPORTED AND CALLED VALUE.    Culture (A)  Final    STAPHYLOCOCCUS AUREUS SUSCEPTIBILITIES PERFORMED ON PREVIOUS CULTURE WITHIN THE LAST 5 DAYS. Performed at [facility], [address], [place], [place] [address]    Report Status 09/25/2023 FINAL  Final  Anaerobic culture w Gram Stain     Status: None (Preliminary result)   Collection Time: 09/23/23 10:19 AM   Specimen: Joint, Right Hip; Synovial Fluid  Result Value Ref Range Status   Specimen Description   Final    SYNOVIAL Performed at [facility], [address], [place], [place] [address]    Special Requests   Final    NONE RIGHT HIP Performed at [facility], [address] [place], [place] [address]    Gram Stain   Final    MODERATE WBC PRESENT,BOTH PMN AND MONONUCLEAR FEW GRAM POSITIVE COCCI IN CLUSTERS Performed at [facility], [address] [place], [place] [address]    Culture   Final    NO ANAEROBES ISOLATED; CULTURE IN PROGRESS FOR 5 DAYS   Report Status PENDING  Incomplete  Culture, blood (Routine X 2) w Reflex to ID Panel     Status: None (Preliminary result)   Collection Time: 09/24/23  5:57 AM   Specimen: BLOOD RIGHT ARM  Result Value Ref Range Status   Specimen Description   Final    BLOOD RIGHT ARM Performed at [facility], [address] [place], [place] [address]    Special Requests   Final    BOTTLES DRAWN AEROBIC AND ANAEROBIC Blood Culture results may not be optimal due to an inadequate volume of blood received in culture bottles Performed at [facility], [address], [place], [place] [address]    Culture   Final    NO GROWTH 4 DAYS Performed at [facility], [address] [place], [place] [address]    Report Status PENDING  Incomplete  Culture, blood (Routine X 2) w Reflex to ID Panel     Status: None (Preliminary result)   Collection Time: 09/24/23  6:04 AM   Specimen: BLOOD RIGHT ARM  Result Value  Ref Range Status   Specimen Description   Final    BLOOD RIGHT ARM Performed at [facility], [address], [place], [place] [address]    Special Requests   Final    BOTTLES DRAWN AEROBIC AND ANAEROBIC Blood Culture adequate volume Performed at [facility], [address], [place], [place] [address]    Culture   Final    NO GROWTH 4 DAYS Performed at [facility], [address], [place], [place] [address]    Report Status PENDING  Incomplete    Antimicrobials/Microbiology: Anti-infectives (From admission, onward)    Start     Dose/Rate Route Frequency Ordered Stop   09/27/23 1819  vancomycin  (VANCOCIN ) powder  Status:  Discontinued          As needed 09/27/23 1820 09/27/23 1957   09/27/23 1707  tobramycin  (NEBCIN ) powder  Status:  Discontinued          As needed 09/27/23 1707 09/27/23 1957   09/27/23 1400  ceFAZolin  (ANCEF ) IVPB 2g/100 mL premix        2 g 200 mL/hr over 30 Minutes Intravenous On call to O.R. 09/27/23 1255 09/27/23 1637   09/23/23 1530  ceFAZolin  (ANCEF ) IVPB 2g/100 mL premix        2 g 200 mL/hr over 30 Minutes Intravenous Every 8 hours 09/23/23 1433     09/23/23 1130  cefTRIAXone  (ROCEPHIN ) 2 g in sodium chloride  0.9 % 100 mL IVPB  Status:  Discontinued        2 g 200 mL/hr over 30 Minutes Intravenous Daily 09/23/23 1112 09/23/23 1432   09/23/23 1115  vancomycin  (VANCOREADY) IVPB 1500 mg/300 mL  Status:  Discontinued        1,500 mg 150 mL/hr over 120 Minutes Intravenous  Once 09/23/23 1112 09/23/23 1432         Component Value Date/Time   SDES  09/24/2023 0604    BLOOD RIGHT ARM Performed at [facility], [address] [place], [place] [address]    SPECREQUEST  09/24/2023 0604    BOTTLES DRAWN AEROBIC AND ANAEROBIC Blood Culture adequate volume Performed at [facility], [address], [place], [place] [address]  CULT  09/24/2023 0604    NO GROWTH 4 DAYS Performed at [facility], [address] [place], [place] [address]    REPTSTATUS PENDING 09/24/2023 9395     Radiology Studies: DG Pelvis Portable Result Date: 09/27/2023 CLINICAL DATA:  Septic arthritis EXAM: PORTABLE PELVIS 1-2 VIEWS COMPARISON:  None Available. FINDINGS: Status post placement of antibiotic spacer at the right hip. Surgical drains and skin staples overlie the operative region. IMPRESSION: Status post placement of antibiotic spacer at the right hip. Electronically Signed   By: Franky Stanford M.D.   On: 09/27/2023 22:26   DG HIP UNILAT WITH PELVIS 1V RIGHT Result Date: 09/27/2023 CLINICAL DATA:  Right hip replacement EXAM: DG HIP (WITH OR WITHOUT PELVIS) 1V RIGHT COMPARISON:  None Available. FINDINGS: Nine fluoroscopic images demonstrate placement of a right hip arthroplasty device. IMPRESSION: Intraoperative fluoroscopy. Electronically Signed   By: Franky Stanford M.D.   On: 09/27/2023 22:17   DG C-Arm 1-60 Min-No Report Result Date: 09/27/2023 Fluoroscopy was utilized by the requesting physician.  No radiographic interpretation.   DG C-Arm 1-60 Min-No Report Result Date: 09/27/2023 Fluoroscopy was utilized by the requesting physician.  No radiographic interpretation.   DG C-Arm 1-60 Min-No Report Result Date: 09/27/2023 Fluoroscopy was utilized by the requesting physician.  No radiographic interpretation.     LOS: 6 days   Total time spent in review of labs and imaging, patient evaluation, formulation of plan, documentation and communication with family: 35 minutes  Mennie LAMY, MD  [facility]  09/28/2023, 8:50 AM

## 2023-09-29 ENCOUNTER — Other Ambulatory Visit: Payer: Self-pay

## 2023-09-29 ENCOUNTER — Encounter (HOSPITAL_COMMUNITY): Payer: Self-pay | Admitting: Orthopedic Surgery

## 2023-09-29 DIAGNOSIS — M86151 Other acute osteomyelitis, right femur: Secondary | ICD-10-CM | POA: Diagnosis not present

## 2023-09-29 DIAGNOSIS — M86251 Subacute osteomyelitis, right femur: Secondary | ICD-10-CM | POA: Diagnosis not present

## 2023-09-29 DIAGNOSIS — M25551 Pain in right hip: Secondary | ICD-10-CM | POA: Diagnosis not present

## 2023-09-29 DIAGNOSIS — M00051 Staphylococcal arthritis, right hip: Secondary | ICD-10-CM | POA: Diagnosis not present

## 2023-09-29 LAB — CULTURE, BLOOD (ROUTINE X 2)
Culture: NO GROWTH
Culture: NO GROWTH
Special Requests: ADEQUATE

## 2023-09-29 LAB — CBC
HCT: 20.4 % — ABNORMAL LOW (ref 36.0–46.0)
HCT: 23.4 % — ABNORMAL LOW (ref 36.0–46.0)
Hemoglobin: 6.4 g/dL — CL (ref 12.0–15.0)
Hemoglobin: 7.3 g/dL — ABNORMAL LOW (ref 12.0–15.0)
MCH: 29.8 pg (ref 26.0–34.0)
MCH: 29.3 pg (ref 26.0–34.0)
MCHC: 31.4 g/dL (ref 30.0–36.0)
MCHC: 31.2 g/dL (ref 30.0–36.0)
MCV: 94.9 fL (ref 80.0–100.0)
MCV: 94 fL (ref 80.0–100.0)
Platelets: 355 10*3/uL (ref 150–400)
Platelets: 447 10*3/uL — ABNORMAL HIGH (ref 150–400)
RBC: 2.15 MIL/uL — ABNORMAL LOW (ref 3.87–5.11)
RBC: 2.49 MIL/uL — ABNORMAL LOW (ref 3.87–5.11)
RDW: 13.2 % (ref 11.5–15.5)
RDW: 13.2 % (ref 11.5–15.5)
WBC: 11.5 10*3/uL — ABNORMAL HIGH (ref 4.0–10.5)
WBC: 13.3 10*3/uL — ABNORMAL HIGH (ref 4.0–10.5)
nRBC: 0 % (ref 0.0–0.2)
nRBC: 0 % (ref 0.0–0.2)

## 2023-09-29 LAB — BASIC METABOLIC PANEL
Anion gap: 17 — ABNORMAL HIGH (ref 5–15)
Anion gap: 9 (ref 5–15)
BUN: 11 mg/dL (ref 6–20)
BUN: 12 mg/dL (ref 6–20)
CO2: 23 mmol/L (ref 22–32)
CO2: 26 mmol/L (ref 22–32)
Calcium: 7.7 mg/dL — ABNORMAL LOW (ref 8.9–10.3)
Calcium: 9 mg/dL (ref 8.9–10.3)
Chloride: 88 mmol/L — ABNORMAL LOW (ref 98–111)
Chloride: 103 mmol/L (ref 98–111)
Creatinine, Ser: 1.51 mg/dL — ABNORMAL HIGH (ref 0.44–1.00)
Creatinine, Ser: 0.66 mg/dL (ref 0.44–1.00)
GFR, Estimated: 40 mL/min — ABNORMAL LOW (ref >60)
GFR, Estimated: >60 mL/min (ref >60)
Glucose, Bld: 569 mg/dL (ref 70–99)
Glucose, Bld: 95 mg/dL (ref 70–99)
Potassium: 3 mmol/L — ABNORMAL LOW (ref 3.5–5.1)
Potassium: 3.4 mmol/L — ABNORMAL LOW (ref 3.5–5.1)
Sodium: 128 mmol/L — ABNORMAL LOW (ref 135–145)
Sodium: 138 mmol/L (ref 135–145)

## 2023-09-29 LAB — HEMOGLOBIN AND HEMATOCRIT, BLOOD
HCT: 23.4 % — ABNORMAL LOW (ref 36.0–46.0)
Hemoglobin: 7.4 g/dL — ABNORMAL LOW (ref 12.0–15.0)

## 2023-09-29 LAB — PREPARE RBC (CROSSMATCH)

## 2023-09-29 LAB — GLUCOSE, CAPILLARY: Glucose-Capillary: 86 mg/dL (ref 70–99)

## 2023-09-29 MED ORDER — POTASSIUM CHLORIDE CRYS ER 20 MEQ PO TBCR
40.0000 meq | EXTENDED_RELEASE_TABLET | Freq: Once | ORAL | Status: AC
  Administered 2023-09-29: 40 meq via ORAL
  Filled 2023-09-29: qty 2

## 2023-09-29 MED ORDER — SODIUM CHLORIDE 0.9% IV SOLUTION: Freq: Once | INTRAVENOUS | Status: DC

## 2023-09-29 NOTE — Progress Notes (Signed)
 PHARMACY CONSULT NOTE FOR:  OUTPATIENT  PARENTERAL ANTIBIOTIC THERAPY (OPAT)  Indication: MSSA bacteremia/septic arthritis of hip Regimen: Cefazolin  2 gm IV Q 8 hours  End date: 11/07/2023  IV antibiotic discharge orders are pended. To discharging provider:  please sign these orders via discharge navigator,  Select New Orders & click on the button choice - Manage This Unsigned Work.     Thank you for allowing pharmacy to be a part of this patient's care.  Damien Quiet, PharmD, BCPS, BCIDP Infectious Diseases Clinical Pharmacist Phone: [phone] 09/29/2023, 11:10 AM

## 2023-09-29 NOTE — TOC Progression Note (Signed)
 Transition of Care [facility]) - Progression Note    Patient Details  Name: Melanie Li MRN: [id number] Date of Birth: Jan 22, 1966  Transition of Care [facility] CM/SW Contact  Sheri ONEIDA Sharps, LCSW Phone Number: 09/29/2023, 3:45 PM  Clinical Narrative:     Pt recommended for RW. RW ordered w/ [employer]. IV ABX setup w/ [employer]. HHRN pending. Continue following for HHPT needs.        Expected Discharge Plan and Services                                               Social Determinants of Health (SDOH) Interventions SDOH Screenings   Food Insecurity: No Food Insecurity (09/23/2023)  Housing: Low Risk  (09/23/2023)  Transportation Needs: No Transportation Needs (09/23/2023)  Utilities: Not At Risk (09/23/2023)  Depression (PHQ2-9): Low Risk  (09/29/2021)  Tobacco Use: Low Risk  (09/27/2023)    Readmission Risk Interventions    09/25/2023   10:27 AM  Readmission Risk Prevention Plan  Post Dischage Appt Complete  Medication Screening Complete  Transportation Screening Complete

## 2023-09-29 NOTE — Progress Notes (Signed)
 PROGRESS NOTE Melanie Li  FMW:[id number] DOB: 04-May-1966 DOA: 09/22/2023 PCP: Wendolyn Jenkins Jansky, MD  Brief Narrative/Hospital Course: 58 year old with hypothyroidism who had undergone corticosteroid injection after an injury while playing pickle ball but after initial benefit having worsening pain, difficult to ambulate, had MRI of the spine and MRI of the hip that showed septic arthritis with a large joint fluid collection and sent to ED by Dr Fidel due to concern about her septic hip pain desired to have this aspirated and to have antibiotics started.  She is having right hip pain for nearly 1-1/27-month refractory to medications and intra-articular injection. S/p IR guided joint aspiration in 2/1, ID has been consulted, blood culture positive for MSSA and Gram stain on fluid GPC. 2D echocardiogram does not show evidence of endocarditis repeat blood cultures 09/24/23: no growth so far 2/5: s/p right hip girdlestone procedure,placement of articulating antibiotic spacer. Rt fem hear and tissues sent for culture   Subjective: Patient seen regarding Pain in HydroControl Overnight patient afebrile BP stable Received 1 unit PRBC yesterday labs this morning hemoglobin down 6.5 blood sugar-[id number] repeat ordered to verify so hemoglobin 7.3 g normal blood sugar and electrolytes with mild hypokalemia  Assessment and Plan: Principal Problem:   Septic arthritis (HCC) Active Problems:   Hypothyroidism (acquired)   Pain   MSSA bacteremia   Left hip pain   Right hip pain   Encounter for hepatitis C screening test for low risk patient   Acute osteomyelitis involving pelvic region and thigh, right (HCC)   Osteomyelitis of right femur (HCC)  Right hip MSSA septic arthritis with destruction of cartilage and bone MSSA bacteremia due to above: Blood cx 1/31 mssa. S/p IR guided joint aspiration on 2/1-purulent fluid was obtained and culture- gram stain- GPC In cluster CRP elevated at 22> Down to 16.TTE  09/24/23-EF 60 to 65%, G1 DD, mitral valve normal aortic valve tricuspid cannot exclude small PFO 2/5: s/p right hip girdlestone procedure,placement of articulating antibiotic spacer Rt fem headand tissues sent for culture- gram stain no organism-IntraOp INTRA-OP c/s pending Repeat blood culture from  2/2 NGTD on day 5 on 2/7- per ID planning PICC placement today  Continue Ancef  for now and plan for home with IV antibiotics  Hypothyroidism:  Continue home levothyroxine    ABLA Acute anemia-likely from acute illness Likely in the setting of acute illness septic arthritis -also had 500 cc blood loss in surgery Hb a month ago was 12.6 on admission 9.2 and downtrending further 7.0> transfuse 1 unit PRBC 2/6.  Recheck labs this morning low unusually, repeat labs ordered -hemoglobin up at 7.3- recheck later today if decreasing further will transfuse 1 more unit PRBC.   Iron panel suggesting iron deficiency.cont iron supplement Denies melena.She had colonoscopy more than 10 years ago at [place]  that was negative. Recent Labs    09/23/23 1300 09/23/23 1508 09/24/23 0557 09/27/23 0548 09/27/23 1801 09/28/23 0616 09/29/23 0540 09/29/23 0800  HGB 9.2*  --    < > 8.6* 7.8* 7.0* 6.4* 7.3*  MCV  --   --    < > 92.7  --  93.3 94.9 94.0  FERRITIN  --  903*  --   --   --   --   --   --   TIBC 130*  --   --   --   --   --   --   --   IRON 19*  --   --   --   --   --   --   --    < > =  values in this interval not displayed.    Mild hyponatremia: Encourage p.o.  Thrombocytosis: Likely reactive.  Monitor  Hypokalemia: Replaced  DVT prophylaxis: SCDs Start: 09/28/23 0803 Place TED hose Start: 09/28/23 0803 SCDs Start: 09/22/23 1911 Code Status:   Code Status: Full Code Family Communication: plan of care discussed with patient at bedside. Patient status is: Remains hospitalized because of severity of illness Level of care: Telemetry   Dispo: The patient is from: home            Anticipated  disposition: home /w picc line TBD I n24 hrs Objective: Vitals last 24 hrs: Vitals:   09/28/23 2349 09/29/23 0023 09/29/23 0258 09/29/23 0513  BP: 107/70 108/67 113/71 109/75  Pulse: 96 93 76 78  Resp: 20 20 18 15   Temp: 98.7 F (37.1 C) 98.5 F (36.9 C) 99.3 F (37.4 C) 98 F (36.7 C)  TempSrc: Oral Axillary Oral Oral  SpO2: 97% 97% 98% 98%  Weight:      Height:       Weight change:   Physical Examination: General exam: alert awake, oriented at baseline, older than stated age HEENT:Oral mucosa moist, Ear/Nose WNL grossly Respiratory system: Bilaterally clear BS,no use of accessory muscle Cardiovascular system: S1 & S2 +, No JVD. Gastrointestinal system: Abdomen soft,NT,ND, BS+ Nervous System: Alert, awake, moving all extremities,and following commands. Extremities: rt hip w/ dressing aquacel +LE edema neg,distal peripheral pulses palpable and warm.  Skin: No rashes,no icterus. MSK: Normal muscle bulk,tone, power   Medications reviewed:  Scheduled Meds:  sodium chloride    Intravenous Once   aspirin   81 mg Oral BID   docusate sodium   100 mg Oral BID   Ferrous Fumarate   1 tablet Oral Daily   gabapentin   300 mg Oral QHS   levothyroxine   100 mcg Oral QAC breakfast   senna-docusate  2 tablet Oral BID   sodium chloride  flush  3 mL Intravenous Q12H   Continuous Infusions:   ceFAZolin  (ANCEF ) IV 2 g (09/29/23 0529)    Diet Order             Diet regular Room service appropriate? Yes; Fluid consistency: Thin  Diet effective now                   Intake/Output Summary (Last 24 hours) at 09/29/2023 1022 Last data filed at 09/29/2023 0258 Gross per 24 hour  Intake 1473.67 ml  Output --  Net 1473.67 ml   Net IO Since Admission: 2,493.67 mL [09/29/23 1022]  Wt Readings from Last 3 Encounters:  09/27/23 70 kg  08/20/23 70.3 kg  02/10/22 73.9 kg     Unresulted Labs (From admission, onward)     Start     Ordered   09/29/23 1700  Hemoglobin and hematocrit, blood   Once,   R        09/29/23 1022   09/26/23 0500  CBC  Daily,   R      09/25/23 1251   09/26/23 0500  Basic metabolic panel  Daily,   R      09/25/23 1251          Data Reviewed: I have personally reviewed following labs and imaging studies CBC: Recent Labs  Lab 09/22/23 1326 09/23/23 0455 09/26/23 0546 09/27/23 0548 09/27/23 1801 09/28/23 0616 09/29/23 0540 09/29/23 0800  WBC 14.0*   < > 12.8* 11.6*  --  17.0* 11.5* 13.3*  NEUTROABS 10.8*  --   --   --   --   --   --   --  HGB 9.3*   < > 8.0* 8.6* 7.8* 7.0* 6.4* 7.3*  HCT 28.7*   < > 25.6* 26.7* 23.0* 22.4* 20.4* 23.4*  MCV 90.5   < > 93.8 92.7  --  93.3 94.9 94.0  PLT 410*   < > 493* 451*  --  472* 355 447*   < > = values in this interval not displayed.   Basic Metabolic Panel:  Recent Labs  Lab 09/23/23 0455 09/24/23 0557 09/26/23 0546 09/27/23 0548 09/27/23 1801 09/28/23 0616 09/29/23 0540 09/29/23 0800  NA 136   < > 137 136 136 132* 128* 138  K 3.4*   < > 3.7 3.8 4.0 3.8 3.0* 3.4*  CL 104   < > 104 101  --  96* 88* 103  CO2 23   < > 23 25  --  26 23 26   GLUCOSE 101*   < > 100* 105*  --  108* 569* 95  BUN 14   < > 12 10  --  13 11 12   CREATININE 0.75   < > 0.68 0.40*  --  0.77 1.51* 0.66  CALCIUM 8.6*   < > 8.6* 8.7*  --  8.3* 7.7* 9.0  MG 2.0  --   --   --   --   --   --   --    < > = values in this interval not displayed.   GFR: Estimated Creatinine Clearance: 76.4 mL/min (by C-G formula based on SCr of 0.66 mg/dL). Liver Function Tests:  Recent Labs  Lab 09/22/23 1326  AST 29  ALT 26  ALKPHOS 68  BILITOT 0.9  PROT 7.1  ALBUMIN  2.6*   Recent Results (from the past 240 hours)  Blood culture (routine x 2)     Status: Abnormal   Collection Time: 09/22/23  4:50 PM   Specimen: BLOOD  Result Value Ref Range Status   Specimen Description   Final    BLOOD LEFT ANTECUBITAL Performed at [facility], [address] [place], [place] [address]    Special Requests   Final     BOTTLES DRAWN AEROBIC AND ANAEROBIC Blood Culture adequate volume Performed at [facility], [address] [place], [place] [address]    Culture  Setup Time   Final    GRAM POSITIVE COCCI IN CLUSTERS IN BOTH AEROBIC AND ANAEROBIC BOTTLES CRITICAL RESULT CALLED TO, READ BACK BY AND VERIFIED WITH: MAYA SHUCK SWAYNE [phone] AT 1134 BY EC Performed at [facility], [address], [place], [place] [address]    Culture STAPHYLOCOCCUS AUREUS (A)  Final   Report Status 09/25/2023 FINAL  Final   Organism ID, Bacteria STAPHYLOCOCCUS AUREUS  Final      Susceptibility   Staphylococcus aureus - MIC*    CIPROFLOXACIN <=0.5 SENSITIVE Sensitive     ERYTHROMYCIN <=0.25 SENSITIVE Sensitive     GENTAMICIN <=0.5 SENSITIVE Sensitive     OXACILLIN 0.5 SENSITIVE Sensitive     TETRACYCLINE <=1 SENSITIVE Sensitive     VANCOMYCIN  <=0.5 SENSITIVE Sensitive     TRIMETH/SULFA <=10 SENSITIVE Sensitive     CLINDAMYCIN <=0.25 SENSITIVE Sensitive     RIFAMPIN <=0.5 SENSITIVE Sensitive     Inducible Clindamycin NEGATIVE Sensitive     LINEZOLID 2 SENSITIVE Sensitive     * STAPHYLOCOCCUS AUREUS  Blood Culture ID Panel (Reflexed)     Status: Abnormal   Collection Time: 09/22/23  4:50 PM  Result Value Ref Range Status  Enterococcus faecalis NOT DETECTED NOT DETECTED Final   Enterococcus Faecium NOT DETECTED NOT DETECTED Final   Listeria monocytogenes NOT DETECTED NOT DETECTED Final   Staphylococcus species DETECTED (A) NOT DETECTED Final    Comment: CRITICAL RESULT CALLED TO, READ BACK BY AND VERIFIED WITH: PHARMD MARY SWAYNE [phone] AT 1134 BY EC    Staphylococcus aureus (BCID) DETECTED (A) NOT DETECTED Final    Comment: CRITICAL RESULT CALLED TO, READ BACK BY AND VERIFIED WITH: PHARMD MARY SWAYNE [phone] AT 1134 BY EC    Staphylococcus epidermidis NOT DETECTED NOT DETECTED Final   Staphylococcus lugdunensis NOT DETECTED NOT DETECTED Final   Streptococcus species NOT DETECTED  NOT DETECTED Final   Streptococcus agalactiae NOT DETECTED NOT DETECTED Final   Streptococcus pneumoniae NOT DETECTED NOT DETECTED Final   Streptococcus pyogenes NOT DETECTED NOT DETECTED Final   A.calcoaceticus-baumannii NOT DETECTED NOT DETECTED Final   Bacteroides fragilis NOT DETECTED NOT DETECTED Final   Enterobacterales NOT DETECTED NOT DETECTED Final   Enterobacter cloacae complex NOT DETECTED NOT DETECTED Final   Escherichia coli NOT DETECTED NOT DETECTED Final   Klebsiella aerogenes NOT DETECTED NOT DETECTED Final   Klebsiella oxytoca NOT DETECTED NOT DETECTED Final   Klebsiella pneumoniae NOT DETECTED NOT DETECTED Final   Proteus species NOT DETECTED NOT DETECTED Final   Salmonella species NOT DETECTED NOT DETECTED Final   Serratia marcescens NOT DETECTED NOT DETECTED Final   Haemophilus influenzae NOT DETECTED NOT DETECTED Final   Neisseria meningitidis NOT DETECTED NOT DETECTED Final   Pseudomonas aeruginosa NOT DETECTED NOT DETECTED Final   Stenotrophomonas maltophilia NOT DETECTED NOT DETECTED Final   Candida albicans NOT DETECTED NOT DETECTED Final   Candida auris NOT DETECTED NOT DETECTED Final   Candida glabrata NOT DETECTED NOT DETECTED Final   Candida krusei NOT DETECTED NOT DETECTED Final   Candida parapsilosis NOT DETECTED NOT DETECTED Final   Candida tropicalis NOT DETECTED NOT DETECTED Final   Cryptococcus neoformans/gattii NOT DETECTED NOT DETECTED Final   Meth resistant mecA/C and MREJ NOT DETECTED NOT DETECTED Final    Comment: Performed at [facility], [address], [place], [place] [address]  Blood culture (routine x 2)     Status: Abnormal   Collection Time: 09/22/23  5:07 PM   Specimen: BLOOD  Result Value Ref Range Status   Specimen Description   Final    BLOOD RIGHT ANTECUBITAL Performed at [facility], [address], [place], [place] [address]    Special Requests   Final    BOTTLES DRAWN AEROBIC AND ANAEROBIC Blood  Culture adequate volume Performed at [facility], [address] [place], [place] [address]    Culture  Setup Time   Final    GRAM POSITIVE COCCI IN CLUSTERS IN BOTH AEROBIC AND ANAEROBIC BOTTLES CRITICAL VALUE NOTED.  VALUE IS CONSISTENT WITH PREVIOUSLY REPORTED AND CALLED VALUE.    Culture (A)  Final    STAPHYLOCOCCUS AUREUS SUSCEPTIBILITIES PERFORMED ON PREVIOUS CULTURE WITHIN THE LAST 5 DAYS. Performed at [facility], [address], [place], [place] [address]    Report Status 09/25/2023 FINAL  Final  Anaerobic culture w Gram Stain     Status: None   Collection Time: 09/23/23 10:19 AM   Specimen: Joint, Right Hip; Synovial Fluid  Result Value Ref Range Status   Specimen Description   Final    SYNOVIAL Performed at [facility], [address] [place], [place] [address]  Special Requests   Final    NONE RIGHT HIP Performed at [facility], [address], [place], [place] [address]    Gram Stain   Final    MODERATE WBC PRESENT,BOTH PMN AND MONONUCLEAR FEW GRAM POSITIVE COCCI IN CLUSTERS    Culture   Final    NO ANAEROBES ISOLATED Performed at [facility], [address], [place], [place] [address]    Report Status 09/28/2023 FINAL  Final  Culture, blood (Routine X 2) w Reflex to ID Panel     Status: None   Collection Time: 09/24/23  5:57 AM   Specimen: BLOOD RIGHT ARM  Result Value Ref Range Status   Specimen Description   Final    BLOOD RIGHT ARM Performed at [facility], [address] [place], [place] [address]    Special Requests   Final    BOTTLES DRAWN AEROBIC AND ANAEROBIC Blood Culture results may not be optimal due to an inadequate volume of blood received in culture bottles Performed at [facility], [address] [place], [place] [address]    Culture   Final    NO GROWTH 5 DAYS Performed at [facility], [address], [place], [place]  [address]    Report Status 09/29/2023 FINAL  Final  Culture, blood (Routine X 2) w Reflex to ID Panel     Status: None   Collection Time: 09/24/23  6:04 AM   Specimen: BLOOD RIGHT ARM  Result Value Ref Range Status   Specimen Description   Final    BLOOD RIGHT ARM Performed at [facility], [address], [place], [place] [address]    Special Requests   Final    BOTTLES DRAWN AEROBIC AND ANAEROBIC Blood Culture adequate volume Performed at [facility], [address], [place], [place] [address]    Culture   Final    NO GROWTH 5 DAYS Performed at [facility], [address] [place], [place] [address]    Report Status 09/29/2023 FINAL  Final  Aerobic/Anaerobic Culture w Gram Stain (surgical/deep wound)     Status: None (Preliminary result)   Collection Time: 09/27/23  3:05 PM   Specimen: Path Tissue  Result Value Ref Range Status   Specimen Description   Final    TISSUE RIGHT FEMORAL HEAD Performed at [facility], [address], [place], [place] [address]    Special Requests   Final    NONE Performed at [facility], [address] [place], [place] [address]    Gram Stain   Final    RARE WBC PRESENT,BOTH PMN AND MONONUCLEAR NO ORGANISMS SEEN    Culture   Final    NO GROWTH < 24 HOURS Performed at [facility], [address], [place], [place] [address]    Report Status PENDING  Incomplete    Antimicrobials/Microbiology: Anti-infectives (From admission, onward)    Start     Dose/Rate Route Frequency Ordered Stop   09/27/23 1819  vancomycin  (VANCOCIN ) powder  Status:  Discontinued          As needed 09/27/23 1820 09/27/23 1957   09/27/23 1707  tobramycin  (NEBCIN ) powder  Status:  Discontinued          As needed 09/27/23 1707 09/27/23 1957   09/27/23 1400  ceFAZolin  (ANCEF ) IVPB 2g/100 mL premix        2 g 200  mL/hr over 30 Minutes Intravenous On call to O.R. 09/27/23 1255 09/27/23 1637    09/23/23 1530  ceFAZolin  (ANCEF ) IVPB 2g/100 mL premix        2 g 200 mL/hr over 30 Minutes Intravenous Every 8 hours 09/23/23 1433     09/23/23 1130  cefTRIAXone  (ROCEPHIN ) 2 g in sodium chloride  0.9 % 100 mL IVPB  Status:  Discontinued        2 g 200 mL/hr over 30 Minutes Intravenous Daily 09/23/23 1112 09/23/23 1432   09/23/23 1115  vancomycin  (VANCOREADY) IVPB 1500 mg/300 mL  Status:  Discontinued        1,500 mg 150 mL/hr over 120 Minutes Intravenous  Once 09/23/23 1112 09/23/23 1432         Component Value Date/Time   SDES  09/27/2023 1505    TISSUE RIGHT FEMORAL HEAD Performed at [facility], [address], [place], [place] [address]    SPECREQUEST  09/27/2023 1505    NONE Performed at [facility], [address], [place], [place] [address]    CULT  09/27/2023 1505    NO GROWTH < 24 HOURS Performed at [facility], [address], [place], [place] [address]    REPTSTATUS PENDING 09/27/2023 1505     Radiology Studies: US  EKG SITE RITE Result Date: 09/29/2023 If Site Rite image not attached, placement could not be confirmed due to current cardiac rhythm.  DG Pelvis Portable Result Date: 09/27/2023 CLINICAL DATA:  Septic arthritis EXAM: PORTABLE PELVIS 1-2 VIEWS COMPARISON:  None Available. FINDINGS: Status post placement of antibiotic spacer at the right hip. Surgical drains and skin staples overlie the operative region. IMPRESSION: Status post placement of antibiotic spacer at the right hip. Electronically Signed   By: Franky Stanford M.D.   On: 09/27/2023 22:26   DG HIP UNILAT WITH PELVIS 1V RIGHT Result Date: 09/27/2023 CLINICAL DATA:  Right hip replacement EXAM: DG HIP (WITH OR WITHOUT PELVIS) 1V RIGHT COMPARISON:  None Available. FINDINGS: Nine fluoroscopic images demonstrate placement of a right hip arthroplasty device. IMPRESSION: Intraoperative fluoroscopy. Electronically Signed   By: Franky Stanford M.D.   On: 09/27/2023 22:17    DG C-Arm 1-60 Min-No Report Result Date: 09/27/2023 Fluoroscopy was utilized by the requesting physician.  No radiographic interpretation.   DG C-Arm 1-60 Min-No Report Result Date: 09/27/2023 Fluoroscopy was utilized by the requesting physician.  No radiographic interpretation.   DG C-Arm 1-60 Min-No Report Result Date: 09/27/2023 Fluoroscopy was utilized by the requesting physician.  No radiographic interpretation.     LOS: 7 days   Total time spent in review of labs and imaging, patient evaluation, formulation of plan, documentation and communication with family: 35 minutes  Mennie LAMY, MD  [facility]  09/29/2023, 10:22 AM

## 2023-09-29 NOTE — Progress Notes (Signed)
 Subjective:  Patient reports pain as mild to moderate.  Denies N/V/CP/SOB/Abd pain. She denies any tingling or numbness in LE bilaterally. Mild pain reported.   Objective:   VITALS:   Vitals:   09/28/23 2349 09/29/23 0023 09/29/23 0258 09/29/23 0513  BP: 107/70 108/67 113/71 109/75  Pulse: 96 93 76 78  Resp: 20 20 18 15   Temp: 98.7 F (37.1 C) 98.5 F (36.9 C) 99.3 F (37.4 C) 98 F (36.7 C)  TempSrc: Oral Axillary Oral Oral  SpO2: 97% 97% 98% 98%  Weight:      Height:       NAD Neurologically intact ABD soft Neurovascular intact Sensation intact distally Intact pulses distally Dorsiflexion/Plantar flexion intact Incision: dressing C/D/I No cellulitis present Compartment soft  Lab Results  Component Value Date   WBC 11.5 (H) 09/29/2023   HGB 6.4 (LL) 09/29/2023   HCT 20.4 (L) 09/29/2023   MCV 94.9 09/29/2023   PLT 355 09/29/2023   BMET    Component Value Date/Time   NA 128 (L) 09/29/2023 0540   NA 142 11/30/2021 0000   K 3.0 (L) 09/29/2023 0540   CL 88 (L) 09/29/2023 0540   CO2 23 09/29/2023 0540   GLUCOSE 569 (HH) 09/29/2023 0540   BUN 11 09/29/2023 0540   BUN 13 11/30/2021 0000   CREATININE 1.51 (H) 09/29/2023 0540   CALCIUM 7.7 (L) 09/29/2023 0540   EGFR 80 11/30/2021 0000   GFRNONAA 40 (L) 09/29/2023 0540   Results for orders placed or performed during the hospital encounter of 09/22/23  Blood culture (routine x 2)     Status: Abnormal   Collection Time: 09/22/23  4:50 PM   Specimen: BLOOD  Result Value Ref Range Status   Specimen Description   Final    BLOOD LEFT ANTECUBITAL Performed at [facility], [address], [place], [place] [address]    Special Requests   Final    BOTTLES DRAWN AEROBIC AND ANAEROBIC Blood Culture adequate volume Performed at [facility], [address] [place], [place] [address]    Culture  Setup Time   Final    GRAM POSITIVE COCCI IN CLUSTERS IN BOTH AEROBIC AND  ANAEROBIC BOTTLES CRITICAL RESULT CALLED TO, READ BACK BY AND VERIFIED WITH: MAYA SHUCK SWAYNE [phone] AT 1134 BY EC Performed at [facility], [address] [place], [place] [address]    Culture STAPHYLOCOCCUS AUREUS (A)  Final   Report Status 09/25/2023 FINAL  Final   Organism ID, Bacteria STAPHYLOCOCCUS AUREUS  Final      Susceptibility   Staphylococcus aureus - MIC*    CIPROFLOXACIN <=0.5 SENSITIVE Sensitive     ERYTHROMYCIN <=0.25 SENSITIVE Sensitive     GENTAMICIN <=0.5 SENSITIVE Sensitive     OXACILLIN 0.5 SENSITIVE Sensitive     TETRACYCLINE <=1 SENSITIVE Sensitive     VANCOMYCIN  <=0.5 SENSITIVE Sensitive     TRIMETH/SULFA <=10 SENSITIVE Sensitive     CLINDAMYCIN <=0.25 SENSITIVE Sensitive     RIFAMPIN <=0.5 SENSITIVE Sensitive     Inducible Clindamycin NEGATIVE Sensitive     LINEZOLID 2 SENSITIVE Sensitive     * STAPHYLOCOCCUS AUREUS  Blood Culture ID Panel (Reflexed)     Status: Abnormal   Collection Time: 09/22/23  4:50 PM  Result Value Ref Range Status   Enterococcus faecalis NOT DETECTED NOT DETECTED Final   Enterococcus Faecium NOT DETECTED NOT DETECTED Final   Listeria monocytogenes NOT DETECTED NOT DETECTED Final  Staphylococcus species DETECTED (A) NOT DETECTED Final    Comment: CRITICAL RESULT CALLED TO, READ BACK BY AND VERIFIED WITH: PHARMD MARY SWAYNE [phone] AT 1134 BY EC    Staphylococcus aureus (BCID) DETECTED (A) NOT DETECTED Final    Comment: CRITICAL RESULT CALLED TO, READ BACK BY AND VERIFIED WITH: PHARMD MARY SWAYNE [id number] AT 1134 BY EC    Staphylococcus epidermidis NOT DETECTED NOT DETECTED Final   Staphylococcus lugdunensis NOT DETECTED NOT DETECTED Final   Streptococcus species NOT DETECTED NOT DETECTED Final   Streptococcus agalactiae NOT DETECTED NOT DETECTED Final   Streptococcus pneumoniae NOT DETECTED NOT DETECTED Final   Streptococcus pyogenes NOT DETECTED NOT DETECTED Final   A.calcoaceticus-baumannii NOT DETECTED NOT  DETECTED Final   Bacteroides fragilis NOT DETECTED NOT DETECTED Final   Enterobacterales NOT DETECTED NOT DETECTED Final   Enterobacter cloacae complex NOT DETECTED NOT DETECTED Final   Escherichia coli NOT DETECTED NOT DETECTED Final   Klebsiella aerogenes NOT DETECTED NOT DETECTED Final   Klebsiella oxytoca NOT DETECTED NOT DETECTED Final   Klebsiella pneumoniae NOT DETECTED NOT DETECTED Final   Proteus species NOT DETECTED NOT DETECTED Final   Salmonella species NOT DETECTED NOT DETECTED Final   Serratia marcescens NOT DETECTED NOT DETECTED Final   Haemophilus influenzae NOT DETECTED NOT DETECTED Final   Neisseria meningitidis NOT DETECTED NOT DETECTED Final   Pseudomonas aeruginosa NOT DETECTED NOT DETECTED Final   Stenotrophomonas maltophilia NOT DETECTED NOT DETECTED Final   Candida albicans NOT DETECTED NOT DETECTED Final   Candida auris NOT DETECTED NOT DETECTED Final   Candida glabrata NOT DETECTED NOT DETECTED Final   Candida krusei NOT DETECTED NOT DETECTED Final   Candida parapsilosis NOT DETECTED NOT DETECTED Final   Candida tropicalis NOT DETECTED NOT DETECTED Final   Cryptococcus neoformans/gattii NOT DETECTED NOT DETECTED Final   Meth resistant mecA/C and MREJ NOT DETECTED NOT DETECTED Final    Comment: Performed at [facility], [address] [place], [place] [address]  Blood culture (routine x 2)     Status: Abnormal   Collection Time: 09/22/23  5:07 PM   Specimen: BLOOD  Result Value Ref Range Status   Specimen Description   Final    BLOOD RIGHT ANTECUBITAL Performed at [facility], [address] [place], [place] [address]    Special Requests   Final    BOTTLES DRAWN AEROBIC AND ANAEROBIC Blood Culture adequate volume Performed at [facility], [address], [place], [place] [address]    Culture  Setup Time   Final    GRAM POSITIVE COCCI IN CLUSTERS IN BOTH AEROBIC AND ANAEROBIC BOTTLES CRITICAL VALUE NOTED.   VALUE IS CONSISTENT WITH PREVIOUSLY REPORTED AND CALLED VALUE.    Culture (A)  Final    STAPHYLOCOCCUS AUREUS SUSCEPTIBILITIES PERFORMED ON PREVIOUS CULTURE WITHIN THE LAST 5 DAYS. Performed at [facility], [address] [place], [place] [address]    Report Status 09/25/2023 FINAL  Final  Anaerobic culture w Gram Stain     Status: None   Collection Time: 09/23/23 10:19 AM   Specimen: Joint, Right Hip; Synovial Fluid  Result Value Ref Range Status   Specimen Description   Final    SYNOVIAL Performed at [facility], [address], [place], [place] [address]    Special Requests   Final    NONE RIGHT HIP Performed at [facility], [address] [place], [place] [address]  Gram Stain   Final    MODERATE WBC PRESENT,BOTH PMN AND MONONUCLEAR FEW GRAM POSITIVE COCCI IN CLUSTERS    Culture   Final    NO ANAEROBES ISOLATED Performed at [facility], [address], [place], [place] [address]    Report Status 09/28/2023 FINAL  Final  Culture, blood (Routine X 2) w Reflex to ID Panel     Status: None   Collection Time: 09/24/23  5:57 AM   Specimen: BLOOD RIGHT ARM  Result Value Ref Range Status   Specimen Description   Final    BLOOD RIGHT ARM Performed at [facility], [address], [place], [place] [address]    Special Requests   Final    BOTTLES DRAWN AEROBIC AND ANAEROBIC Blood Culture results may not be optimal due to an inadequate volume of blood received in culture bottles Performed at [facility], [address], [place], [place] [address]    Culture   Final    NO GROWTH 5 DAYS Performed at [facility], [address] [place], [place] [address]    Report Status 09/29/2023 FINAL  Final  Culture, blood (Routine X 2) w Reflex to ID Panel     Status: None   Collection Time: 09/24/23  6:04 AM   Specimen: BLOOD RIGHT ARM  Result Value Ref Range Status   Specimen Description   Final     BLOOD RIGHT ARM Performed at [facility], [address], [place], [place] [address]    Special Requests   Final    BOTTLES DRAWN AEROBIC AND ANAEROBIC Blood Culture adequate volume Performed at [facility], [address] [place], [place] [address]    Culture   Final    NO GROWTH 5 DAYS Performed at [facility], [address] [place], [place] [address]    Report Status 09/29/2023 FINAL  Final  Aerobic/Anaerobic Culture w Gram Stain (surgical/deep wound)     Status: None (Preliminary result)   Collection Time: 09/27/23  3:05 PM   Specimen: Path Tissue  Result Value Ref Range Status   Specimen Description   Final    TISSUE RIGHT FEMORAL HEAD Performed at [facility], [address] [place], [place] [address]    Special Requests   Final    NONE Performed at [facility], [address] [place], [place] [address]    Gram Stain   Final    RARE WBC PRESENT,BOTH PMN AND MONONUCLEAR NO ORGANISMS SEEN    Culture   Final    NO GROWTH < 24 HOURS Performed at [facility], [address], [place], [place] [address]    Report Status PENDING  Incomplete      Assessment/Plan: 2 Days Post-Op   Principal Problem:   Septic arthritis (HCC) Active Problems:   Hypothyroidism (acquired)   Pain   MSSA bacteremia   Left hip pain   Right hip pain   Encounter for hepatitis C screening test for low risk patient   Acute osteomyelitis involving pelvic region and thigh, right (HCC)   Osteomyelitis of right femur (HCC)    TDWB RLE with walker DVT ppx: Aspirin , SCDs, TEDS PO pain control Dispo:  - Abnormal labs this morning, being redrawn.  - Patient under care of the medical team, disposition per their recommendation. Looks like plans for d/c home with PICC line and HHRN pending results of intraoperative  cultures. PT to come by today. Continue to ice RLE as needed for pain and swelling.      Melanie Li Potters 09/29/2023, 8:23 AM   [facility]  [place] [address], [address], [place], [place] [address] Phone: [phone] [email or web address] Facebook  [employer]

## 2023-09-29 NOTE — Progress Notes (Addendum)
 Subjective:   No new complaints she was relieved to have had surgery  Antibiotics:  Anti-infectives (From admission, onward)    Start     Dose/Rate Route Frequency Ordered Stop   09/27/23 1819  vancomycin  (VANCOCIN ) powder  Status:  Discontinued          As needed 09/27/23 1820 09/27/23 1957   09/27/23 1707  tobramycin  (NEBCIN ) powder  Status:  Discontinued          As needed 09/27/23 1707 09/27/23 1957   09/27/23 1400  ceFAZolin  (ANCEF ) IVPB 2g/100 mL premix        2 g 200 mL/hr over 30 Minutes Intravenous On call to O.R. 09/27/23 1255 09/27/23 1637   09/23/23 1530  ceFAZolin  (ANCEF ) IVPB 2g/100 mL premix        2 g 200 mL/hr over 30 Minutes Intravenous Every 8 hours 09/23/23 1433     09/23/23 1130  cefTRIAXone  (ROCEPHIN ) 2 g in sodium chloride  0.9 % 100 mL IVPB  Status:  Discontinued        2 g 200 mL/hr over 30 Minutes Intravenous Daily 09/23/23 1112 09/23/23 1432   09/23/23 1115  vancomycin  (VANCOREADY) IVPB 1500 mg/300 mL  Status:  Discontinued        1,500 mg 150 mL/hr over 120 Minutes Intravenous  Once 09/23/23 1112 09/23/23 1432       Medications: Scheduled Meds:  sodium chloride    Intravenous Once   aspirin   81 mg Oral BID   docusate sodium   100 mg Oral BID   Ferrous Fumarate   1 tablet Oral Daily   gabapentin   300 mg Oral QHS   levothyroxine   100 mcg Oral QAC breakfast   senna-docusate  2 tablet Oral BID   sodium chloride  flush  3 mL Intravenous Q12H   Continuous Infusions:   ceFAZolin  (ANCEF ) IV 2 g (09/29/23 0529)   PRN Meds:.acetaminophen , bisacodyl , diphenhydrAMINE , HYDROmorphone  (DILAUDID ) injection, menthol -cetylpyridinium **OR** phenol, methocarbamol  **OR** methocarbamol  (ROBAXIN ) injection, metoCLOPramide  **OR** metoCLOPramide  (REGLAN ) injection, morphine , ondansetron  **OR** ondansetron  (ZOFRAN ) IV, polyethylene glycol    Objective: Weight change:   Intake/Output Summary (Last 24 hours) at 09/29/2023 1040 Last data filed at 09/29/2023  0258 Gross per 24 hour  Intake 1473.67 ml  Output --  Net 1473.67 ml   Blood pressure 109/75, pulse 78, temperature 98 F (36.7 C), temperature source Oral, resp. rate 15, height 5' 8 (1.727 m), weight 70 kg, SpO2 98%. Temp:  [98 F (36.7 C)-99.3 F (37.4 C)] 98 F (36.7 C) (02/07 0513) Pulse Rate:  [76-99] 78 (02/07 0513) Resp:  [15-20] 15 (02/07 0513) BP: (106-116)/(67-77) 109/75 (02/07 0513) SpO2:  [88 %-99 %] 98 % (02/07 0513)  Physical Exam: Physical Exam Constitutional:      General: She is not in acute distress.    Appearance: She is well-developed. She is not diaphoretic.  HENT:     Head: Normocephalic and atraumatic.     Right Ear: External ear normal.     Left Ear: External ear normal.     Mouth/Throat:     Pharynx: No oropharyngeal exudate.  Eyes:     General: No scleral icterus.    Conjunctiva/sclera: Conjunctivae normal.     Pupils: Pupils are equal, round, and reactive to light.  Cardiovascular:     Rate and Rhythm: Normal rate and regular rhythm.  Pulmonary:     Effort: Pulmonary effort is normal. No respiratory distress.     Breath sounds: No wheezing.  Abdominal:     General: There is no distension.     Palpations: Abdomen is soft.  Lymphadenopathy:     Cervical: No cervical adenopathy.  Skin:    General: Skin is warm and dry.     Coloration: Skin is not pale.     Findings: No erythema or rash.  Neurological:     General: No focal deficit present.     Mental Status: She is alert and oriented to person, place, and time.     Motor: No abnormal muscle tone.     Coordination: Coordination normal.  Psychiatric:        Mood and Affect: Mood normal.        Behavior: Behavior normal.        Thought Content: Thought content normal.        Judgment: Judgment normal.     Right side bandaged  CBC:    BMET Recent Labs    09/29/23 0540 09/29/23 0800  NA 128* 138  K 3.0* 3.4*  CL 88* 103  CO2 23 26  GLUCOSE 569* 95  BUN 11 12  CREATININE  1.51* 0.66  CALCIUM 7.7* 9.0     Liver Panel  No results for input(s): PROT, ALBUMIN , AST, ALT, ALKPHOS, BILITOT, BILIDIR, IBILI in the last 72 hours.     Sedimentation Rate No results for input(s): ESRSEDRATE in the last 72 hours.  C-Reactive Protein No results for input(s): CRP in the last 72 hours.   Micro Results: Recent Results (from the past 720 hours)  Blood culture (routine x 2)     Status: Abnormal   Collection Time: 09/22/23  4:50 PM   Specimen: BLOOD  Result Value Ref Range Status   Specimen Description   Final    BLOOD LEFT ANTECUBITAL Performed at [facility], [address] [place], [place] [address]    Special Requests   Final    BOTTLES DRAWN AEROBIC AND ANAEROBIC Blood Culture adequate volume Performed at [facility], [address] [place], [place] [address]    Culture  Setup Time   Final    GRAM POSITIVE COCCI IN CLUSTERS IN BOTH AEROBIC AND ANAEROBIC BOTTLES CRITICAL RESULT CALLED TO, READ BACK BY AND VERIFIED WITH: MAYA SHUCK SWAYNE [phone] AT 1134 BY EC Performed at [facility], [address], [place], [place] [address]    Culture STAPHYLOCOCCUS AUREUS (A)  Final   Report Status 09/25/2023 FINAL  Final   Organism ID, Bacteria STAPHYLOCOCCUS AUREUS  Final      Susceptibility   Staphylococcus aureus - MIC*    CIPROFLOXACIN <=0.5 SENSITIVE Sensitive     ERYTHROMYCIN <=0.25 SENSITIVE Sensitive     GENTAMICIN <=0.5 SENSITIVE Sensitive     OXACILLIN 0.5 SENSITIVE Sensitive     TETRACYCLINE <=1 SENSITIVE Sensitive     VANCOMYCIN  <=0.5 SENSITIVE Sensitive     TRIMETH/SULFA <=10 SENSITIVE Sensitive     CLINDAMYCIN <=0.25 SENSITIVE Sensitive     RIFAMPIN <=0.5 SENSITIVE Sensitive     Inducible Clindamycin NEGATIVE Sensitive     LINEZOLID 2 SENSITIVE Sensitive     * STAPHYLOCOCCUS AUREUS  Blood Culture ID Panel (Reflexed)     Status: Abnormal   Collection Time: 09/22/23  4:50 PM   Result Value Ref Range Status   Enterococcus faecalis NOT DETECTED NOT DETECTED Final   Enterococcus Faecium NOT DETECTED NOT DETECTED Final   Listeria monocytogenes NOT DETECTED NOT DETECTED Final   Staphylococcus species DETECTED (  A) NOT DETECTED Final    Comment: CRITICAL RESULT CALLED TO, READ BACK BY AND VERIFIED WITH: PHARMD MARY SWAYNE [phone] AT 1134 BY EC    Staphylococcus aureus (BCID) DETECTED (A) NOT DETECTED Final    Comment: CRITICAL RESULT CALLED TO, READ BACK BY AND VERIFIED WITH: PHARMD MARY SWAYNE [phone] AT 1134 BY EC    Staphylococcus epidermidis NOT DETECTED NOT DETECTED Final   Staphylococcus lugdunensis NOT DETECTED NOT DETECTED Final   Streptococcus species NOT DETECTED NOT DETECTED Final   Streptococcus agalactiae NOT DETECTED NOT DETECTED Final   Streptococcus pneumoniae NOT DETECTED NOT DETECTED Final   Streptococcus pyogenes NOT DETECTED NOT DETECTED Final   A.calcoaceticus-baumannii NOT DETECTED NOT DETECTED Final   Bacteroides fragilis NOT DETECTED NOT DETECTED Final   Enterobacterales NOT DETECTED NOT DETECTED Final   Enterobacter cloacae complex NOT DETECTED NOT DETECTED Final   Escherichia coli NOT DETECTED NOT DETECTED Final   Klebsiella aerogenes NOT DETECTED NOT DETECTED Final   Klebsiella oxytoca NOT DETECTED NOT DETECTED Final   Klebsiella pneumoniae NOT DETECTED NOT DETECTED Final   Proteus species NOT DETECTED NOT DETECTED Final   Salmonella species NOT DETECTED NOT DETECTED Final   Serratia marcescens NOT DETECTED NOT DETECTED Final   Haemophilus influenzae NOT DETECTED NOT DETECTED Final   Neisseria meningitidis NOT DETECTED NOT DETECTED Final   Pseudomonas aeruginosa NOT DETECTED NOT DETECTED Final   Stenotrophomonas maltophilia NOT DETECTED NOT DETECTED Final   Candida albicans NOT DETECTED NOT DETECTED Final   Candida auris NOT DETECTED NOT DETECTED Final   Candida glabrata NOT DETECTED NOT DETECTED Final   Candida krusei NOT  DETECTED NOT DETECTED Final   Candida parapsilosis NOT DETECTED NOT DETECTED Final   Candida tropicalis NOT DETECTED NOT DETECTED Final   Cryptococcus neoformans/gattii NOT DETECTED NOT DETECTED Final   Meth resistant mecA/C and MREJ NOT DETECTED NOT DETECTED Final    Comment: Performed at [facility], [address], [place], [place] [address]  Blood culture (routine x 2)     Status: Abnormal   Collection Time: 09/22/23  5:07 PM   Specimen: BLOOD  Result Value Ref Range Status   Specimen Description   Final    BLOOD RIGHT ANTECUBITAL Performed at [facility], [address] [place], [place] [address]    Special Requests   Final    BOTTLES DRAWN AEROBIC AND ANAEROBIC Blood Culture adequate volume Performed at [facility], [address], [place], [place] [address]    Culture  Setup Time   Final    GRAM POSITIVE COCCI IN CLUSTERS IN BOTH AEROBIC AND ANAEROBIC BOTTLES CRITICAL VALUE NOTED.  VALUE IS CONSISTENT WITH PREVIOUSLY REPORTED AND CALLED VALUE.    Culture (A)  Final    STAPHYLOCOCCUS AUREUS SUSCEPTIBILITIES PERFORMED ON PREVIOUS CULTURE WITHIN THE LAST 5 DAYS. Performed at [facility], [address] [place], [place] [address]    Report Status 09/25/2023 FINAL  Final  Anaerobic culture w Gram Stain     Status: None   Collection Time: 09/23/23 10:19 AM   Specimen: Joint, Right Hip; Synovial Fluid  Result Value Ref Range Status   Specimen Description   Final    SYNOVIAL Performed at [facility], [address], [place], [place] [address]    Special Requests   Final    NONE RIGHT HIP Performed at [facility], [address], [place], [place] [address]    Gram Stain  Final    MODERATE WBC PRESENT,BOTH PMN AND MONONUCLEAR FEW GRAM POSITIVE COCCI IN CLUSTERS    Culture   Final    NO ANAEROBES ISOLATED Performed at [facility], [address], [place], [place]  [address]    Report Status 09/28/2023 FINAL  Final  Culture, blood (Routine X 2) w Reflex to ID Panel     Status: None   Collection Time: 09/24/23  5:57 AM   Specimen: BLOOD RIGHT ARM  Result Value Ref Range Status   Specimen Description   Final    BLOOD RIGHT ARM Performed at [facility], [address], [place], [place] [address]    Special Requests   Final    BOTTLES DRAWN AEROBIC AND ANAEROBIC Blood Culture results may not be optimal due to an inadequate volume of blood received in culture bottles Performed at [facility], [address], [place], [place] [address]    Culture   Final    NO GROWTH 5 DAYS Performed at [facility], [address], [place], [place] [address]    Report Status 09/29/2023 FINAL  Final  Culture, blood (Routine X 2) w Reflex to ID Panel     Status: None   Collection Time: 09/24/23  6:04 AM   Specimen: BLOOD RIGHT ARM  Result Value Ref Range Status   Specimen Description   Final    BLOOD RIGHT ARM Performed at [facility], [address] [place], [place] [address]    Special Requests   Final    BOTTLES DRAWN AEROBIC AND ANAEROBIC Blood Culture adequate volume Performed at [facility], [address], [place], [place] [address]    Culture   Final    NO GROWTH 5 DAYS Performed at [facility], [address] [place], [place] [address]    Report Status 09/29/2023 FINAL  Final  Aerobic/Anaerobic Culture w Gram Stain (surgical/deep wound)     Status: None (Preliminary result)   Collection Time: 09/27/23  3:05 PM   Specimen: Path Tissue  Result Value Ref Range Status   Specimen Description   Final    TISSUE RIGHT FEMORAL HEAD Performed at [facility], [address] [place], [place] [address]    Special Requests   Final    NONE Performed at [facility], [address] [place], [place] [address]    Gram Stain   Final    RARE WBC PRESENT,BOTH  PMN AND MONONUCLEAR NO ORGANISMS SEEN    Culture   Final    NO GROWTH < 24 HOURS Performed at [facility], [address] [place], [place] [address]    Report Status PENDING  Incomplete    Studies/Results: US  EKG SITE RITE Result Date: 09/29/2023 If Site Rite image not attached, placement could not be confirmed due to current cardiac rhythm.  DG Pelvis Portable Result Date: 09/27/2023 CLINICAL DATA:  Septic arthritis EXAM: PORTABLE PELVIS 1-2 VIEWS COMPARISON:  None Available. FINDINGS: Status post placement of antibiotic spacer at the right hip. Surgical drains and skin staples overlie the operative region. IMPRESSION: Status post placement of antibiotic spacer at the right hip. Electronically Signed   By: Franky Stanford M.D.   On: 09/27/2023 22:26   DG HIP UNILAT WITH PELVIS 1V RIGHT Result Date: 09/27/2023 CLINICAL DATA:  Right hip replacement EXAM: DG HIP (WITH OR WITHOUT PELVIS) 1V RIGHT COMPARISON:  None Available. FINDINGS: Nine  fluoroscopic images demonstrate placement of a right hip arthroplasty device. IMPRESSION: Intraoperative fluoroscopy. Electronically Signed   By: Franky Stanford M.D.   On: 09/27/2023 22:17   DG C-Arm 1-60 Min-No Report Result Date: 09/27/2023 Fluoroscopy was utilized by the requesting physician.  No radiographic interpretation.   DG C-Arm 1-60 Min-No Report Result Date: 09/27/2023 Fluoroscopy was utilized by the requesting physician.  No radiographic interpretation.   DG C-Arm 1-60 Min-No Report Result Date: 09/27/2023 Fluoroscopy was utilized by the requesting physician.  No radiographic interpretation.      Assessment/Plan:  INTERVAL HISTORY: cultures from OR are unrevealing so far Principal Problem:   Septic arthritis (HCC) Active Problems:   Hypothyroidism (acquired)   Pain   MSSA bacteremia   Left hip pain   Right hip pain   Encounter for hepatitis C screening test for low risk patient   Acute osteomyelitis involving pelvic region and  thigh, right (HCC)   Osteomyelitis of right femur (HCC)    Melanie Li is a 58 y.o. female with with history of hypothyroidism who underwent corticosteroid injection after a injury while playing pickle ball.   Ultimately after some initial benefit from the injection she had worsening pain which made it difficult for her to even ambulate.   She had workup done for radicular pain with MRI of the spine which was unrevealing and then an MRI of the hip that showed of septic arthritis with a large joint fluid collection as well as destruction of cartilage and bone having been seen.   Was sent to the emergency department by Dr. Fidel due to concerns about her septic hip and desire to have this aspirated and have antibiotics started.  Blood cultures taken on admission have subsequent yielded MSSA aspirate of the joint shows gram-positive cocci in clusters which is undoubtedly the same MSSA she is scheduled for surgery later this week on Wednesday   2D echocardiogram does not show evidence of endocarditis.   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:  Wednesday the  patient underwent right Girdlestone procedure with the right femoral head having been resected and sent for aerobic and anaerobic tissue culture, The acetabulum also was found to have severe erosive changes.  There are areas where the cartilage of the entire acetabulum was delaminated with an abscess involving the retroacetabular bone medially sp sequentila reaming of the acetabulum, apparently there was an abscess cavity that tracked all the way to the ischium.  Antibiotic cement was used to reduce the hip and there are plans for THA eventually  I will continue her cefazolin .  Her repeat blood cultures are no growth at 5 days.  I am ordering a PICC line.  I will plan on giving her 6-week course of IV antibiotics followed by oral antibiotics given the  osteomyelitis  We will follow-up the culture data from the operating room.   Diagnosis: Osteo of pelvis septic hip, bacteremia  Culture Result: MSSA  Allergies  Allergen Reactions   Sulfa Antibiotics Hives, Rash and Other (See Comments)    Rash covered the entire body   Oxycodone  Other (See Comments)    Very INEFFECTIVE   Conjugated Estrogens Rash    OPAT Orders Discharge antibiotics to be given via PICC line Discharge antibiotics: Cefazolin  2 grams IV q 8 hours  Duration: 6 weeks postop End Date: 11/07/2023  [facility] Care Per Protocol:  Home health RN for IV administration and  teaching; PICC line care and labs.    Labs weekly while on IV antibiotics: _x_ CBC with differential _x_ BMP __ CMP _x_ CRP x__ ESR __ Vancomycin  trough __ CK  _x_ Please pull PIC at completion of IV antibiotics __ Please leave PIC in place until doctor has seen patient or been notified  Fax weekly labs to [phone]  Clinic Follow Up Appt:   Reesa Gotschall has an appointment on 10/24/2023 at 945 AM with Dr. Fleeta Rothman at  [facility], which  is located in the [facility] at  [address] in [place].  [address], which is located to the left of the elevators.  Phone: [phone]  Fax: [phone]  [email or web address]  The patient should arrive 30 minutes prior to their appoitment.    I have personally spent 50 minutes involved in face-to-face and non-face-to-face activities for this patient on the day of the visit. Professional time spent includes the following activities: Preparing to see the patient (review of tests), Obtaining and/or reviewing separately obtained history (admission/discharge record), Performing a medically appropriate examination and/or evaluation , Ordering medications/tests/procedures, referring and communicating with other health care professionals, Documenting clinical  information in the EMR, Independently interpreting results (not separately reported), Communicating results to the patient/family/caregiver, Counseling and educating the patient/family/caregiver and Care coordination (not separately reported).   Evaluation of the patient requires complex antimicrobial therapy evaluation, counseling , isolation needs to reduce disease transmission and risk assessment and mitigation.   Dr. Efrain will follow-up the culture data from the operating room this weekend and Dr. Dennise will be here on Monday.    Melanie Li 09/29/2023, 10:40 AM

## 2023-09-29 NOTE — Progress Notes (Signed)
   09/29/23 9260  Provider Notification  Provider Name/Title Christobal Guadalajara MD  Date Provider Notified 09/29/23  Time Provider Notified 0740  Method of Notification Page  Notification Reason Critical Result (Blood glucose of 569)  Provider response See new orders  Date of Provider Response 09/29/23  Time of Provider Response [phone]

## 2023-09-29 NOTE — Progress Notes (Signed)
   09/29/23 0654  Provider Notification  Provider Name/Title Lavanda Horns, NP  Date Provider Notified 09/29/23  Time Provider Notified [phone]  Method of Notification Page [place])  Notification Reason Critical Result (Hgb 6.4)  Provider response No new orders  Date of Provider Response 09/29/23  Time of Provider Response [phone]   Will report off to oncoming nurse.

## 2023-09-29 NOTE — Progress Notes (Signed)
 Spoke with Primary RN via secure chat, PICC will be done tomorrow 2/8. Patient has 2 working PIVs.

## 2023-09-29 NOTE — Progress Notes (Deleted)
   09/29/23 9260  Provider Notification  Provider Name/Title Christobal Guadalajara MD  Date Provider Notified 09/29/23  Time Provider Notified 0740  Method of Notification Page  Notification Reason Critical Result (Blood glucose of 569)  Provider response Other (Comment) (awaiting response.)  Date of Provider Response 09/29/23

## 2023-09-29 NOTE — Evaluation (Signed)
 Physical Therapy Evaluation Patient Details Name: Melanie Li MRN: [id number] DOB: 1965/09/17 Today's Date: 09/29/2023  History of Present Illness  58 yo female presents to therapy following hospital admission in 09/22/2023 for girdlestone procedure, debridement and placement of articulating ABX spacer in R hip on 09/27/2023 secondary to septic arthritis with culture positive for MRSA. Pt is currently R LE TDWB with RW.  Pt hospitalization complicated due to low HgB requiring transfusion 2/6.Pt PMH includes but si not limited to: tachycardia and thyroid  dz.  Clinical Impression   Melanie Li is a 58 y.o. female POD 2 s/p Girdlestone procedure, anterior approach and placement of articulating ABX spacer. Patient reports prior to cortisone injection pt with IND, however due to pain following injection pt was requiring A from spouse for IADLs, ADLs and transfer tasks with mobility at baseline. Patient is now limited by functional impairments (see PT problem list below) and requires min A for supine to sit and mod A for sit to supine for bed mobility and min A for transfers with RW x 2 trials. Patient was unable to safely ambulate due to symptomatic orthostatic hypotension.  Patient instructed in exercise to facilitate ROM and circulation to manage edema. Pt left in bed, all needs in place and nurse aware of Bp findings.  Patient will benefit from continued skilled PT interventions to address impairments and progress towards PLOF. Acute PT will follow to progress mobility and stair training in preparation for safe discharge home with family support and [facility] services. Bp seated EOB s/p 1st standing trial with reports of light headedness 99/71 (90) Bp immediate standing 75/62 (124 PR) pt able to tolerate 1:09 standing Bp in semi trendelenburg position in bed 116/74 (81 PR) Bp in upright sitting in bed 114/74 (81 PR)       If plan is discharge home, recommend the following: A little help with walking  and/or transfers;A little help with bathing/dressing/bathroom;Assistance with cooking/housework;Assist for transportation;Help with stairs or ramp for entrance   Can travel by private vehicle        Equipment Recommendations Rolling walker (2 wheels);BSC/3in1  Recommendations for Other Services       Functional Status Assessment Patient has had a recent decline in their functional status and demonstrates the ability to make significant improvements in function in a reasonable and predictable amount of time.     Precautions / Restrictions Precautions Precautions: Fall Restrictions Weight Bearing Restrictions Per Provider Order: Yes RLE Weight Bearing Per Provider Order: Touchdown weight bearing      Mobility  Bed Mobility Overal bed mobility: Needs Assistance Bed Mobility: Supine to Sit, Sit to Supine     Supine to sit: HOB elevated, Used rails, Min assist Sit to supine: Mod assist   General bed mobility comments: pt required A for R LE in/out of bed    Transfers Overall transfer level: Needs assistance Equipment used: Rolling walker (2 wheels) Transfers: Sit to/from Stand Sit to Stand: Min assist           General transfer comment: PTs foot under pts R foot to provide cues and insight to maintain R LE TDWB, pt indicated feeling light headed with sit to stand, returned to EOB and subsequent sit to stand to obtain Bp    Ambulation/Gait               General Gait Details: NT due to soft Bp  Stairs            Wheelchair Mobility  Tilt Bed    Modified Rankin (Stroke Patients Only)       Balance Overall balance assessment: Mild deficits observed, not formally tested                                           Pertinent Vitals/Pain Pain Assessment Pain Assessment: 0-10 Pain Score: 2  (2/10 at rest and with mobility) Pain Location: R LE and hip Pain Descriptors / Indicators: Constant, Discomfort, Dull, Grimacing, Operative  site guarding Pain Intervention(s): Limited activity within patient's tolerance, Monitored during session, Premedicated before session, Repositioned, Ice applied    Home Living Family/patient expects to be discharged to:: Private residence Living Arrangements: Spouse/significant other Available Help at Discharge: Family Type of Home: House Home Access: Stairs to enter Entrance Stairs-Rails: None Entrance Stairs-Number of Steps: 1   Home Layout: Multi-level;Able to live on main level with bedroom/bathroom Home Equipment: Other (comment) (rented transport chair)      Prior Function Prior Level of Function : Needs assist       Physical Assist : Mobility (physical);ADLs (physical) Mobility (physical): Transfers;Stairs ADLs (physical): IADLs;Dressing;Bathing (lower body dressing)   ADLs Comments: IND prior to cortisone injection no AD with all ADLs, self care tasks and IADLs     Extremity/Trunk Assessment        Lower Extremity Assessment Lower Extremity Assessment:  (R LE NT: LLE WFL)    Cervical / Trunk Assessment Cervical / Trunk Assessment: Normal  Communication   Communication Communication: No apparent difficulties  Cognition Arousal: Alert Behavior During Therapy: WFL for tasks assessed/performed Overall Cognitive Status: Within Functional Limits for tasks assessed                                          General Comments      Exercises Total Joint Exercises Ankle Circles/Pumps: PROM, Both, 10 reps   Assessment/Plan    PT Assessment Patient needs continued PT services  PT Problem List Decreased strength;Decreased range of motion;Decreased activity tolerance;Decreased balance;Decreased mobility;Decreased coordination;Pain       PT Treatment Interventions DME instruction;Gait training;Stair training;Functional mobility training;Therapeutic activities;Therapeutic exercise;Balance training;Neuromuscular re-education;Patient/family  education;Modalities    PT Goals (Current goals can be found in the Care Plan section)  Acute Rehab PT Goals Patient Stated Goal: to be able to do regular life again and walk normally PT Goal Formulation: With patient Time For Goal Achievement: 10/13/23 Potential to Achieve Goals: Good    Frequency 7X/week     Co-evaluation               AM-PAC PT 6 Clicks Mobility  Outcome Measure Help needed turning from your back to your side while in a flat bed without using bedrails?: A Little Help needed moving from lying on your back to sitting on the side of a flat bed without using bedrails?: A Little Help needed moving to and from a bed to a chair (including a wheelchair)?: A Little Help needed standing up from a chair using your arms (e.g., wheelchair or bedside chair)?: Total Help needed to walk in hospital room?: Total Help needed climbing 3-5 steps with a railing? : Total 6 Click Score: 12    End of Session Equipment Utilized During Treatment: Gait belt Activity Tolerance: Patient limited by fatigue;Treatment limited secondary to  medical complications (Comment) (BP) Patient left: in bed;with call bell/phone within reach Nurse Communication: Mobility status;Weight bearing status PT Visit Diagnosis: Unsteadiness on feet (R26.81);Other abnormalities of gait and mobility (R26.89);Muscle weakness (generalized) (M62.81);Difficulty in walking, not elsewhere classified (R26.2);Pain Pain - Right/Left: Right Pain - part of body: Hip;Leg    Time: 1220-1301 PT Time Calculation (min) (ACUTE ONLY): 41 min   Charges:   PT Evaluation $PT Eval Low Complexity: 1 Low PT Treatments $Therapeutic Activity: 23-37 mins PT General Charges $$ ACUTE PT VISIT: 1 Visit          [place], PT Acute Rehab   [place] VEAR Drone 09/29/2023, 1:36 PM

## 2023-09-30 DIAGNOSIS — M00051 Staphylococcal arthritis, right hip: Secondary | ICD-10-CM | POA: Diagnosis not present

## 2023-09-30 LAB — BASIC METABOLIC PANEL
Anion gap: 10 (ref 5–15)
BUN: 11 mg/dL (ref 6–20)
CO2: 27 mmol/L (ref 22–32)
Calcium: 9.3 mg/dL (ref 8.9–10.3)
Chloride: 102 mmol/L (ref 98–111)
Creatinine, Ser: 0.65 mg/dL (ref 0.44–1.00)
GFR, Estimated: >60 mL/min (ref >60)
Glucose, Bld: 93 mg/dL (ref 70–99)
Potassium: 3.5 mmol/L (ref 3.5–5.1)
Sodium: 139 mmol/L (ref 135–145)

## 2023-09-30 LAB — CBC
HCT: 23.3 % — ABNORMAL LOW (ref 36.0–46.0)
Hemoglobin: 7.3 g/dL — ABNORMAL LOW (ref 12.0–15.0)
MCH: 29.6 pg (ref 26.0–34.0)
MCHC: 31.3 g/dL (ref 30.0–36.0)
MCV: 94.3 fL (ref 80.0–100.0)
Platelets: 414 10*3/uL — ABNORMAL HIGH (ref 150–400)
RBC: 2.47 MIL/uL — ABNORMAL LOW (ref 3.87–5.11)
RDW: 13.5 % (ref 11.5–15.5)
WBC: 12.4 10*3/uL — ABNORMAL HIGH (ref 4.0–10.5)
nRBC: 0 % (ref 0.0–0.2)

## 2023-09-30 LAB — PREPARE RBC (CROSSMATCH)

## 2023-09-30 MED ORDER — SODIUM CHLORIDE 0.9% IV SOLUTION: Freq: Once | INTRAVENOUS | Status: AC

## 2023-09-30 MED ORDER — CHLORHEXIDINE GLUCONATE CLOTH 2 % EX PADS
6.0000 | MEDICATED_PAD | Freq: Every day | CUTANEOUS | Status: DC
  Administered 2023-10-01 – 2023-10-02 (×2): 6 via TOPICAL

## 2023-09-30 MED ORDER — SIMETHICONE 80 MG PO CHEW
80.0000 mg | CHEWABLE_TABLET | Freq: Four times a day (QID) | ORAL | Status: DC | PRN
  Administered 2023-09-30 – 2023-10-01 (×2): 80 mg via ORAL
  Filled 2023-09-30 (×2): qty 1

## 2023-09-30 MED ORDER — SODIUM CHLORIDE 0.9% FLUSH
10.0000 mL | INTRAVENOUS | Status: DC | PRN
  Administered 2023-10-02: 10 mL

## 2023-09-30 NOTE — Progress Notes (Signed)
 PROGRESS NOTE Melanie Li  FMW:[id number] DOB: [birth year] DOA: 09/22/2023 PCP: Wendolyn Jenkins Jansky, MD  Brief Narrative/Hospital Course: 58 year old with hypothyroidism who had undergone corticosteroid injection after an injury while playing pickle ball but after initial benefit having worsening pain, difficult to ambulate, had MRI of the spine and MRI of the hip that showed septic arthritis with a large joint fluid collection and sent to ED by Dr Fidel due to concern about her septic hip pain desired to have this aspirated and to have antibiotics started.  She is having right hip pain for nearly 1-1/53-month refractory to medications and intra-articular injection. S/p IR guided joint aspiration in 2/1, ID has been consulted, blood culture positive for MSSA and Gram stain on fluid GPC. 2D echocardiogram does not show evidence of endocarditis repeat blood cultures 09/24/23: no growth so far 2/5: s/p right hip girdlestone procedure,placement of articulating antibiotic spacer. Rt fem hear and tissues sent for culture   Subjective: Seen and examined this morning Reports being dizzy and blood pressure low while getting up with PT and did not do well yesterday Feels weak and ill Overnight afebrile, BP stable  Labs shows hemoglobin holding at 7.3 g, leukocytosis improved  Assessment and Plan: Principal Problem:   Septic arthritis (HCC) Active Problems:   Hypothyroidism (acquired)   Pain   MSSA bacteremia   Left hip pain   Right hip pain   Encounter for hepatitis C screening test for low risk patient   Acute osteomyelitis involving pelvic region and thigh, right (HCC)   Osteomyelitis of right femur (HCC)  Right hip MSSA septic arthritis with destruction of cartilage and bone MSSA bacteremia due to above: Blood cx 1/31 mssa. S/p IR guided joint aspiration on 2/1-purulent fluid was obtained and culture- gram stain- GPC In cluster cultures no anaerobes isolated.TTE 09/24/23-EF 60 to 65%, G1 DD,  mitral valve normal aortic valve tricuspid cannot exclude small PFO 2/5: s/p right hip girdlestone procedure,placement of articulating antibiotic spacer Rt fem headand tissues sent for culture- gram stain no organism-IntraOp INTRA-OP culture NGTD Repeat blood culture from  2/2 NGTD and continue plan for PICC and 6 weeks of Ancef  as per  ID Hopefully discharge on Monday   Hypothyroidism:  Continue home levothyroxine    ABLA Acute anemia-likely from acute illness Likely in the setting of acute illness septic arthritis -also had 500 cc blood loss in surgery Hb a month ago was 12.6 on admission 9.2 and downtrending further 7.0> transfused 1 unit PRBC 2/6 She appears symptomatic with her ABLA -discussed again and agreeable for 1 unit PRBC  Iron panel suggesting iron deficiency.cont iron supplement Denies melena.She had colonoscopy more than 10 years ago at [place]  that was negative. Recent Labs    09/23/23 1300 09/23/23 1508 09/24/23 0557 09/28/23 0616 09/29/23 0540 09/29/23 0800 09/29/23 1637 09/30/23 0659  HGB 9.2*  --    < > 7.0* 6.4* 7.3* 7.4* 7.3*  MCV  --   --    < > 93.3 94.9 94.0  --  94.3  FERRITIN  --  903*  --   --   --   --   --   --   TIBC 130*  --   --   --   --   --   --   --   IRON 19*  --   --   --   --   --   --   --    < > = values in this interval not  displayed.    Mild hyponatremia: Encourage p.o. intake.  Improved  Thrombocytosis: Likely reactive.  Improving.  Monitor  Hypokalemia: Resolved.  DVT prophylaxis: SCDs Start: 09/28/23 0803 Place TED hose Start: 09/28/23 0803 SCDs Start: 09/22/23 1911 Code Status:   Code Status: Full Code Family Communication: plan of care discussed with patient. Her husband was at bedside. Patient status is: Remains hospitalized because of severity of illness Level of care: Telemetry   Dispo: The patient is from: home            Anticipated disposition: home /w picc line on Monday  Objective: Vitals last 24  hrs: Vitals:   09/29/23 0513 09/29/23 1513 09/29/23 2040 09/30/23 0543  BP: 109/75 106/65 104/72 116/84  Pulse: 78 85 93 92  Resp: 15 18 17 18   Temp: 98 F (36.7 C) 98.1 F (36.7 C) 99 F (37.2 C) 98.5 F (36.9 C)  TempSrc: Oral     SpO2: 98% 99% 99% 99%  Weight:      Height:       Weight change:   Physical Examination: General exam: alert awake, oriented but appears place and weak HEENT:Oral mucosa moist, Ear/Nose WNL grossly Respiratory system: Bilaterally clear BS,no use of accessory muscle Cardiovascular system: S1 & S2 +, No JVD. Gastrointestinal system: Abdomen soft,NT,ND, BS+ Nervous System: Alert, awake, moving all extremities,and following commands. Extremities: LE edema neg, rt groin w/ Aquacel dressing in place  w/ mild redness Skin: No rashes,no icterus. MSK: Normal muscle bulk,tone, power   Medications reviewed:  Scheduled Meds:  sodium chloride    Intravenous Once   sodium chloride    Intravenous Once   aspirin   81 mg Oral BID   Chlorhexidine  Gluconate Cloth  6 each Topical Daily   docusate sodium   100 mg Oral BID   Ferrous Fumarate   1 tablet Oral Daily   gabapentin   300 mg Oral QHS   levothyroxine   100 mcg Oral QAC breakfast   senna-docusate  2 tablet Oral BID   sodium chloride  flush  3 mL Intravenous Q12H   Continuous Infusions:   ceFAZolin  (ANCEF ) IV 2 g (09/30/23 0557)    Diet Order             Diet regular Room service appropriate? Yes; Fluid consistency: Thin  Diet effective now                   Intake/Output Summary (Last 24 hours) at 09/30/2023 1214 Last data filed at 09/30/2023 0600 Gross per 24 hour  Intake 530 ml  Output 700 ml  Net -170 ml   Net IO Since Admission: 2,323.67 mL [09/30/23 1214]  Wt Readings from Last 3 Encounters:  09/27/23 70 kg  08/20/23 70.3 kg  02/10/22 73.9 kg     Unresulted Labs (From admission, onward)     Start     Ordered   09/30/23 1201  Prepare RBC (crossmatch)  (Blood Administration Adult)  Once,    R       Question Answer Comment  # of Units 1 unit   Transfusion Indications Other   Comments symptomatic anemia   Number of Units to Keep Ahead NO units ahead   If emergent release call blood bank Not emergent release      09/30/23 1200          Data Reviewed: I have personally reviewed following labs and imaging studies CBC: Recent Labs  Lab 09/27/23 0548 09/27/23 1801 09/28/23 0616 09/29/23 0540 09/29/23 0800 09/29/23 1637 09/30/23  0659  WBC 11.6*  --  17.0* 11.5* 13.3*  --  12.4*  HGB 8.6*   < > 7.0* 6.4* 7.3* 7.4* 7.3*  HCT 26.7*   < > 22.4* 20.4* 23.4* 23.4* 23.3*  MCV 92.7  --  93.3 94.9 94.0  --  94.3  PLT 451*  --  472* 355 447*  --  414*   < > = values in this interval not displayed.   Basic Metabolic Panel:  Recent Labs  Lab 09/27/23 0548 09/27/23 1801 09/28/23 0616 09/29/23 0540 09/29/23 0800 09/30/23 0659  NA 136 136 132* 128* 138 139  K 3.8 4.0 3.8 3.0* 3.4* 3.5  CL 101  --  96* 88* 103 102  CO2 25  --  26 23 26 27   GLUCOSE 105*  --  108* 569* 95 93  BUN 10  --  13 11 12 11   CREATININE 0.40*  --  0.77 1.51* 0.66 0.65  CALCIUM 8.7*  --  8.3* 7.7* 9.0 9.3   GFR: Estimated Creatinine Clearance: 76.4 mL/min (by C-G formula based on SCr of 0.65 mg/dL). Liver Function Tests:  No results for input(s): AST, ALT, ALKPHOS, BILITOT, PROT, ALBUMIN  in the last 168 hours.  Recent Results (from the past 240 hours)  Blood culture (routine x 2)     Status: Abnormal   Collection Time: 09/22/23  4:50 PM   Specimen: BLOOD  Result Value Ref Range Status   Specimen Description   Final    BLOOD LEFT ANTECUBITAL Performed at [facility], [address], [place], [place] [address]    Special Requests   Final    BOTTLES DRAWN AEROBIC AND ANAEROBIC Blood Culture adequate volume Performed at [facility], [address], [place], [place] [address]    Culture  Setup Time   Final    GRAM POSITIVE COCCI IN  CLUSTERS IN BOTH AEROBIC AND ANAEROBIC BOTTLES CRITICAL RESULT CALLED TO, READ BACK BY AND VERIFIED WITH: MAYA SHUCK SWAYNE [phone] AT 1134 BY EC Performed at [facility], [address], [place], [place] [address]    Culture STAPHYLOCOCCUS AUREUS (A)  Final   Report Status 09/25/2023 FINAL  Final   Organism ID, Bacteria STAPHYLOCOCCUS AUREUS  Final      Susceptibility   Staphylococcus aureus - MIC*    CIPROFLOXACIN <=0.5 SENSITIVE Sensitive     ERYTHROMYCIN <=0.25 SENSITIVE Sensitive     GENTAMICIN <=0.5 SENSITIVE Sensitive     OXACILLIN 0.5 SENSITIVE Sensitive     TETRACYCLINE <=1 SENSITIVE Sensitive     VANCOMYCIN  <=0.5 SENSITIVE Sensitive     TRIMETH/SULFA <=10 SENSITIVE Sensitive     CLINDAMYCIN <=0.25 SENSITIVE Sensitive     RIFAMPIN <=0.5 SENSITIVE Sensitive     Inducible Clindamycin NEGATIVE Sensitive     LINEZOLID 2 SENSITIVE Sensitive     * STAPHYLOCOCCUS AUREUS  Blood Culture ID Panel (Reflexed)     Status: Abnormal   Collection Time: 09/22/23  4:50 PM  Result Value Ref Range Status   Enterococcus faecalis NOT DETECTED NOT DETECTED Final   Enterococcus Faecium NOT DETECTED NOT DETECTED Final   Listeria monocytogenes NOT DETECTED NOT DETECTED Final   Staphylococcus species DETECTED (A) NOT DETECTED Final    Comment: CRITICAL RESULT CALLED TO, READ BACK BY AND VERIFIED WITH: PHARMD MARY SWAYNE [phone] AT 1134 BY EC    Staphylococcus aureus (BCID) DETECTED (A) NOT DETECTED Final    Comment: CRITICAL RESULT CALLED TO, READ BACK BY  AND VERIFIED WITH: PHARMD MARY SWAYNE [phone] AT 1134 BY EC    Staphylococcus epidermidis NOT DETECTED NOT DETECTED Final   Staphylococcus lugdunensis NOT DETECTED NOT DETECTED Final   Streptococcus species NOT DETECTED NOT DETECTED Final   Streptococcus agalactiae NOT DETECTED NOT DETECTED Final   Streptococcus pneumoniae NOT DETECTED NOT DETECTED Final   Streptococcus pyogenes NOT DETECTED NOT DETECTED Final    A.calcoaceticus-baumannii NOT DETECTED NOT DETECTED Final   Bacteroides fragilis NOT DETECTED NOT DETECTED Final   Enterobacterales NOT DETECTED NOT DETECTED Final   Enterobacter cloacae complex NOT DETECTED NOT DETECTED Final   Escherichia coli NOT DETECTED NOT DETECTED Final   Klebsiella aerogenes NOT DETECTED NOT DETECTED Final   Klebsiella oxytoca NOT DETECTED NOT DETECTED Final   Klebsiella pneumoniae NOT DETECTED NOT DETECTED Final   Proteus species NOT DETECTED NOT DETECTED Final   Salmonella species NOT DETECTED NOT DETECTED Final   Serratia marcescens NOT DETECTED NOT DETECTED Final   Haemophilus influenzae NOT DETECTED NOT DETECTED Final   Neisseria meningitidis NOT DETECTED NOT DETECTED Final   Pseudomonas aeruginosa NOT DETECTED NOT DETECTED Final   Stenotrophomonas maltophilia NOT DETECTED NOT DETECTED Final   Candida albicans NOT DETECTED NOT DETECTED Final   Candida auris NOT DETECTED NOT DETECTED Final   Candida glabrata NOT DETECTED NOT DETECTED Final   Candida krusei NOT DETECTED NOT DETECTED Final   Candida parapsilosis NOT DETECTED NOT DETECTED Final   Candida tropicalis NOT DETECTED NOT DETECTED Final   Cryptococcus neoformans/gattii NOT DETECTED NOT DETECTED Final   Meth resistant mecA/C and MREJ NOT DETECTED NOT DETECTED Final    Comment: Performed at [facility], [address], [place], [place] [address]  Blood culture (routine x 2)     Status: Abnormal   Collection Time: 09/22/23  5:07 PM   Specimen: BLOOD  Result Value Ref Range Status   Specimen Description   Final    BLOOD RIGHT ANTECUBITAL Performed at [facility], [address], [place], [place] [address]    Special Requests   Final    BOTTLES DRAWN AEROBIC AND ANAEROBIC Blood Culture adequate volume Performed at [facility], [address], [place], [place] [address]    Culture  Setup Time   Final    GRAM POSITIVE COCCI IN CLUSTERS IN BOTH AEROBIC  AND ANAEROBIC BOTTLES CRITICAL VALUE NOTED.  VALUE IS CONSISTENT WITH PREVIOUSLY REPORTED AND CALLED VALUE.    Culture (A)  Final    STAPHYLOCOCCUS AUREUS SUSCEPTIBILITIES PERFORMED ON PREVIOUS CULTURE WITHIN THE LAST 5 DAYS. Performed at [facility], [address], [place], [place] [address]    Report Status 09/25/2023 FINAL  Final  Anaerobic culture w Gram Stain     Status: None   Collection Time: 09/23/23 10:19 AM   Specimen: Joint, Right Hip; Synovial Fluid  Result Value Ref Range Status   Specimen Description   Final    SYNOVIAL Performed at [facility], [address], [place], [place] [address]    Special Requests   Final    NONE RIGHT HIP Performed at [facility], [address], [place], [place] [address]    Gram Stain   Final    MODERATE WBC PRESENT,BOTH PMN AND MONONUCLEAR FEW GRAM POSITIVE COCCI IN CLUSTERS    Culture   Final    NO ANAEROBES ISOLATED Performed at [facility], [address] [place], [place] [address]  Report Status 09/28/2023 FINAL  Final  Culture, blood (Routine X 2) w Reflex to ID Panel     Status: None   Collection Time: 09/24/23  5:57 AM   Specimen: BLOOD RIGHT ARM  Result Value Ref Range Status   Specimen Description   Final    BLOOD RIGHT ARM Performed at [facility], [address], [place], [place] [address]    Special Requests   Final    BOTTLES DRAWN AEROBIC AND ANAEROBIC Blood Culture results may not be optimal due to an inadequate volume of blood received in culture bottles Performed at [facility], [address], [place], [place] [address]    Culture   Final    NO GROWTH 5 DAYS Performed at [facility], [address] [place], [place] [address]    Report Status 09/29/2023 FINAL  Final  Culture, blood (Routine X 2) w Reflex to ID Panel     Status: None   Collection Time: 09/24/23  6:04 AM   Specimen: BLOOD RIGHT ARM  Result Value Ref  Range Status   Specimen Description   Final    BLOOD RIGHT ARM Performed at [facility], [address] [place] [address], [place], [place] [address]    Special Requests   Final    BOTTLES DRAWN AEROBIC AND ANAEROBIC Blood Culture adequate volume Performed at [facility], [address], [place], [place] [address]    Culture   Final    NO GROWTH 5 DAYS Performed at [facility], [address] [place], [place] [address]    Report Status 09/29/2023 FINAL  Final  Aerobic/Anaerobic Culture w Gram Stain (surgical/deep wound)     Status: None (Preliminary result)   Collection Time: 09/27/23  3:05 PM   Specimen: Path Tissue  Result Value Ref Range Status   Specimen Description   Final    TISSUE RIGHT FEMORAL HEAD Performed at [facility], [address] [place], [place] [address]    Special Requests   Final    NONE Performed at [facility], [address] [place], [place] [address]    Gram Stain   Final    RARE WBC PRESENT,BOTH PMN AND MONONUCLEAR NO ORGANISMS SEEN    Culture   Final    NO GROWTH < 24 HOURS Performed at [facility], [address] [place], [place] [address]    Report Status PENDING  Incomplete    Antimicrobials/Microbiology: Anti-infectives (From admission, onward)    Start     Dose/Rate Route Frequency Ordered Stop   09/27/23 1819  vancomycin  (VANCOCIN ) powder  Status:  Discontinued          As needed 09/27/23 1820 09/27/23 1957   09/27/23 1707  tobramycin  (NEBCIN ) powder  Status:  Discontinued          As needed 09/27/23 1707 09/27/23 1957   09/27/23 1400  ceFAZolin  (ANCEF ) IVPB 2g/100 mL premix        2 g 200 mL/hr over 30 Minutes Intravenous On call to O.R. 09/27/23 1255 09/27/23 1637   09/23/23 1530  ceFAZolin  (ANCEF ) IVPB 2g/100 mL premix        2 g 200 mL/hr over 30 Minutes Intravenous Every 8 hours 09/23/23 1433     09/23/23 1130  cefTRIAXone  (ROCEPHIN ) 2 g in sodium chloride  0.9 %  100 mL IVPB  Status:  Discontinued        2  g 200 mL/hr over 30 Minutes Intravenous Daily 09/23/23 1112 09/23/23 1432   09/23/23 1115  vancomycin  (VANCOREADY) IVPB 1500 mg/300 mL  Status:  Discontinued        1,500 mg 150 mL/hr over 120 Minutes Intravenous  Once 09/23/23 1112 09/23/23 1432         Component Value Date/Time   SDES  09/27/2023 1505    TISSUE RIGHT FEMORAL HEAD Performed at [facility], [address] [place], [place] [address]    SPECREQUEST  09/27/2023 1505    NONE Performed at [facility], [address] [place], [place] [address]    CULT  09/27/2023 1505    NO GROWTH < 24 HOURS Performed at [facility], [address] [place], [place] [address]    REPTSTATUS PENDING 09/27/2023 1505     Radiology Studies: US  EKG SITE RITE Result Date: 09/29/2023 If [facility] image not attached, placement could not be confirmed due to current cardiac rhythm.    LOS: 8 days   Total time spent in review of labs and imaging, patient evaluation, formulation of plan, documentation and communication with family: 35 minutes  Mennie LAMY, MD  [facility]  09/30/2023, 12:14 PM

## 2023-09-30 NOTE — Progress Notes (Signed)
 Peripherally Inserted Central Catheter Placement  The IV Nurse has discussed with the patient and/or persons authorized to consent for the patient, the purpose of this procedure and the potential benefits and risks involved with this procedure.  The benefits include less needle sticks, lab draws from the catheter, and the patient may be discharged home with the catheter. Risks include, but not limited to, infection, bleeding, blood clot (thrombus formation), and puncture of an artery; nerve damage and irregular heartbeat and possibility to perform a PICC exchange if needed/ordered by physician.  Alternatives to this procedure were also discussed.  Bard Power PICC patient education guide, fact sheet on infection prevention and patient information card has been provided to patient /or left at bedside.    PICC Placement Documentation  PICC Single Lumen 09/30/23 Right Brachial 37 cm 0 cm (Active)  Indication for Insertion or Continuance of Line Home intravenous therapies (PICC only) 09/30/23 1052  Exposed Catheter (cm) 0 cm 09/30/23 1052  Site Assessment Clean, Dry, Intact 09/30/23 1052  Line Status Saline locked;Blood return noted 09/30/23 1052  Dressing Type Transparent;Securing device 09/30/23 1052  Dressing Status Antimicrobial disc/dressing in place;Clean, Dry, Intact 09/30/23 1052  Line Care Connections checked and tightened 09/30/23 1052  Line Adjustment (NICU/IV Team Only) No 09/30/23 1052  Dressing Intervention New dressing;Adhesive placed at insertion site (IV team only) 09/30/23 1052  Dressing Change Due 10/07/23 09/30/23 1052       Melanie Li 09/30/2023, 11:04 AM

## 2023-09-30 NOTE — Plan of Care (Signed)

## 2023-09-30 NOTE — Progress Notes (Signed)
 Physical Therapy Treatment Patient Details Name: Melanie Li MRN: [id number] DOB: [birth year] Today's Date: 09/30/2023   History of Present Illness 58 yo female presents to therapy following hospital admission in 09/22/2023 for girdlestone procedure, debridement and placement of articulating ABX spacer in R hip on 09/27/2023 secondary to septic arthritis with culture positive for MRSA. Pt is currently R LE TDWB with RW.  Pt hospitalization complicated due to low HgB requiring transfusion 2/6.Pt PMH includes but si not limited to: tachycardia and thyroid  dz.    PT Comments  Pt seen POD3 receiving blood transfusion so limited to therapeutic exercise in supine. Pt reporting some burning under her bandage as well as continued numbness in the area of the femoral nerve on the RLE, informed nurse and provided ice pack with instructions for 30m on, 6m off. Pt guided through supine exercises with AROM and AAROM with gait belt as required, pt unable to perform hip abduction at this time, cuing provided for form and to stay in the pain-free ROM this date, emphasis on ankle pumps and incentive spirometry, pt able to achieve with cuing. Pt remains motivated to improve. We will continue to follow acutely.    If plan is discharge home, recommend the following: A little help with walking and/or transfers;A little help with bathing/dressing/bathroom;Assistance with cooking/housework;Assist for transportation;Help with stairs or ramp for entrance   Can travel by private vehicle        Equipment Recommendations  Rolling walker (2 wheels);BSC/3in1    Recommendations for Other Services       Precautions / Restrictions Precautions Precautions: Fall Restrictions Weight Bearing Restrictions Per Provider Order: Yes RLE Weight Bearing Per Provider Order: Touchdown weight bearing     Mobility  Bed Mobility               General bed mobility comments: deferred    Transfers                    General transfer comment: deferred    Ambulation/Gait               General Gait Details: deferred   Stairs             Wheelchair Mobility     Tilt Bed    Modified Rankin (Stroke Patients Only)       Balance                                            Cognition Arousal: Alert Behavior During Therapy: WFL for tasks assessed/performed Overall Cognitive Status: Within Functional Limits for tasks assessed                                          Exercises Total Joint Exercises Ankle Circles/Pumps: Both, AROM, 20 reps, Supine Quad Sets: AROM, Right, 5 reps, Supine Gluteal Sets: AROM, Both, 5 reps, Supine Short Arc Quad: AAROM, Right, 5 reps, Supine Heel Slides: AAROM, Right, 10 reps, Supine Hip ABduction/ADduction: Other (comment) (attempted, pt unable)    General Comments        Pertinent Vitals/Pain Pain Assessment Pain Assessment: No/denies pain    Home Living  Prior Function            PT Goals (current goals can now be found in the care plan section) Acute Rehab PT Goals Patient Stated Goal: to be able to do regular life again and walk normally PT Goal Formulation: With patient Time For Goal Achievement: 10/13/23 Potential to Achieve Goals: Good Progress towards PT goals: Not progressing toward goals - comment (Limited by orthostatics and blood transfusion)    Frequency    7X/week      PT Plan      Co-evaluation              AM-PAC PT 6 Clicks Mobility   Outcome Measure  Help needed turning from your back to your side while in a flat bed without using bedrails?: A Little Help needed moving from lying on your back to sitting on the side of a flat bed without using bedrails?: A Little Help needed moving to and from a bed to a chair (including a wheelchair)?: A Little Help needed standing up from a chair using your arms (e.g., wheelchair or  bedside chair)?: Total Help needed to walk in hospital room?: Total Help needed climbing 3-5 steps with a railing? : Total 6 Click Score: 12    End of Session Equipment Utilized During Treatment: Gait belt (around R foot) Activity Tolerance: Patient limited by fatigue;Treatment limited secondary to medical complications (Comment) (blood transfusion) Patient left: in bed;with call bell/phone within reach;with bed alarm set Nurse Communication: Mobility status;Weight bearing status PT Visit Diagnosis: Unsteadiness on feet (R26.81);Other abnormalities of gait and mobility (R26.89);Muscle weakness (generalized) (M62.81);Difficulty in walking, not elsewhere classified (R26.2);Pain Pain - Right/Left: Right Pain - part of body: Hip;Leg     Time: 8394-8362 PT Time Calculation (min) (ACUTE ONLY): 32 min  Charges:    $Therapeutic Exercise: 8-22 mins $Therapeutic Activity: 8-22 mins PT General Charges $$ ACUTE PT VISIT: 1 Visit                     Elsie Grieves, PT, DPT [facility] Department Office: [phone]   Elsie Grieves 09/30/2023, 5:38 PM

## 2023-10-01 DIAGNOSIS — M00051 Staphylococcal arthritis, right hip: Secondary | ICD-10-CM | POA: Diagnosis not present

## 2023-10-01 LAB — CBC
HCT: 27.2 % — ABNORMAL LOW (ref 36.0–46.0)
Hemoglobin: 8.7 g/dL — ABNORMAL LOW (ref 12.0–15.0)
MCH: 29.6 pg (ref 26.0–34.0)
MCHC: 32 g/dL (ref 30.0–36.0)
MCV: 92.5 fL (ref 80.0–100.0)
Platelets: 402 10*3/uL — ABNORMAL HIGH (ref 150–400)
RBC: 2.94 MIL/uL — ABNORMAL LOW (ref 3.87–5.11)
RDW: 15.3 % (ref 11.5–15.5)
WBC: 13.3 10*3/uL — ABNORMAL HIGH (ref 4.0–10.5)
nRBC: 0 % (ref 0.0–0.2)

## 2023-10-01 NOTE — Plan of Care (Signed)

## 2023-10-01 NOTE — Plan of Care (Signed)
  Problem: Nutrition: Goal: Adequate nutrition will be maintained Outcome: Progressing   Problem: Elimination: Goal: Will not experience complications related to bowel motility Outcome: Progressing   Problem: Pain Managment: Goal: General experience of comfort will improve and/or be controlled Outcome: Progressing

## 2023-10-01 NOTE — Progress Notes (Signed)
 PROGRESS NOTE Melanie Li  FMW:[id number] DOB: 09/08/1965 DOA: 09/22/2023 PCP: Wendolyn Jenkins Jansky, MD  Brief Narrative/Hospital Course: 58 year old with hypothyroidism who had undergone corticosteroid injection after an injury while playing pickle ball but after initial benefit having worsening pain, difficult to ambulate, had MRI of the spine and MRI of the hip that showed septic arthritis with a large joint fluid collection and sent to ED by Dr Fidel due to concern about her septic hip pain desired to have this aspirated and to have antibiotics started.  She is having right hip pain for nearly 1-1/61-month refractory to medications and intra-articular injection. S/p IR guided joint aspiration in 2/1, ID has been consulted, blood culture positive for MSSA and Gram stain on fluid GPC. 2D echocardiogram does not show evidence of endocarditis repeat blood cultures 09/24/23: no growth so far 2/5: s/p right hip girdlestone procedure,placement of articulating antibiotic spacer. Rt fem hear and tissues sent for culture Patient remains symptomatic with anemia/ABLA received total 2 units PRBC and hemoglobin stabilized   Subjective: Patient seen and examined She appears weak.   Overnight afebrile BP overall stable in 90s to 100.   Labs shows hemoglobin improved to 8.7 g  She worked with PT yesterday.  Assessment and Plan: Principal Problem:   Septic arthritis (HCC) Active Problems:   Hypothyroidism (acquired)   Pain   MSSA bacteremia   Left hip pain   Right hip pain   Encounter for hepatitis C screening test for low risk patient   Acute osteomyelitis involving pelvic region and thigh, right (HCC)   Osteomyelitis of right femur (HCC)  Right hip MSSA septic arthritis with destruction of cartilage and bone MSSA bacteremia due to above: Blood cx 1/31 mssa. S/p IR guided joint aspiration on 2/1-purulent fluid was obtained and culture- gram stain- GPC In cluster cultures no anaerobes isolated.TTE  09/24/23-EF 60 to 65%, G1 DD, mitral valve normal aortic valve tricuspid cannot exclude small PFO 2/5: s/p right hip girdlestone procedure,placement of articulating antibiotic spacer Rt fem headand tissues sent for culture- gram stain no organism-IntraOp INTRA-OP culture NGTD and in progress x 5 days Repeat blood culture from  2/2 NGTD s/p PICC and planning for 6 wk  of Ancef  as per  ID Hopefully discharge on Monday   Hypothyroidism:  Continue home levothyroxine    ABLA Acute anemia-likely from acute illness Likely in the setting of acute illness septic arthritis -also had 500 cc blood loss in surgery Hb a month ago was 12.6 on admission 9.2 and downtrending So far total 2 units PRBC transfusion hemoglobin appropriately increased. Iron panel suggesting iron deficiency.cont iron supplement Denies melena.She had colonoscopy >10 yrs ago at [place]  that was negative. Recent Labs    09/23/23 1300 09/23/23 1508 09/24/23 0557 09/29/23 0540 09/29/23 0800 09/29/23 1637 09/30/23 0659 10/01/23 0655  HGB 9.2*  --    < > 6.4* 7.3* 7.4* 7.3* 8.7*  MCV  --   --    < > 94.9 94.0  --  94.3 92.5  FERRITIN  --  903*  --   --   --   --   --   --   TIBC 130*  --   --   --   --   --   --   --   IRON 19*  --   --   --   --   --   --   --    < > = values in this interval not displayed.  Mild hyponatremia: Resolved.  Thrombocytosis: Likely reactive.  Downtrending.  Hypokalemia: Resolved.  Deconditioning/debility: Will encourage PT OT while here  DVT prophylaxis: SCDs Start: 09/28/23 0803 Place TED hose Start: 09/28/23 0803 SCDs Start: 09/22/23 1911 Code Status:   Code Status: Full Code Family Communication: plan of care discussed with patient. Her husband was at bedside. Patient status is: Remains hospitalized because of severity of illness Level of care: Telemetry   Dispo: The patient is from: home            Anticipated disposition: home /w picc line on Monday  Objective: Vitals  last 24 hrs: Vitals:   09/30/23 1454 09/30/23 1658 09/30/23 2015 10/01/23 0440  BP: 98/63 107/80 101/67 116/77  Pulse: 84 86 87 78  Resp: 18 18 18 17   Temp: 98.9 F (37.2 C) 98.9 F (37.2 C) 98.7 F (37.1 C) 98 F (36.7 C)  TempSrc: Oral Oral    SpO2: 100% 100% 97% 96%  Weight:      Height:       Weight change:  Physical Examination: General exam: alert awake, oriented at baseline, older than stated age. HEENT:Oral mucosa moist, Ear/Nose WNL grossly. Respiratory system: Bilaterally clear BS,no use of accessory muscle. Cardiovascular system: S1 & S2 +, No JVD. Gastrointestinal system: Abdomen soft,NT,ND, BS+. Nervous System: Alert, awake, moving all extremities,and following commands. Extremities: rt groin w/ ongoing  aquacel dressing+. Skin: No rashes,no icterus. MSK: Normal muscle bulk,tone, power.  Medications reviewed:  Scheduled Meds:  sodium chloride    Intravenous Once   aspirin   81 mg Oral BID   Chlorhexidine  Gluconate Cloth  6 each Topical Daily   docusate sodium   100 mg Oral BID   Ferrous Fumarate   1 tablet Oral Daily   gabapentin   300 mg Oral QHS   levothyroxine   100 mcg Oral QAC breakfast   senna-docusate  2 tablet Oral BID   sodium chloride  flush  3 mL Intravenous Q12H   Continuous Infusions:   ceFAZolin  (ANCEF ) IV 2 g (10/01/23 0528)    Diet Order             Diet regular Room service appropriate? Yes; Fluid consistency: Thin  Diet effective now                   Intake/Output Summary (Last 24 hours) at 10/01/2023 1131 Last data filed at 10/01/2023 0300 Gross per 24 hour  Intake 787.83 ml  Output 1000 ml  Net -212.17 ml   Net IO Since Admission: 2,111.5 mL [10/01/23 1131]  Wt Readings from Last 3 Encounters:  09/27/23 70 kg  08/20/23 70.3 kg  02/10/22 73.9 kg     Unresulted Labs (From admission, onward)    None     Data Reviewed: I have personally reviewed following labs and imaging studies CBC: Recent Labs  Lab 09/28/23 0616  09/29/23 0540 09/29/23 0800 09/29/23 1637 09/30/23 0659 10/01/23 0655  WBC 17.0* 11.5* 13.3*  --  12.4* 13.3*  HGB 7.0* 6.4* 7.3* 7.4* 7.3* 8.7*  HCT 22.4* 20.4* 23.4* 23.4* 23.3* 27.2*  MCV 93.3 94.9 94.0  --  94.3 92.5  PLT 472* 355 447*  --  414* 402*   Basic Metabolic Panel:  Recent Labs  Lab 09/27/23 0548 09/27/23 1801 09/28/23 0616 09/29/23 0540 09/29/23 0800 09/30/23 0659  NA 136 136 132* 128* 138 139  K 3.8 4.0 3.8 3.0* 3.4* 3.5  CL 101  --  96* 88* 103 102  CO2 25  --  26  23 26 27   GLUCOSE 105*  --  108* 569* 95 93  BUN 10  --  13 11 12 11   CREATININE 0.40*  --  0.77 1.51* 0.66 0.65  CALCIUM 8.7*  --  8.3* 7.7* 9.0 9.3   GFR: Estimated Creatinine Clearance: 76.4 mL/min (by C-G formula based on SCr of 0.65 mg/dL). Liver Function Tests:  No results for input(s): AST, ALT, ALKPHOS, BILITOT, PROT, ALBUMIN  in the last 168 hours.  Recent Results (from the past 240 hours)  Blood culture (routine x 2)     Status: Abnormal   Collection Time: 09/22/23  4:50 PM   Specimen: BLOOD  Result Value Ref Range Status   Specimen Description   Final    BLOOD LEFT ANTECUBITAL Performed at [facility], [address] [place], [place] [address]    Special Requests   Final    BOTTLES DRAWN AEROBIC AND ANAEROBIC Blood Culture adequate volume Performed at [facility], [address] [place], [place] [address]    Culture  Setup Time   Final    GRAM POSITIVE COCCI IN CLUSTERS IN BOTH AEROBIC AND ANAEROBIC BOTTLES CRITICAL RESULT CALLED TO, READ BACK BY AND VERIFIED WITH: MAYA SHUCK SWAYNE [phone] AT 1134 BY EC Performed at [facility], [address] [place], [place] [address]    Culture STAPHYLOCOCCUS AUREUS (A)  Final   Report Status 09/25/2023 FINAL  Final   Organism ID, Bacteria STAPHYLOCOCCUS AUREUS  Final      Susceptibility   Staphylococcus aureus - MIC*    CIPROFLOXACIN <=0.5 SENSITIVE Sensitive      ERYTHROMYCIN <=0.25 SENSITIVE Sensitive     GENTAMICIN <=0.5 SENSITIVE Sensitive     OXACILLIN 0.5 SENSITIVE Sensitive     TETRACYCLINE <=1 SENSITIVE Sensitive     VANCOMYCIN  <=0.5 SENSITIVE Sensitive     TRIMETH/SULFA <=10 SENSITIVE Sensitive     CLINDAMYCIN <=0.25 SENSITIVE Sensitive     RIFAMPIN <=0.5 SENSITIVE Sensitive     Inducible Clindamycin NEGATIVE Sensitive     LINEZOLID 2 SENSITIVE Sensitive     * STAPHYLOCOCCUS AUREUS  Blood Culture ID Panel (Reflexed)     Status: Abnormal   Collection Time: 09/22/23  4:50 PM  Result Value Ref Range Status   Enterococcus faecalis NOT DETECTED NOT DETECTED Final   Enterococcus Faecium NOT DETECTED NOT DETECTED Final   Listeria monocytogenes NOT DETECTED NOT DETECTED Final   Staphylococcus species DETECTED (A) NOT DETECTED Final    Comment: CRITICAL RESULT CALLED TO, READ BACK BY AND VERIFIED WITH: PHARMD MARY SWAYNE [id number] AT 1134 BY EC    Staphylococcus aureus (BCID) DETECTED (A) NOT DETECTED Final    Comment: CRITICAL RESULT CALLED TO, READ BACK BY AND VERIFIED WITH: PHARMD MARY SWAYNE [id number] AT 1134 BY EC    Staphylococcus epidermidis NOT DETECTED NOT DETECTED Final   Staphylococcus lugdunensis NOT DETECTED NOT DETECTED Final   Streptococcus species NOT DETECTED NOT DETECTED Final   Streptococcus agalactiae NOT DETECTED NOT DETECTED Final   Streptococcus pneumoniae NOT DETECTED NOT DETECTED Final   Streptococcus pyogenes NOT DETECTED NOT DETECTED Final   A.calcoaceticus-baumannii NOT DETECTED NOT DETECTED Final   Bacteroides fragilis NOT DETECTED NOT DETECTED Final   Enterobacterales NOT DETECTED NOT DETECTED Final   Enterobacter cloacae complex NOT DETECTED NOT DETECTED Final   Escherichia coli NOT DETECTED NOT DETECTED Final   Klebsiella aerogenes NOT DETECTED NOT DETECTED Final   Klebsiella oxytoca NOT DETECTED NOT DETECTED Final  Klebsiella pneumoniae NOT DETECTED NOT DETECTED Final   Proteus species NOT DETECTED NOT  DETECTED Final   Salmonella species NOT DETECTED NOT DETECTED Final   Serratia marcescens NOT DETECTED NOT DETECTED Final   Haemophilus influenzae NOT DETECTED NOT DETECTED Final   Neisseria meningitidis NOT DETECTED NOT DETECTED Final   Pseudomonas aeruginosa NOT DETECTED NOT DETECTED Final   Stenotrophomonas maltophilia NOT DETECTED NOT DETECTED Final   Candida albicans NOT DETECTED NOT DETECTED Final   Candida auris NOT DETECTED NOT DETECTED Final   Candida glabrata NOT DETECTED NOT DETECTED Final   Candida krusei NOT DETECTED NOT DETECTED Final   Candida parapsilosis NOT DETECTED NOT DETECTED Final   Candida tropicalis NOT DETECTED NOT DETECTED Final   Cryptococcus neoformans/gattii NOT DETECTED NOT DETECTED Final   Meth resistant mecA/C and MREJ NOT DETECTED NOT DETECTED Final    Comment: Performed at [facility], [address], [place], [place] [address]  Blood culture (routine x 2)     Status: Abnormal   Collection Time: 09/22/23  5:07 PM   Specimen: BLOOD  Result Value Ref Range Status   Specimen Description   Final    BLOOD RIGHT ANTECUBITAL Performed at [facility], [address] [place], [place] [address]    Special Requests   Final    BOTTLES DRAWN AEROBIC AND ANAEROBIC Blood Culture adequate volume Performed at [facility], [address], [place], [place] [address]    Culture  Setup Time   Final    GRAM POSITIVE COCCI IN CLUSTERS IN BOTH AEROBIC AND ANAEROBIC BOTTLES CRITICAL VALUE NOTED.  VALUE IS CONSISTENT WITH PREVIOUSLY REPORTED AND CALLED VALUE.    Culture (A)  Final    STAPHYLOCOCCUS AUREUS SUSCEPTIBILITIES PERFORMED ON PREVIOUS CULTURE WITHIN THE LAST 5 DAYS. Performed at [facility], [address] [place], [place] [address]    Report Status 09/25/2023 FINAL  Final  Anaerobic culture w Gram Stain     Status: None   Collection Time: 09/23/23 10:19 AM   Specimen: Joint, Right Hip; Synovial Fluid   Result Value Ref Range Status   Specimen Description   Final    SYNOVIAL Performed at [facility], [address] [place], [place] [address]    Special Requests   Final    NONE RIGHT HIP Performed at [facility], [address] [place], [place] [address]    Gram Stain   Final    MODERATE WBC PRESENT,BOTH PMN AND MONONUCLEAR FEW GRAM POSITIVE COCCI IN CLUSTERS    Culture   Final    NO ANAEROBES ISOLATED Performed at [facility], [address] [place], [place] [address]    Report Status 09/28/2023 FINAL  Final  Culture, blood (Routine X 2) w Reflex to ID Panel     Status: None   Collection Time: 09/24/23  5:57 AM   Specimen: BLOOD RIGHT ARM  Result Value Ref Range Status   Specimen Description   Final    BLOOD RIGHT ARM Performed at [facility], [address] [place], [place] [address]    Special Requests   Final    BOTTLES DRAWN AEROBIC AND ANAEROBIC Blood Culture results may not be optimal due to an inadequate volume of blood received in culture bottles Performed at [facility], [address], [place], [place] [address]    Culture   Final    NO GROWTH 5 DAYS  Performed at [facility], [address] [place], [place] [address]    Report Status 09/29/2023 FINAL  Final  Culture, blood (Routine X 2) w Reflex to ID Panel     Status: None   Collection Time: 09/24/23  6:04 AM   Specimen: BLOOD RIGHT ARM  Result Value Ref Range Status   Specimen Description   Final    BLOOD RIGHT ARM Performed at [facility], [address] [place], [place] [address]    Special Requests   Final    BOTTLES DRAWN AEROBIC AND ANAEROBIC Blood Culture adequate volume Performed at [facility], [address], [place], [place] [address]    Culture   Final    NO GROWTH 5 DAYS Performed at [facility], [address] [place], [place] [address]    Report Status 09/29/2023  FINAL  Final  Aerobic/Anaerobic Culture w Gram Stain (surgical/deep wound)     Status: None (Preliminary result)   Collection Time: 09/27/23  3:05 PM   Specimen: Path Tissue  Result Value Ref Range Status   Specimen Description   Final    TISSUE RIGHT FEMORAL HEAD Performed at [facility], [address], [place], [place] [address]    Special Requests   Final    NONE Performed at [facility], [address], [place], [place] [address]    Gram Stain   Final    RARE WBC PRESENT,BOTH PMN AND MONONUCLEAR NO ORGANISMS SEEN    Culture   Final    NO GROWTH 3 DAYS NO ANAEROBES ISOLATED; CULTURE IN PROGRESS FOR 5 DAYS Performed at [facility], [address] [place], [place] [address]    Report Status PENDING  Incomplete    Antimicrobials/Microbiology: Anti-infectives (From admission, onward)    Start     Dose/Rate Route Frequency Ordered Stop   09/27/23 1819  vancomycin  (VANCOCIN ) powder  Status:  Discontinued          As needed 09/27/23 1820 09/27/23 1957   09/27/23 1707  tobramycin  (NEBCIN ) powder  Status:  Discontinued          As needed 09/27/23 1707 09/27/23 1957   09/27/23 1400  ceFAZolin  (ANCEF ) IVPB 2g/100 mL premix        2 g 200 mL/hr over 30 Minutes Intravenous On call to O.R. 09/27/23 1255 09/27/23 1637   09/23/23 1530  ceFAZolin  (ANCEF ) IVPB 2g/100 mL premix        2 g 200 mL/hr over 30 Minutes Intravenous Every 8 hours 09/23/23 1433     09/23/23 1130  cefTRIAXone  (ROCEPHIN ) 2 g in sodium chloride  0.9 % 100 mL IVPB  Status:  Discontinued        2 g 200 mL/hr over 30 Minutes Intravenous Daily 09/23/23 1112 09/23/23 1432   09/23/23 1115  vancomycin  (VANCOREADY) IVPB 1500 mg/300 mL  Status:  Discontinued        1,500 mg 150 mL/hr over 120 Minutes Intravenous  Once 09/23/23 1112 09/23/23 1432         Component Value Date/Time   SDES  09/27/2023 1505    TISSUE RIGHT FEMORAL HEAD Performed at [facility], [address], [place], [place] [address]    SPECREQUEST  09/27/2023 1505    NONE Performed at [facility], [address] [place], [place] [address]    CULT  09/27/2023 1505    NO GROWTH 3  DAYS NO ANAEROBES ISOLATED; CULTURE IN PROGRESS FOR 5 DAYS Performed at [facility], [address], [place], [place] [address]    REPTSTATUS PENDING 09/27/2023 1505     Radiology Studies: No results found.    LOS: 9 days   Total time spent in review of labs and imaging, patient evaluation, formulation of plan, documentation and communication with family: 35 minutes  Mennie LAMY, MD  [facility]  10/01/2023, 11:31 AM

## 2023-10-01 NOTE — Progress Notes (Signed)
 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 Pt is currently R LE TDWB with RW.  Pt hospitalization complicated due to low HgB requiring transfusion 2/6 and 2/8.  Pt PMH includes but si not limited to: tachycardia and thyroid  dz.    PT Comments  Pt reports standing with partner at bedside this morning and denies dizziness.  Pt assisted with ambulating 40 ft in hallway limited by fatigue.  Pt denies dizziness.  Pt has transport chair at home if needed for longer distances.  Pt still awaiting RW which she will need for d/c and would benefit from 3in1 as well if possible.  Pt states she has one step into home and also one step down into shower.  Pt does have a shower chair however it is not currently in the shower.  Pt reminded of TDWB with mobility.  Pt anticipates d/c home tomorrow and would benefit from HHPT upon d/c.  Pt would benefit from practicing one step prior to d/c.    If plan is discharge home, recommend the following: A little help with walking and/or transfers;A little help with bathing/dressing/bathroom;Assistance with cooking/housework;Assist for transportation;Help with stairs or ramp for entrance   Can travel by private vehicle        Equipment Recommendations  Rolling walker (2 wheels);BSC/3in1    Recommendations for Other Services       Precautions / Restrictions Precautions Precautions: Fall Restrictions Weight Bearing Restrictions Per Provider Order: Yes RLE Weight Bearing Per Provider Order: Touchdown weight bearing     Mobility  Bed Mobility Overal bed mobility: Needs Assistance Bed Mobility: Supine to Sit     Supine to sit:  Supervision, HOB elevated          Transfers Overall transfer level: Needs assistance Equipment used: Rolling walker (2 wheels) Transfers: Sit to/from Stand Sit to Stand: Min assist, Contact guard assist           General transfer comment: verbal cues for UE and LE positioning, max cues for TDWB; assist to rise and stabilize from bed but improved from recliner    Ambulation/Gait Ambulation/Gait assistance: Contact guard assist Gait Distance (Feet): 40 Feet Assistive device: Rolling walker (2 wheels) Gait Pattern/deviations: Step-to pattern Gait velocity: decr     General Gait Details: pt with difficulty TDWB for standing so emphasized more NWB which pt was better able to perform for ambulation, distance limited due to fatigue; pt denies dizziness   Stairs             Wheelchair Mobility     Tilt Bed    Modified Rankin (Stroke Patients Only)       Balance                                            Cognition Arousal: Alert Behavior During Therapy: WFL for tasks assessed/performed Overall Cognitive Status: Within Functional Limits for tasks assessed  Exercises      General Comments        Pertinent Vitals/Pain Pain Assessment Pain Assessment: No/denies pain Pain Intervention(s): Monitored during session, Repositioned    Home Living                          Prior Function            PT Goals (current goals can now be found in the care plan section) Progress towards PT goals: Progressing toward goals    Frequency    7X/week      PT Plan      Co-evaluation              AM-PAC PT 6 Clicks Mobility   Outcome Measure  Help needed turning from your back to your side while in a flat bed without using bedrails?: A Little Help needed moving from lying on your back to sitting on the side of a flat bed without using bedrails?: A Little Help  needed moving to and from a bed to a chair (including a wheelchair)?: A Little Help needed standing up from a chair using your arms (e.g., wheelchair or bedside chair)?: A Little Help needed to walk in hospital room?: A Little Help needed climbing 3-5 steps with a railing? : A Lot 6 Click Score: 17    End of Session Equipment Utilized During Treatment: Gait belt Activity Tolerance: Patient limited by fatigue;Patient tolerated treatment well Patient left: in chair;with call bell/phone within reach (agreeble to call for assist out of recliner) Nurse Communication: Mobility status;Weight bearing status PT Visit Diagnosis: Other abnormalities of gait and mobility (R26.89)     Time: 8688-8672 PT Time Calculation (min) (ACUTE ONLY): 16 min  Charges:    $Gait Training: 8-22 mins PT General Charges $$ ACUTE PT VISIT: 1 Visit                    Tari KLEIN, DPT Physical Therapist Acute Rehabilitation Services Office: [phone]    Tari CROME [place] 10/01/2023, 3:53 PM

## 2023-10-02 DIAGNOSIS — M00051 Staphylococcal arthritis, right hip: Secondary | ICD-10-CM | POA: Diagnosis not present

## 2023-10-02 DIAGNOSIS — M86151 Other acute osteomyelitis, right femur: Secondary | ICD-10-CM | POA: Diagnosis not present

## 2023-10-02 LAB — CBC
HCT: 25.6 % — ABNORMAL LOW (ref 36.0–46.0)
Hemoglobin: 8.1 g/dL — ABNORMAL LOW (ref 12.0–15.0)
MCH: 28.9 pg (ref 26.0–34.0)
MCHC: 31.6 g/dL (ref 30.0–36.0)
MCV: 91.4 fL (ref 80.0–100.0)
Platelets: 404 10*3/uL — ABNORMAL HIGH (ref 150–400)
RBC: 2.8 MIL/uL — ABNORMAL LOW (ref 3.87–5.11)
RDW: 15.1 % (ref 11.5–15.5)
WBC: 13 10*3/uL — ABNORMAL HIGH (ref 4.0–10.5)
nRBC: 0 % (ref 0.0–0.2)

## 2023-10-02 LAB — TYPE AND SCREEN
ABO/RH(D): O POS
Antibody Screen: NEGATIVE
Unit division: 0
Unit division: 0

## 2023-10-02 LAB — BPAM RBC
Blood Product Expiration Date: 202503102359
Blood Product Expiration Date: 202503102359
ISSUE DATE / TIME: 202502070000
ISSUE DATE / TIME: 202502081432
Unit Type and Rh: 202503102359
Unit Type and Rh: 5100
Unit Type and Rh: 5100

## 2023-10-02 MED ORDER — ASPIRIN 81 MG PO CHEW: 81.0000 mg | CHEWABLE_TABLET | Freq: Two times a day (BID) | ORAL | 0 refills | Status: AC

## 2023-10-02 MED ORDER — PANTOPRAZOLE SODIUM 40 MG PO TBEC
40.0000 mg | DELAYED_RELEASE_TABLET | Freq: Every day | ORAL | 0 refills | Status: DC
Start: 2023-10-02 — End: 2024-01-03

## 2023-10-02 MED ORDER — BISACODYL 10 MG RE SUPP
10.0000 mg | Freq: Once | RECTAL | Status: AC
  Administered 2023-10-02: 10 mg via RECTAL
  Filled 2023-10-02: qty 1

## 2023-10-02 MED ORDER — POLYETHYLENE GLYCOL 3350 17 G PO PACK
17.0000 g | PACK | Freq: Two times a day (BID) | ORAL | Status: DC
  Administered 2023-10-02: 17 g via ORAL
  Filled 2023-10-02: qty 1

## 2023-10-02 MED ORDER — SODIUM CHLORIDE 0.9 % IV BOLUS
1000.0000 mL | Freq: Once | INTRAVENOUS | Status: AC
  Administered 2023-10-02: 1000 mL via INTRAVENOUS

## 2023-10-02 MED ORDER — CEFAZOLIN IV (FOR PTA / DISCHARGE USE ONLY): 2.0000 g | Freq: Three times a day (TID) | INTRAVENOUS | 0 refills | Status: AC

## 2023-10-02 MED ORDER — POLYETHYLENE GLYCOL 3350 17 G PO PACK: 17.0000 g | PACK | Freq: Every day | ORAL | 0 refills | Status: DC | PRN

## 2023-10-02 MED ORDER — HEPARIN SOD (PORK) LOCK FLUSH 100 UNIT/ML IV SOLN
250.0000 [IU] | INTRAVENOUS | Status: AC | PRN
  Administered 2023-10-02: 250 [IU]

## 2023-10-02 MED ORDER — FERROUS FUMARATE 324 (106 FE) MG PO TABS: 1.0000 | ORAL_TABLET | Freq: Every day | ORAL | 0 refills | Status: DC

## 2023-10-02 NOTE — Discharge Summary (Signed)
 Physician Discharge Summary  Melanie Li WGN:[id number] DOB: [birth year] DOA: 09/22/2023  PCP: Christel Cousins, MD  Admit date: 09/22/2023 Discharge date: 10/02/2023 Recommendations for Outpatient Follow-up:  Follow up with PCP in 1 weeks-call for appointment Please obtain BMP/CBC in one week Follow-up with ID regarding weekly labs and IV antibiotics  Discharge Dispo: HOME W/ [facility] Discharge Condition: Stable Code Status:   Code Status: Full Code Diet recommendation:  Diet Order             Diet regular Room service appropriate? Yes; Fluid consistency: Thin  Diet effective now                    Brief/Interim Summary: 58 year old with hypothyroidism who had undergone corticosteroid injection after an injury while playing pickle ball but after initial benefit having worsening pain, difficult to ambulate, had MRI of the spine and MRI of the hip that showed septic arthritis with a large joint fluid collection and sent to ED by Dr Charol Copas due to concern about her septic hip pain desired to have this aspirated and to have antibiotics started.  She is having right hip pain for nearly 1-1/48-month refractory to medications and intra-articular injection. S/p IR guided joint aspiration in 2/1, ID has been consulted, blood culture positive for MSSA and Gram stain on fluid GPC. 2D echocardiogram does not show evidence of endocarditis repeat blood cultures 09/24/23: no growth so far 2/5: s/p right hip girdlestone procedure,placement of articulating antibiotic spacer. Rt fem hear and tissues sent for culture Patient remains symptomatic with anemia/ABLA received total 2 units PRBC and hemoglobin stabilized IntraOp culture from 2/5 no growth at 5 days.  Blood culture from 2/2 no growth so far At this time plan is for discharge home on IV antibiotics with PICC line with home health. During PT 2/10 am-she was orthostatic and symptomatic and 1 l NS Bolus given and planned to recheck orthostatic and PT  session to ensure patient is stable for discharge home as she is anxious and eager to go home.  On recheck PTC send patient did well remained asymptomatic and orthostatic vitals negative    Discharge Diagnoses:  Principal Problem:   Septic arthritis (HCC) Active Problems:   Hypothyroidism (acquired)   Pain   MSSA bacteremia   Left hip pain   Right hip pain   Encounter for hepatitis C screening test for low risk patient   Acute osteomyelitis involving pelvic region and thigh, right (HCC)   Osteomyelitis of right femur (HCC)  Right hip MSSA septic arthritis with destruction of cartilage and bone MSSA bacteremia due to above: Blood cx 1/31 mssa. S/p IR guided joint aspiration on 2/1-purulent fluid was obtained and culture- gram stain- GPC In cluster cultures no anaerobes isolated.TTE 09/24/23-EF 60 to 65%, G1 DD, mitral valve normal aortic valve tricuspid cannot exclude small PFO 2/5: s/p right hip girdlestone procedure,placement of articulating antibiotic spacer Rt fem headand tissues sent for culture- gram stain no organism-IntraOp INTRA-OP culture NGTD and in progress x 5 days Repeat blood culture from  2/2 NGTD s/p PICC and planning for 6 wk  of Ancef  as per  ID At this time mediICALLY stable for discharge to home with home health  Discussed /w otho PA and advised dc on asa 81mg  bid x 6 wk They will arrange for outpatient follow-up.  Hypothyroidism:  Continue home levothyroxine    ABLA Acute anemia-likely from acute illness Likely in the setting of acute illness septic arthritis -also had 500 cc  blood loss in surgery Hb a month ago was 12.6 on admission 9.2 and downtrending So far total 2 units PRBC transfusion hemoglobin appropriately increased > 8 GM-follow-up CBC in 1 week. Iron panel suggesting iron deficiency.cont iron supplement Denies melena.She had colonoscopy >10 yrs ago at [place]  that was negative. Recent Labs    09/23/23 1300 09/23/23 1508 09/24/23 0557  09/29/23 0800 09/29/23 1637 09/30/23 0659 10/01/23 0655 10/02/23 0300  HGB 9.2*  --    < > 7.3* 7.4* 7.3* 8.7* 8.1*  MCV  --   --    < > 94.0  --  94.3 92.5 91.4  FERRITIN  --  903*  --   --   --   --   --   --   TIBC 130*  --   --   --   --   --   --   --   IRON 19*  --   --   --   --   --   --   --    < > = values in this interval not displayed.    Mild hyponatremia: Resolved.  Thrombocytosis: Likely reactive.  Downtrending.  Hypokalemia: Resolved.  Constipation: She had bowel movement today. Cont miralax  and stool softener  Deconditioning/debility: Continue home with home health PT OT   Orthostatic hypotension: Likely from her acute  illness poor po intake and  anemia. Did not do well with first PT session- gave 1 liter bolus and doing well on second PT session with blood pressure holding steady 90s to 113 no drop more than 20 points -she ambulated well without dizziness.  She is requesting for discharge home today  Encouraged oral intake  Consults: Infectious disease Orthopedics Subjective: Alert awake oriented resting comfortably some constipation  Discharge Exam: Vitals:   10/02/23 0449 10/02/23 1356  BP: 110/77 104/66  Pulse: 90 97  Resp: 16 18  Temp: 98.4 F (36.9 C) 98.2 F (36.8 C)  SpO2: 99% 100%   General: Pt is alert, awake, not in acute distress Cardiovascular: RRR, S1/S2 +, no rubs, no gallops Respiratory: CTA bilaterally, no wheezing, no rhonchi Abdominal: Soft, NT, ND, bowel sounds + Extremities: no edema, no cyanosis  Discharge Instructions  Discharge Instructions     [facility] pharmacist to adjust dose for Vancomycin , Aminoglycosides and other anti-infective therapies as requested by physician.   Complete by: As directed    [facility] to provide Cath Flo 2mg    Complete by: As directed    Administer for PICC line occlusion and as ordered by physician for other access device issues.   Anaphylaxis Kit: Provided  to treat any anaphylactic reaction to the medication being provided to the patient if First Dose or when requested by physician   Complete by: As directed    Epinephrine  1mg /ml vial / amp: Administer 0.3mg  (0.73ml) subcutaneously once for moderate to severe anaphylaxis, nurse to call physician and pharmacy when reaction occurs and call 911 if needed for immediate care   Diphenhydramine  50mg /ml IV vial: Administer 25-50mg  IV/IM PRN for first dose reaction, rash, itching, mild reaction, nurse to call physician and pharmacy when reaction occurs   Sodium Chloride  0.9% NS 500ml IV: Administer if needed for hypovolemic blood pressure drop or as ordered by physician after call to physician with anaphylactic reaction   Change dressing on IV access line weekly and PRN   Complete by: As directed    Discharge instructions   Complete by: As directed  Please call call MD or return to ER for similar or worsening recurring problem that brought you to hospital or if any fever,nausea/vomiting,abdominal pain, uncontrolled pain, chest pain,  shortness of breath or any other alarming symptoms.  Please follow-up your doctor as instructed in a week time and call the office for appointment.  Please avoid alcohol , smoking, or any other illicit substance and maintain healthy habits including taking your regular medications as prescribed.  You were cared for by a hospitalist during your hospital stay. If you have any questions about your discharge medications or the care you received while you were in the hospital after you are discharged, you can call the unit and ask to speak with the hospitalist on call if the hospitalist that took care of you is not available.  Once you are discharged, your primary care physician will handle any further medical issues. Please note that NO REFILLS for any discharge medications will be authorized once you are discharged, as it is imperative that you return to your primary care physician  (or establish a relationship with a primary care physician if you do not have one) for your aftercare needs so that they can reassess your need for medications and monitor your lab values   Flush IV access with Sodium Chloride  0.9% and Heparin  10 units/ml or 100 units/ml   Complete by: As directed    Home infusion instructions - [facility]   Complete by: As directed    Instructions: Flush IV access with Sodium Chloride  0.9% and Heparin  10units/ml or 100units/ml   Change dressing on IV access line: Weekly and PRN   Instructions Cath Flo 2mg : Administer for PICC Line occlusion and as ordered by physician for other access device   [facility] pharmacist to adjust dose for: Vancomycin , Aminoglycosides and other anti-infective therapies as requested by physician   Increase activity slowly   Complete by: As directed    Method of administration may be changed at the discretion of home infusion pharmacist based upon assessment of the patient and/or caregiver's ability to self-administer the medication ordered   Complete by: As directed       Allergies as of 10/02/2023       Reactions   Sulfa Antibiotics Hives, Rash, Other (See Comments)   "Rash covered the entire body"   Oxycodone  Other (See Comments)   "Very INEFFECTIVE"   Conjugated Estrogens Rash        Medication List     STOP taking these medications    meloxicam 7.5 MG tablet Commonly known as: MOBIC   naproxen  500 MG tablet Commonly known as: NAPROSYN    oxyCODONE  5 MG immediate release tablet Commonly known as: Roxicodone        TAKE these medications    aspirin  81 MG chewable tablet Chew 1 tablet (81 mg total) by mouth 2 (two) times daily.   ceFAZolin  IVPB Commonly known as: ANCEF  Inject 2 g into the vein every 8 (eight) hours. Indication:  MSSA bacteremia/septic arthritis  First Dose: Yes Last Day of Therapy:  11/07/2023  Labs - Once weekly:  CBC/D and BMP, Labs - Once weekly: ESR and  CRP Method of administration: IV Push Method of administration may be changed at the discretion of home infusion pharmacist based upon assessment of the patient and/or caregiver's ability to self-administer the medication ordered.   cyclobenzaprine  10 MG tablet Commonly known as: FLEXERIL  Take 1 tablet (10 mg total) by mouth at bedtime.   Ferrous Fumarate  324 (106  Fe) MG Tabs tablet Commonly known as: HEMOCYTE - 106 mg FE Take 1 tablet (106 mg of iron total) by mouth daily. Start taking on: October 03, 2023   gabapentin  300 MG capsule Commonly known as: NEURONTIN  Take 300 mg by mouth at bedtime.   levothyroxine  100 MCG tablet Commonly known as: SYNTHROID  Take 100 mcg by mouth daily before breakfast.   morphine  15 MG tablet Commonly known as: MSIR Take 15 mg by mouth every 8 (eight) hours as needed for moderate pain (pain score 4-6) or severe pain (pain score 7-10).   pantoprazole  40 MG tablet Commonly known as: Protonix  Take 1 tablet (40 mg total) by mouth daily.   polyethylene glycol 17 g packet Commonly known as: MIRALAX  / GLYCOLAX  Take 17 g by mouth daily as needed for mild constipation.   TYLENOL  500 MG tablet Generic drug: acetaminophen  Take 1,500 mg by mouth 2 (two) times daily.               Durable Medical Equipment  (From admission, onward)           Start     Ordered   09/28/23 1106  For home use only DME Walker rolling  Once       Question Answer Comment  Walker: With 5 Inch Wheels   Patient needs a walker to treat with the following condition Septic arthritis (HCC)      09/28/23 1105              Discharge Care Instructions  (From admission, onward)           Start     Ordered   10/02/23 0000  Change dressing on IV access line weekly and PRN  (Home infusion instructions - [facility] )        10/02/23 0953            Follow-up Information     Harman Lightning, PA-C. Schedule an appointment as soon as possible  for a visit in 2 week(s).   Specialty: Orthopedic Surgery Why: For suture removal, For wound re-check Contact information: [address] [place] [address] [phone]                Allergies  Allergen Reactions   Sulfa Antibiotics Hives, Rash and Other (See Comments)    "Rash covered the entire body"   Oxycodone  Other (See Comments)    "Very INEFFECTIVE"   Conjugated Estrogens Rash   The results of significant diagnostics from this hospitalization (including imaging, microbiology, ancillary and laboratory) are listed below for reference.    Microbiology: Recent Results (from the past 240 hours)  Blood culture (routine x 2)     Status: Abnormal   Collection Time: 09/22/23  4:50 PM   Specimen: BLOOD  Result Value Ref Range Status   Specimen Description   Final    BLOOD LEFT ANTECUBITAL Performed at [facility], [address], [place], [place] [address]    Special Requests   Final    BOTTLES DRAWN AEROBIC AND ANAEROBIC Blood Culture adequate volume Performed at [facility], [address] [place], [place] [address]    Culture  Setup Time   Final    GRAM POSITIVE COCCI IN CLUSTERS IN BOTH AEROBIC AND ANAEROBIC BOTTLES CRITICAL RESULT CALLED TO, READ BACK BY AND VERIFIED WITH: Drexel Gentles SWAYNE [id number] AT 1134 BY EC Performed at [facility], [address], [place], [place]  [address]    Culture STAPHYLOCOCCUS AUREUS (A)  Final   Report Status 09/25/2023 FINAL  Final   Organism ID, Bacteria STAPHYLOCOCCUS AUREUS  Final      Susceptibility   Staphylococcus aureus - MIC*    CIPROFLOXACIN <=0.5 SENSITIVE Sensitive     ERYTHROMYCIN <=0.25 SENSITIVE Sensitive     GENTAMICIN <=0.5 SENSITIVE Sensitive     OXACILLIN 0.5 SENSITIVE Sensitive     TETRACYCLINE <=1 SENSITIVE Sensitive     VANCOMYCIN  <=0.5 SENSITIVE Sensitive     TRIMETH/SULFA <=10 SENSITIVE Sensitive     CLINDAMYCIN <=0.25 SENSITIVE  Sensitive     RIFAMPIN <=0.5 SENSITIVE Sensitive     Inducible Clindamycin NEGATIVE Sensitive     LINEZOLID 2 SENSITIVE Sensitive     * STAPHYLOCOCCUS AUREUS  Blood Culture ID Panel (Reflexed)     Status: Abnormal   Collection Time: 09/22/23  4:50 PM  Result Value Ref Range Status   Enterococcus faecalis NOT DETECTED NOT DETECTED Final   Enterococcus Faecium NOT DETECTED NOT DETECTED Final   Listeria monocytogenes NOT DETECTED NOT DETECTED Final   Staphylococcus species DETECTED (A) NOT DETECTED Final    Comment: CRITICAL RESULT CALLED TO, READ BACK BY AND VERIFIED WITH: PHARMD MARY SWAYNE [id number] AT 1134 BY EC    Staphylococcus aureus (BCID) DETECTED (A) NOT DETECTED Final    Comment: CRITICAL RESULT CALLED TO, READ BACK BY AND VERIFIED WITH: PHARMD MARY SWAYNE [id number] AT 1134 BY EC    Staphylococcus epidermidis NOT DETECTED NOT DETECTED Final   Staphylococcus lugdunensis NOT DETECTED NOT DETECTED Final   Streptococcus species NOT DETECTED NOT DETECTED Final   Streptococcus agalactiae NOT DETECTED NOT DETECTED Final   Streptococcus pneumoniae NOT DETECTED NOT DETECTED Final   Streptococcus pyogenes NOT DETECTED NOT DETECTED Final   A.calcoaceticus-baumannii NOT DETECTED NOT DETECTED Final   Bacteroides fragilis NOT DETECTED NOT DETECTED Final   Enterobacterales NOT DETECTED NOT DETECTED Final   Enterobacter cloacae complex NOT DETECTED NOT DETECTED Final   Escherichia coli NOT DETECTED NOT DETECTED Final   Klebsiella aerogenes NOT DETECTED NOT DETECTED Final   Klebsiella oxytoca NOT DETECTED NOT DETECTED Final   Klebsiella pneumoniae NOT DETECTED NOT DETECTED Final   Proteus species NOT DETECTED NOT DETECTED Final   Salmonella species NOT DETECTED NOT DETECTED Final   Serratia marcescens NOT DETECTED NOT DETECTED Final   Haemophilus influenzae NOT DETECTED NOT DETECTED Final   Neisseria meningitidis NOT DETECTED NOT DETECTED Final   Pseudomonas aeruginosa NOT DETECTED NOT  DETECTED Final   Stenotrophomonas maltophilia NOT DETECTED NOT DETECTED Final   Candida albicans NOT DETECTED NOT DETECTED Final   Candida auris NOT DETECTED NOT DETECTED Final   Candida glabrata NOT DETECTED NOT DETECTED Final   Candida krusei NOT DETECTED NOT DETECTED Final   Candida parapsilosis NOT DETECTED NOT DETECTED Final   Candida tropicalis NOT DETECTED NOT DETECTED Final   Cryptococcus neoformans/gattii NOT DETECTED NOT DETECTED Final   Meth resistant mecA/C and MREJ NOT DETECTED NOT DETECTED Final    Comment: Performed at [facility], [address], [place], [place] [address]  Blood culture (routine x 2)     Status: Abnormal   Collection Time: 09/22/23  5:07 PM   Specimen: BLOOD  Result Value Ref Range Status   Specimen Description   Final    BLOOD RIGHT ANTECUBITAL Performed at [facility], [address], [place], [place] [address]    Special Requests   Final  BOTTLES DRAWN AEROBIC AND ANAEROBIC Blood Culture adequate volume Performed at [facility], [address], [place], [place] [address]    Culture  Setup Time   Final    GRAM POSITIVE COCCI IN CLUSTERS IN BOTH AEROBIC AND ANAEROBIC BOTTLES CRITICAL VALUE NOTED.  VALUE IS CONSISTENT WITH PREVIOUSLY REPORTED AND CALLED VALUE.    Culture (A)  Final    STAPHYLOCOCCUS AUREUS SUSCEPTIBILITIES PERFORMED ON PREVIOUS CULTURE WITHIN THE LAST 5 DAYS. Performed at [facility], [address], [place], [place] [address]    Report Status 09/25/2023 FINAL  Final  Anaerobic culture w Gram Stain     Status: None   Collection Time: 09/23/23 10:19 AM   Specimen: Joint, Right Hip; Synovial Fluid  Result Value Ref Range Status   Specimen Description   Final    SYNOVIAL Performed at [facility], [address], [place], [place] [address]    Special Requests   Final    NONE RIGHT HIP Performed at [facility], [address],  [place], [place] [address]    Gram Stain   Final    MODERATE WBC PRESENT,BOTH PMN AND MONONUCLEAR FEW GRAM POSITIVE COCCI IN CLUSTERS    Culture   Final    NO ANAEROBES ISOLATED Performed at [facility], [address] [place], [place] [address]    Report Status 09/28/2023 FINAL  Final  Culture, blood (Routine X 2) w Reflex to ID Panel     Status: None   Collection Time: 09/24/23  5:57 AM   Specimen: BLOOD RIGHT ARM  Result Value Ref Range Status   Specimen Description   Final    BLOOD RIGHT ARM Performed at [facility], [address] [place], [place] [address]    Special Requests   Final    BOTTLES DRAWN AEROBIC AND ANAEROBIC Blood Culture results may not be optimal due to an inadequate volume of blood received in culture bottles Performed at [facility], [address] [place], [place] [address]    Culture   Final    NO GROWTH 5 DAYS Performed at [facility], [address], [place], [place] [address]    Report Status 09/29/2023 FINAL  Final  Culture, blood (Routine X 2) w Reflex to ID Panel     Status: None   Collection Time: 09/24/23  6:04 AM   Specimen: BLOOD RIGHT ARM  Result Value Ref Range Status   Specimen Description   Final    BLOOD RIGHT ARM Performed at [facility], [address] [place], [place] [address]    Special Requests   Final    BOTTLES DRAWN AEROBIC AND ANAEROBIC Blood Culture adequate volume Performed at [facility], [address] [place], [place] [address]    Culture   Final    NO GROWTH 5 DAYS Performed at [facility], [address], [place], [place] [address]    Report Status 09/29/2023 FINAL  Final  Aerobic/Anaerobic Culture w Gram Stain (surgical/deep wound)     Status: None (Preliminary result)   Collection Time: 09/27/23  3:05 PM   Specimen: Path Tissue  Result Value Ref Range Status   Specimen Description   Final    TISSUE RIGHT FEMORAL HEAD Performed at  [facility], [address] [place], [place] [address]    Special Requests   Final    NONE Performed  at [facility], [address] [place], [place] [address]    Gram Stain   Final    RARE WBC PRESENT,BOTH PMN AND MONONUCLEAR NO ORGANISMS SEEN    Culture   Final    NO GROWTH 4 DAYS NO ANAEROBES ISOLATED; CULTURE IN PROGRESS FOR 5 DAYS Performed at [facility], [address], [place], [place] [address]    Report Status PENDING  Incomplete    Procedures/Studies: US  EKG SITE RITE Result Date: 09/29/2023 If Site Rite image not attached, placement could not be confirmed due to current cardiac rhythm.  DG Pelvis Portable Result Date: 09/27/2023 CLINICAL DATA:  Septic arthritis EXAM: PORTABLE PELVIS 1-2 VIEWS COMPARISON:  None Available. FINDINGS: Status post placement of antibiotic spacer at the right hip. Surgical drains and skin staples overlie the operative region. IMPRESSION: Status post placement of antibiotic spacer at the right hip. Electronically Signed   By: Juanetta Nordmann M.D.   On: 09/27/2023 22:26   DG HIP UNILAT WITH PELVIS 1V RIGHT Result Date: 09/27/2023 CLINICAL DATA:  Right hip replacement EXAM: DG HIP (WITH OR WITHOUT PELVIS) 1V RIGHT COMPARISON:  None Available. FINDINGS: Nine fluoroscopic images demonstrate placement of a right hip arthroplasty device. IMPRESSION: Intraoperative fluoroscopy. Electronically Signed   By: Juanetta Nordmann M.D.   On: 09/27/2023 22:17   DG C-Arm 1-60 Min-No Report Result Date: 09/27/2023 Fluoroscopy was utilized by the requesting physician.  No radiographic interpretation.   DG C-Arm 1-60 Min-No Report Result Date: 09/27/2023 Fluoroscopy was utilized by the requesting physician.  No radiographic interpretation.   DG C-Arm 1-60 Min-No Report Result Date: 09/27/2023 Fluoroscopy was utilized by the requesting physician.  No radiographic interpretation.   ECHOCARDIOGRAM COMPLETE Result Date: 09/24/2023     ECHOCARDIOGRAM REPORT   Patient Name:   [facility] Date of Exam: 09/24/2023 Medical Rec #:  [id number]       Height:       68.0 in Accession #:    [id number]      Weight:       155.0 lb Date of Birth:  1966-07-09        BSA:          1.834 m Patient Age:    57 years        BP:           118/70 mmHg Patient Gender: F               HR:           72 bpm. Exam Location:  Inpatient Procedure: 2D Echo, Color Doppler and Cardiac Doppler Indications:    Bacteremia R78.81  History:        Patient has no prior history of Echocardiogram examinations.  Sonographer:    Hersey Lorenzo RDCS Referring Phys: [id number] Regino Caprio PAHWANI IMPRESSIONS  1. Left ventricular ejection fraction, by estimation, is 60 to 65%. The left ventricle has normal function. The left ventricle has no regional wall motion abnormalities. There is mild left ventricular hypertrophy. Left ventricular diastolic parameters are consistent with Grade I diastolic dysfunction (impaired relaxation).  2. Right ventricular systolic function is low normal. The right ventricular size is normal. There is normal pulmonary artery systolic pressure. The estimated right ventricular systolic pressure is 17.4 mmHg.  3. A small pericardial effusion is present. The pericardial effusion is localized near the right ventricle.  4. The mitral valve is grossly normal. Trivial mitral valve regurgitation.  5. The aortic valve is  tricuspid. Aortic valve regurgitation is not visualized.  6. The inferior vena cava is normal in size with greater than 50% respiratory variability, suggesting right atrial pressure of 3 mmHg.  7. Cannot exclude a small PFO. Comparison(s): No prior Echocardiogram. Conclusion(s)/Recommendation(s): No evidence of valvular vegetations on this transthoracic echocardiogram. Consider a transesophageal echocardiogram to exclude infective endocarditis if clinically indicated. FINDINGS  Left Ventricle: Left ventricular ejection fraction, by estimation, is 60 to 65%. The  left ventricle has normal function. The left ventricle has no regional wall motion abnormalities. The left ventricular internal cavity size was normal in size. There is  mild left ventricular hypertrophy. Left ventricular diastolic parameters are consistent with Grade I diastolic dysfunction (impaired relaxation). Indeterminate filling pressures. Right Ventricle: The right ventricular size is normal. No increase in right ventricular wall thickness. Right ventricular systolic function is low normal. There is normal pulmonary artery systolic pressure. The tricuspid regurgitant velocity is 1.90 m/s,  and with an assumed right atrial pressure of 3 mmHg, the estimated right ventricular systolic pressure is 17.4 mmHg. Left Atrium: Left atrial size was normal in size. Right Atrium: Right atrial size was normal in size. Pericardium: A small pericardial effusion is present. The pericardial effusion is localized near the right ventricle. Mitral Valve: The mitral valve is grossly normal. Trivial mitral valve regurgitation. Tricuspid Valve: The tricuspid valve is grossly normal. Tricuspid valve regurgitation is mild. Aortic Valve: The aortic valve is tricuspid. Aortic valve regurgitation is not visualized. Pulmonic Valve: The pulmonic valve was not well visualized. Pulmonic valve regurgitation is not visualized. Aorta: The aortic root and ascending aorta are structurally normal, with no evidence of dilitation. Venous: The inferior vena cava is normal in size with greater than 50% respiratory variability, suggesting right atrial pressure of 3 mmHg. IAS/Shunts: The interatrial septum is aneurysmal. Cannot exclude a small PFO.  LEFT VENTRICLE PLAX 2D LVIDd:         4.80 cm Diastology LVIDs:         3.30 cm LV e' medial:    9.57 cm/s LV PW:         1.10 cm LV E/e' medial:  6.5 LV IVS:        1.10 cm LV e' lateral:   10.30 cm/s                        LV E/e' lateral: 6.0  RIGHT VENTRICLE             IVC RV S prime:     10.80 cm/s   IVC diam: 1.60 cm TAPSE (M-mode): 2.3 cm LEFT ATRIUM           Index        RIGHT ATRIUM           Index LA diam:      3.70 cm 2.02 cm/m   RA Area:     16.80 cm LA Vol (A4C): 49.9 ml 27.21 ml/m  RA Volume:   44.90 ml  24.48 ml/m  AORTIC VALVE LVOT Vmax:   72.50 cm/s LVOT Vmean:  50.800 cm/s LVOT VTI:    0.145 m MITRAL VALVE               TRICUSPID VALVE MV Area (PHT): 4.19 cm    TR Peak grad:   14.4 mmHg MV Decel Time: 181 msec    TR Vmax:        190.00 cm/s MV E velocity: 62.10 cm/s MV A  velocity: 69.00 cm/s  SHUNTS MV E/A ratio:  0.90        Systemic VTI: 0.14 m Dinah Franco MD Electronically signed by Dinah Franco MD Signature Date/Time: 09/24/2023/1:41:43 PM    Final    DG FLUORO GUIDED NEEDLE PLC ASPIRATION/INJECTION LOC Result Date: 09/23/2023 INDICATION: 58 year old female with right hip pain due to injury status post intra-articular steroid injection presents with acute right hip pain. MR at OSH raised concern for septic arthritis. Request for fluoro guided right hip aspiration. EXAM: ASPIRATION OF RIGHT HIP JOINT UNDER FLUOROSCOPIC GUIDANCE COMPARISON:  None Available. FLUOROSCOPY TIME:  Radiation Exposure Index:  0.6 mGy COMPLICATIONS: None immediate. PROCEDURE: Informed written consent was obtained from the patient after discussion of the risks, benefits and alternatives to treatment. The patient was placed supine on the fluoroscopy table and the right extremity was placed in a slight degree of internal rotation. The right hip was localized with fluoroscopy. The skin overlying the anterior aspect of the hip was prepped and draped in usual sterile fashion. A 3.5 inch 18 gauge spinal needle was advanced into the hip joint at the lateral aspect of the femoral head-neck junction, after the overlying soft tissues were anesthetized with 1% lidocaine . A fluoroscopic image was saved and sent to PACs. 2 mL of purulent fluid was aspirated without difficulty. The sample was sent for laboratory. The needle was  removed and a dressing was placed. The patient tolerated procedure well without immediate postprocedural complication. IMPRESSION: Successful fluoroscopic guided aspiration of the right hip. This procedure was performed by Marcie Sever, PA-C under the supervision of Elene Griffes, MD. Electronically Signed   By: Elene Griffes M.D.   On: 09/23/2023 10:32   DG Hip Unilat W or Wo Pelvis 2-3 Views Right Result Date: 09/22/2023 CLINICAL DATA:  Hip pain EXAM: DG HIP (WITH OR WITHOUT PELVIS) 3V RIGHT COMPARISON:  None Available. FINDINGS: Severe concentric joint space loss of the right hip. Minimal degenerative changes as well of the right sacroiliac joint. No fracture or dislocation. Preserved bone mineralization. Preserved left hip joint. Presumed vascular calcifications in the pelvis. 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 Additional degenerative changes of the right sacroiliac joint. Electronically Signed   By: Adrianna Horde M.D.   On: 09/22/2023 12:48  Labs: BNP (last 3 results) No results for input(s): "BNP" in the last 8760 hours. Basic Metabolic Panel: Recent Labs  Lab 09/27/23 0548 09/27/23 1801 09/28/23 0616 09/29/23 0540 09/29/23 0800 09/30/23 0659  NA 136 136 132* 128* 138 139  K 3.8 4.0 3.8 3.0* 3.4* 3.5  CL 101  --  96* 88* 103 102  CO2 25  --  26 23 26 27   GLUCOSE 105*  --  108* 569* 95 93  BUN 10  --  13 11 12 11   CREATININE 0.40*  --  0.77 1.51* 0.66 0.65  CALCIUM 8.7*  --  8.3* 7.7* 9.0 9.3  CBC: Recent Labs  Lab 09/29/23 0540 09/29/23 0800 09/29/23 1637 09/30/23 0659 10/01/23 0655 10/02/23 0300  WBC 11.5* 13.3*  --  12.4* 13.3* 13.0*  HGB 6.4* 7.3* 7.4* 7.3* 8.7* 8.1*  HCT 20.4* 23.4* 23.4* 23.3* 27.2* 25.6*  MCV 94.9 94.0  --  94.3 92.5 91.4  PLT 355 447*  --  414* 402* 404*  No results for input(s): "VITAMINB12", "FOLATE", "FERRITIN", "TIBC", "IRON", "RETICCTPCT" in the last 72 hours. Urinalysis    Component Value Date/Time   COLORURINE YELLOW  12/23/2021 1025   APPEARANCEUR CLEAR 12/23/2021  1025   LABSPEC <=1.005 (A) 12/23/2021 1025   PHURINE 7.5 12/23/2021 1025   GLUCOSEU NEGATIVE 12/23/2021 1025   HGBUR NEGATIVE 12/23/2021 1025   BILIRUBINUR NEGATIVE 12/23/2021 1025   KETONESUR NEGATIVE 12/23/2021 1025   UROBILINOGEN 0.2 12/23/2021 1025   NITRITE NEGATIVE 12/23/2021 1025   LEUKOCYTESUR LARGE (A) 12/23/2021 1025   Sepsis Labs Recent Labs  Lab 09/29/23 0800 09/30/23 0659 10/01/23 0655 10/02/23 0300  WBC 13.3* 12.4* 13.3* 13.0*   Microbiology Recent Results (from the past 240 hours)  Blood culture (routine x 2)     Status: Abnormal   Collection Time: 09/22/23  4:50 PM   Specimen: BLOOD  Result Value Ref Range Status   Specimen Description   Final    BLOOD LEFT ANTECUBITAL Performed at [facility], [address], [place], [place] [address]    Special Requests   Final    BOTTLES DRAWN AEROBIC AND ANAEROBIC Blood Culture adequate volume Performed at [facility], [address] [place], [place] [address]    Culture  Setup Time   Final    GRAM POSITIVE COCCI IN CLUSTERS IN BOTH AEROBIC AND ANAEROBIC BOTTLES CRITICAL RESULT CALLED TO, READ BACK BY AND VERIFIED WITH: Drexel Gentles SWAYNE [id number] AT 1134 BY EC Performed at [facility], [address] [place], [place] [address]    Culture STAPHYLOCOCCUS AUREUS (A)  Final   Report Status 09/25/2023 FINAL  Final   Organism ID, Bacteria STAPHYLOCOCCUS AUREUS  Final      Susceptibility   Staphylococcus aureus - MIC*    CIPROFLOXACIN <=0.5 SENSITIVE Sensitive     ERYTHROMYCIN <=0.25 SENSITIVE Sensitive     GENTAMICIN <=0.5 SENSITIVE Sensitive     OXACILLIN 0.5 SENSITIVE Sensitive     TETRACYCLINE <=1 SENSITIVE Sensitive     VANCOMYCIN  <=0.5 SENSITIVE Sensitive     TRIMETH/SULFA <=10 SENSITIVE Sensitive     CLINDAMYCIN <=0.25 SENSITIVE Sensitive     RIFAMPIN <=0.5 SENSITIVE Sensitive     Inducible Clindamycin NEGATIVE  Sensitive     LINEZOLID 2 SENSITIVE Sensitive     * STAPHYLOCOCCUS AUREUS  Blood Culture ID Panel (Reflexed)     Status: Abnormal   Collection Time: 09/22/23  4:50 PM  Result Value Ref Range Status   Enterococcus faecalis NOT DETECTED NOT DETECTED Final   Enterococcus Faecium NOT DETECTED NOT DETECTED Final   Listeria monocytogenes NOT DETECTED NOT DETECTED Final   Staphylococcus species DETECTED (A) NOT DETECTED Final    Comment: CRITICAL RESULT CALLED TO, READ BACK BY AND VERIFIED WITH: PHARMD MARY SWAYNE [id number] AT 1134 BY EC    Staphylococcus aureus (BCID) DETECTED (A) NOT DETECTED Final    Comment: CRITICAL RESULT CALLED TO, READ BACK BY AND VERIFIED WITH: PHARMD MARY SWAYNE [id number] AT 1134 BY EC    Staphylococcus epidermidis NOT DETECTED NOT DETECTED Final   Staphylococcus lugdunensis NOT DETECTED NOT DETECTED Final   Streptococcus species NOT DETECTED NOT DETECTED Final   Streptococcus agalactiae NOT DETECTED NOT DETECTED Final   Streptococcus pneumoniae NOT DETECTED NOT DETECTED Final   Streptococcus pyogenes NOT DETECTED NOT DETECTED Final   A.calcoaceticus-baumannii NOT DETECTED NOT DETECTED Final   Bacteroides fragilis NOT DETECTED NOT DETECTED Final   Enterobacterales NOT DETECTED NOT DETECTED Final   Enterobacter cloacae complex NOT DETECTED NOT DETECTED Final   Escherichia coli NOT DETECTED NOT DETECTED Final   Klebsiella aerogenes NOT DETECTED NOT DETECTED Final   Klebsiella oxytoca NOT DETECTED NOT DETECTED Final  Klebsiella pneumoniae NOT DETECTED NOT DETECTED Final   Proteus species NOT DETECTED NOT DETECTED Final   Salmonella species NOT DETECTED NOT DETECTED Final   Serratia marcescens NOT DETECTED NOT DETECTED Final   Haemophilus influenzae NOT DETECTED NOT DETECTED Final   Neisseria meningitidis NOT DETECTED NOT DETECTED Final   Pseudomonas aeruginosa NOT DETECTED NOT DETECTED Final   Stenotrophomonas maltophilia NOT DETECTED NOT DETECTED Final   Candida  albicans NOT DETECTED NOT DETECTED Final   Candida auris NOT DETECTED NOT DETECTED Final   Candida glabrata NOT DETECTED NOT DETECTED Final   Candida krusei NOT DETECTED NOT DETECTED Final   Candida parapsilosis NOT DETECTED NOT DETECTED Final   Candida tropicalis NOT DETECTED NOT DETECTED Final   Cryptococcus neoformans/gattii NOT DETECTED NOT DETECTED Final   Meth resistant mecA/C and MREJ NOT DETECTED NOT DETECTED Final    Comment: Performed at [facility], [address], [place], [place] [address]  Blood culture (routine x 2)     Status: Abnormal   Collection Time: 09/22/23  5:07 PM   Specimen: BLOOD  Result Value Ref Range Status   Specimen Description   Final    BLOOD RIGHT ANTECUBITAL Performed at [facility], [address], [place], [place] [address]    Special Requests   Final    BOTTLES DRAWN AEROBIC AND ANAEROBIC Blood Culture adequate volume Performed at [facility], [address], [place], [place] [address]    Culture  Setup Time   Final    GRAM POSITIVE COCCI IN CLUSTERS IN BOTH AEROBIC AND ANAEROBIC BOTTLES CRITICAL VALUE NOTED.  VALUE IS CONSISTENT WITH PREVIOUSLY REPORTED AND CALLED VALUE.    Culture (A)  Final    STAPHYLOCOCCUS AUREUS SUSCEPTIBILITIES PERFORMED ON PREVIOUS CULTURE WITHIN THE LAST 5 DAYS. Performed at [facility], [address] [place], [place] [address]    Report Status 09/25/2023 FINAL  Final  Anaerobic culture w Gram Stain     Status: None   Collection Time: 09/23/23 10:19 AM   Specimen: Joint, Right Hip; Synovial Fluid  Result Value Ref Range Status   Specimen Description   Final    SYNOVIAL Performed at [facility], [address], [place], [place] [address]    Special Requests   Final    NONE RIGHT HIP Performed at [facility], [address], [place], [place] [address]    Gram Stain   Final    MODERATE WBC PRESENT,BOTH PMN AND  MONONUCLEAR FEW GRAM POSITIVE COCCI IN CLUSTERS    Culture   Final    NO ANAEROBES ISOLATED Performed at [facility], [address] [place], [place] [address]    Report Status 09/28/2023 FINAL  Final  Culture, blood (Routine X 2) w Reflex to ID Panel     Status: None   Collection Time: 09/24/23  5:57 AM   Specimen: BLOOD RIGHT ARM  Result Value Ref Range Status   Specimen Description   Final    BLOOD RIGHT ARM Performed at [facility], [address], [place], [place] [address]    Special Requests   Final    BOTTLES DRAWN AEROBIC AND ANAEROBIC Blood Culture results may not be optimal due to an inadequate volume of blood received in culture bottles Performed at [facility], [address], [place], [place] [address]    Culture   Final    NO GROWTH 5 DAYS  Performed at [facility], [address], [place], [place] [address]    Report Status 09/29/2023 FINAL  Final  Culture, blood (Routine X 2) w Reflex to ID Panel     Status: None   Collection Time: 09/24/23  6:04 AM   Specimen: BLOOD RIGHT ARM  Result Value Ref Range Status   Specimen Description   Final    BLOOD RIGHT ARM Performed at [facility], [address] [place], [place] [address]    Special Requests   Final    BOTTLES DRAWN AEROBIC AND ANAEROBIC Blood Culture adequate volume Performed at [facility], [address] [place], [place] [address]    Culture   Final    NO GROWTH 5 DAYS Performed at [facility], [address] [place], [place] [address]    Report Status 09/29/2023 FINAL  Final  Aerobic/Anaerobic Culture w Gram Stain (surgical/deep wound)     Status: None (Preliminary result)   Collection Time: 09/27/23  3:05 PM   Specimen: Path Tissue  Result Value Ref Range Status   Specimen Description   Final    TISSUE RIGHT FEMORAL HEAD Performed at [facility], [address], [place], [place] [address]     Special Requests   Final    NONE Performed at [facility], [address] [place], [place] [address]    Gram Stain   Final    RARE WBC PRESENT,BOTH PMN AND MONONUCLEAR NO ORGANISMS SEEN    Culture   Final    NO GROWTH 4 DAYS NO ANAEROBES ISOLATED; CULTURE IN PROGRESS FOR 5 DAYS Performed at [facility], [address], [place], [place] [address]    Report Status PENDING  Incomplete  Time coordinating discharge: 35 minutes SIGNED: Lesa Rape, MD  [facility] 10/02/2023, 3:07 PM  If 7PM-7AM, please contact night-coverage [email or web address]

## 2023-10-02 NOTE — Plan of Care (Signed)

## 2023-10-02 NOTE — Progress Notes (Addendum)
 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 Pt is currently R LE TDWB with RW.  Pt hospitalization complicated due to low HgB requiring transfusion 2/6 and 2/8.  Pt PMH includes but is not limited to: tachycardia and thyroid  dz.    PT Comments  Pt received bolus of saline since morning session and with improved tolerance for activity.  Her orthostatic BP were negative and she was asymptomatic.  She was able to ambulate 72' and perform stairs similar to home set up.  Did well maintaining TDWB.  Spouse present during session. Provided HEP handout this am and plan is for HHPT at d/c.  Spouse and pt with no further questions at this time.  Notified RN and MD of negative orthostatic BP.   Orthostatic Vitals: Supine 107/70 , HR 92 Sitting 105/76, HR 113 Sitting 3 min 104/71 , HR 94 Standing 90/74, HR 120; asymptomatic Standing 3 mins 110/76 , HR 122 Post walk 111/71, HR 115    If plan is discharge home, recommend the following: A little help with walking and/or transfers;A little help with bathing/dressing/bathroom;Assistance with cooking/housework;Assist for transportation;Help with stairs or ramp for entrance   Can travel by private vehicle        Equipment Recommendations  Rolling walker (2 wheels);BSC/3in1    Recommendations for Other Services       Precautions / Restrictions Precautions Precautions: Fall Restrictions RLE Weight Bearing Per Provider Order: Touchdown weight bearing     Mobility  Bed Mobility Overal bed mobility: Needs Assistance Bed Mobility: Supine to Sit, Sit to Supine     Supine to sit: Supervision, HOB elevated Sit to  supine: Supervision, HOB elevated   General bed mobility comments: uses UE to assist R LE    Transfers Overall transfer level: Needs assistance Equipment used: Rolling walker (2 wheels) Transfers: Sit to/from Stand Sit to Stand: Contact guard assist           General transfer comment: CGA for safety; cues for R LE management with sitting    Ambulation/Gait Ambulation/Gait assistance: Contact guard assist Gait Distance (Feet): 50 Feet Assistive device: Rolling walker (2 wheels) Gait Pattern/deviations: Step-to pattern Gait velocity: decreased     General Gait Details: Pt tends to do more NWB with gait. Steady wtih RW.  CGA for safety   Stairs Stairs: Yes Stairs assistance: Contact guard assist Stair Management: Step to pattern, Backwards, With walker Number of Stairs: 1 General stair comments: 1, 8" step with CGA ; cues for RW placement and guarding from spouse; spouse present   Wheelchair Mobility     Tilt Bed    Modified Rankin (Stroke Patients Only)       Balance Overall balance assessment: Needs assistance Sitting-balance support: No upper extremity supported Sitting balance-Leahy Scale: Good     Standing balance support: Bilateral upper extremity supported, Reliant on assistive device for balance Standing balance-Leahy Scale: Poor Standing balance comment: Steady with RW                            Cognition Arousal: Alert Behavior During Therapy: WFL for tasks assessed/performed Overall Cognitive Status: Within Functional Limits for tasks assessed  Exercises General Exercises - Lower Extremity Ankle Circles/Pumps: AROM, Both, 10 reps, Seated Long Arc Quad: AROM, Both, 10 reps, Seated Other Exercises Other Exercises: UE: shoulder press x 10 for blood flow Other Exercises: Standing x 10 : very minimal ROM hip abd and H-S curl on R; very minimal ROM L knee flex/mini squat; using  RW    General Comments   Did educate pt and spouse on orthostatic BP prevention (hydrated, slow transitions, AROM exercises for blood flow) and orthostatic safety (return to sitting then supine if lightheaded, elevating legs if needed).       Pertinent Vitals/Pain Pain Assessment Pain Assessment: Faces Faces Pain Scale: Hurts a little bit Pain Location: R LE and hip Pain Descriptors / Indicators: Discomfort, Sore Pain Intervention(s): Ice applied, Limited activity within patient's tolerance, Monitored during session    Home Living                          Prior Function            PT Goals (current goals can now be found in the care plan section) Progress towards PT goals: Progressing toward goals    Frequency    7X/week      PT Plan      Co-evaluation              AM-PAC PT "6 Clicks" Mobility   Outcome Measure  Help needed turning from your back to your side while in a flat bed without using bedrails?: A Little Help needed moving from lying on your back to sitting on the side of a flat bed without using bedrails?: A Little Help needed moving to and from a bed to a chair (including a wheelchair)?: A Little Help needed standing up from a chair using your arms (e.g., wheelchair or bedside chair)?: A Little Help needed to walk in hospital room?: A Little Help needed climbing 3-5 steps with a railing? : A Little 6 Click Score: 18    End of Session Equipment Utilized During Treatment: Gait belt Activity Tolerance: Patient tolerated treatment well Patient left: in bed;with call bell/phone within reach;with family/visitor present Nurse Communication: Mobility status PT Visit Diagnosis: Other abnormalities of gait and mobility (R26.89) Pain - Right/Left: Right Pain - part of body: Hip;Leg     Time: 1610-9604 PT Time Calculation (min) (ACUTE ONLY): 24 min  Charges:    $Gait Training: 8-22 mins $Therapeutic Exercise: 8-22 mins PT General  Charges $$ ACUTE PT VISIT: 1 Visit                     Cyd Dowse, PT Acute Rehab Services [facility] [phone]    Carolynn Citrin 10/02/2023, 3:00 PM

## 2023-10-02 NOTE — TOC Transition Note (Signed)
 Transition of Care [facility]) - Discharge Note   Patient Details  Name: Melanie Li MRN: [id number] Date of Birth: 03/17/1966  [facility] CM/SW Contact:  Amaryllis Junior, LCSW Phone Number: 10/02/2023, 2:51 PM   Clinical Narrative:     Pt received RW delivered to room by [employer]. HHRN orders in place. HHRN services through [facility] and IV ABX w/ [facility]. No further TOC needs.  Final next level of care: Home w Home Health Services Barriers to Discharge: No Barriers Identified   Patient Goals and CMS Choice Patient states their goals for this hospitalization and ongoing recovery are:: return home   Choice offered to / list presented to : NA [place] ownership interest in [facility].provided to::  (NA)    Discharge Placement                       Discharge Plan and Services Additional resources added to the After Visit Summary for                  DME Arranged: Walker rolling DME Agency: [employer] Date DME Agency Contacted: 10/02/23 Time DME Agency Contacted: 1030 Representative spoke with at DME Agency: Zula Hitch HH Arranged: RN HH Agency: [facility] Date HH Agency Contacted: 09/29/23 Time HH Agency Contacted: 1530 Representative spoke with at [facility] Agency: Renetta Carter  Social Drivers of Health (SDOH) Interventions SDOH Screenings   Food Insecurity: No Food Insecurity (09/23/2023)  Housing: Low Risk  (09/23/2023)  Transportation Needs: No Transportation Needs (09/23/2023)  Utilities: Not At Risk (09/23/2023)  Depression (PHQ2-9): Low Risk  (09/29/2021)  Tobacco Use: Low Risk  (09/27/2023)     Readmission Risk Interventions    09/25/2023   10:27 AM  Readmission Risk Prevention Plan  Post Dischage Appt Complete  Medication Screening Complete  Transportation Screening Complete

## 2023-10-02 NOTE — Progress Notes (Signed)
 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 Pt is currently R LE TDWB with RW.  Pt hospitalization complicated due to low HgB requiring transfusion 2/6 and 2/8.  Pt PMH includes but is not limited to: tachycardia and thyroid  dz.    PT Comments  Pt eager to participate with therapy and progressed to step but then felt lightheaded and was found to be orthostatic.  Notified RN and MD.  Will f/u as able and continue to progress.  Pt walked to step and performed, became lightheaded so had her step down and return to bed.  BP was 101/87 with HR 106 bpm.  Had pt sit 5-10 mins, performed sitting exercises, BP then 103/87 with HR 109 bpm.  She was feeling some better so attempted ambulation again but became lightheaded again and BP was 97/76 with return to sitting EOB.  After pt recovered, had her stand for BP and was 79/69 with HR 125 bpm, standing 2 mins (unable to tolerate 3) 85/60 with HR 129 bpm.  Return to supine 110/76 with HR 83.    If plan is discharge home, recommend the following: A little help with walking and/or transfers;A little help with bathing/dressing/bathroom;Assistance with cooking/housework;Assist for transportation;Help with stairs or ramp for entrance   Can travel by private vehicle        Equipment Recommendations  Rolling walker (2 wheels);BSC/3in1    Recommendations for Other Services       Precautions / Restrictions Precautions Precautions: Fall Restrictions Weight Bearing Restrictions Per Provider Order: No RLE Weight Bearing Per Provider Order: Touchdown weight bearing     Mobility  Bed Mobility Overal bed mobility: Needs  Assistance Bed Mobility: Supine to Sit, Sit to Supine     Supine to sit: Supervision, HOB elevated Sit to supine: Supervision, HOB elevated   General bed mobility comments: uses UE to assist R LE    Transfers Overall transfer level: Needs assistance Equipment used: Rolling walker (2 wheels) Transfers: Sit to/from Stand Sit to Stand: Contact guard assist           General transfer comment: Cues for hand placement; performed x 3; did well with TDWB    Ambulation/Gait Ambulation/Gait assistance: Contact guard assist Gait Distance (Feet): 30 Feet (30'x2) Assistive device: Rolling walker (2 wheels)   Gait velocity: decreased     General Gait Details: Pt tends to do more NWB with gait.  Distance limited due to lightheaded/orthostatic   Stairs Stairs: Yes Stairs assistance: Min assist Stair Management: Step to pattern, Backwards, With walker Number of Stairs: 1 General stair comments: 1, 8" step with CGA up and min A to stabilize down. Pt felt lightheaded when up on step, quickly came down and returned to bed.   Wheelchair Mobility     Tilt Bed    Modified Rankin (Stroke Patients Only)       Balance Overall balance assessment: Needs assistance Sitting-balance support: No upper extremity supported Sitting balance-Leahy Scale: Good     Standing balance support: Bilateral upper extremity supported, Reliant on assistive device for balance Standing balance-Leahy Scale: Poor Standing balance comment: Steady with RW  Cognition Arousal: Alert Behavior During Therapy: WFL for tasks assessed/performed Overall Cognitive Status: Within Functional Limits for tasks assessed                                          Exercises General Exercises - Lower Extremity Ankle Circles/Pumps: AROM, Both, 10 reps, Seated Long Arc Quad: AROM, Both, 10 reps, Seated (cues to get R LE fully supported) Other Exercises Other  Exercises: UE: shoulder press x 10 for blood flow    General Comments        Pertinent Vitals/Pain Pain Assessment Pain Assessment: Faces Faces Pain Scale: Hurts a little bit Pain Location: R LE and hip Pain Descriptors / Indicators: Discomfort, Sore Pain Intervention(s): Limited activity within patient's tolerance, Monitored during session, Repositioned, Premedicated before session    Home Living                          Prior Function            PT Goals (current goals can now be found in the care plan section) Progress towards PT goals: Progressing toward goals    Frequency    7X/week      PT Plan      Co-evaluation              AM-PAC PT "6 Clicks" Mobility   Outcome Measure  Help needed turning from your back to your side while in a flat bed without using bedrails?: A Little Help needed moving from lying on your back to sitting on the side of a flat bed without using bedrails?: A Little Help needed moving to and from a bed to a chair (including a wheelchair)?: A Little Help needed standing up from a chair using your arms (e.g., wheelchair or bedside chair)?: A Little Help needed to walk in hospital room?: A Little Help needed climbing 3-5 steps with a railing? : A Little 6 Click Score: 18    End of Session Equipment Utilized During Treatment: Gait belt Activity Tolerance: Treatment limited secondary to medical complications (Comment) Patient left: in bed;with call bell/phone within reach;with family/visitor present Nurse Communication: Mobility status PT Visit Diagnosis: Other abnormalities of gait and mobility (R26.89) Pain - Right/Left: Right Pain - part of body: Hip;Leg     Time: 1020-1110 PT Time Calculation (min) (ACUTE ONLY): 50 min  Charges:    $Gait Training: 8-22 mins $Therapeutic Exercise: 8-22 mins $Therapeutic Activity: 8-22 mins PT General Charges $$ ACUTE PT VISIT: 1 Visit                     Cyd Dowse, PT Acute  Rehab Services [facility] [phone]    Carolynn Citrin 10/02/2023, 11:17 AM

## 2023-10-03 LAB — AEROBIC/ANAEROBIC CULTURE W GRAM STAIN (SURGICAL/DEEP WOUND): Culture: NO GROWTH

## 2023-10-05 ENCOUNTER — Encounter: Payer: Self-pay | Admitting: Infectious Disease

## 2023-10-07 DIAGNOSIS — M86151 Other acute osteomyelitis, right femur: Secondary | ICD-10-CM | POA: Diagnosis not present

## 2023-10-09 DIAGNOSIS — Z792 Long term (current) use of antibiotics: Secondary | ICD-10-CM | POA: Diagnosis not present

## 2023-10-14 DIAGNOSIS — M86151 Other acute osteomyelitis, right femur: Secondary | ICD-10-CM | POA: Diagnosis not present

## 2023-10-15 ENCOUNTER — Encounter: Payer: Self-pay | Admitting: Infectious Disease

## 2023-10-16 ENCOUNTER — Ambulatory Visit (HOSPITAL_COMMUNITY)
Admission: RE | Admit: 2023-10-16 | Discharge: 2023-10-16 | Disposition: A | Discharge status: Home / Self Care | Payer: 59 | Source: Ambulatory Visit | Attending: Student | Admitting: Student

## 2023-10-16 ENCOUNTER — Other Ambulatory Visit (HOSPITAL_COMMUNITY): Payer: Self-pay | Admitting: Student

## 2023-10-16 DIAGNOSIS — M009 Pyogenic arthritis, unspecified: Secondary | ICD-10-CM | POA: Diagnosis not present

## 2023-10-16 DIAGNOSIS — M533 Sacrococcygeal disorders, not elsewhere classified: Secondary | ICD-10-CM | POA: Insufficient documentation

## 2023-10-16 DIAGNOSIS — M86151 Other acute osteomyelitis, right femur: Secondary | ICD-10-CM | POA: Diagnosis not present

## 2023-10-16 DIAGNOSIS — Z792 Long term (current) use of antibiotics: Secondary | ICD-10-CM | POA: Diagnosis not present

## 2023-10-16 DIAGNOSIS — M545 Low back pain, unspecified: Secondary | ICD-10-CM | POA: Diagnosis not present

## 2023-10-16 DIAGNOSIS — R7881 Bacteremia: Secondary | ICD-10-CM | POA: Diagnosis not present

## 2023-10-16 DIAGNOSIS — M00051 Staphylococcal arthritis, right hip: Secondary | ICD-10-CM | POA: Diagnosis not present

## 2023-10-16 DIAGNOSIS — D259 Leiomyoma of uterus, unspecified: Secondary | ICD-10-CM | POA: Diagnosis not present

## 2023-10-17 ENCOUNTER — Ambulatory Visit: Payer: Self-pay | Admitting: Family Medicine

## 2023-10-17 LAB — LAB REPORT - SCANNED: Calcium: 8.9

## 2023-10-17 NOTE — Telephone Encounter (Signed)
 Chief Complaint: constipation Symptoms: constipation, abdominal pain Frequency: last bowel movement was Friday 2/21 Pertinent Negatives: Patient denies fever, nausea, vomiting, back/chest pain, diarrhea, bloody stools Disposition: [] ED /[] Urgent Care (no appt availability in office) / [x] Appointment(In office/virtual)/ []  [facility]/ [] Home Care/ [] Refused Recommended Disposition /[]  Mobile Bus/ []  Follow-up with PCP Additional Notes: Pt reports constipation with abdominal discomfort. Pt reports her last bowel movement was Friday 2/21. Pt discharged from the hospital on 2/10 dx with osteomyelitis. Currently taking antibiotics via a PICC line. Pt reports her bowel movements before last Friday were normal although she did state they were dark, but not black or tarry. Pt reports 2/10 abdominal discomfort. Pt also unable to walk at this time so her mobility is limited. Taking Miralax daily with no effect. Pt's husband went out and bought patient a magnesium citrate laxative. RN advised pt should also be seen in the office. Pt states that the abd pain is from constipation and she will feel better if she has a bowel movement. RN advised pt she should also be seen by a HCP to ensure it is nothing more serious given her medical history. Pt agreeable to do that. RN scheduled appt for tomorrow at 0945. RN advised pt she needs to call us back or go to the ED if she begins vomiting or is unable to keep anything down, if she develops a fever, or anything worsening, to which she verbalized understanding.  Copied from CRM [id number]. Topic: Clinical - Medical Advice >> Oct 17, 2023  3:42 PM Alcus Dad wrote: Reason for CRM: Patient stated that she has been constipated for two days and have not had a bowel movement in 5 days and is in a lot of discomfort. He stomach is upset. He can barely sit down. She doesn't know what to do Reason for Disposition  [1] MILD pain (e.g., does not interfere with  normal activities) AND [2] pain comes and goes (cramps) AND [3] present > 48 hours  (Exception: This same abdominal pain is a chronic symptom recurrent or ongoing AND present > 4 weeks.)  Answer Assessment - Initial Assessment Questions 1. LOCATION: "Where does it hurt?"      In the center  2. RADIATION: "Does the pain shoot anywhere else?" (e.g., chest, back)     No 3. ONSET: "When did the pain begin?" (e.g., minutes, hours or days ago)      Constipation since Last Friday. Pt states she had normal bowel movements Wednesday and Thursday last week. States those Bms were "normal." Pt states those bowel movements were darker, but not tarry and black. Taking antibiotics. 4. SUDDEN: "Gradual or sudden onset?"     Suddenly with constipation 5. PATTERN "Does the pain come and go, or is it constant?"    - If it comes and goes: "How long does it last?" "Do you have pain now?"     (Note: Comes and goes means the pain is intermittent. It goes away completely between bouts.)    - If constant: "Is it getting better, staying the same, or getting worse?"      (Note: Constant means the pain never goes away completely; most serious pain is constant and gets worse.)      Constant  6. SEVERITY: "How bad is the pain?"  (e.g., Scale 1-10; mild, moderate, or severe)    - MILD (1-3): Doesn't interfere with normal activities, abdomen soft and not tender to touch.     - MODERATE (4-7): Interferes  with normal activities or awakens from sleep, abdomen tender to touch.     - SEVERE (8-10): Excruciating pain, doubled over, unable to do any normal activities.       2/10 - discomfort  7. RECURRENT SYMPTOM: "Have you ever had this type of stomach pain before?" If Yes, ask: "When was the last time?" and "What happened that time?"      She has been constipated before  8. CAUSE: "What do you think is causing the stomach pain?"     Constipation 9. RELIEVING/AGGRAVATING FACTORS: "What makes it better or worse?" (e.g.,  antacids, bending or twisting motion, bowel movement)     BM makes pain better when she is able to go 10. OTHER SYMPTOMS: "Do you have any other symptoms?" (e.g., back pain, diarrhea, fever, urination pain, vomiting)       Pt states she was recently hospitalized and is getting antibiotics through a PICC line. States she is very uncomfortable. Discharged on 2/10. No nausea/vomiting. No fever. Taking Miralax, doesn't seem to be helping.  Protocols used: Abdominal Pain - Female-A-AH

## 2023-10-17 NOTE — Telephone Encounter (Signed)
 Noted.

## 2023-10-18 ENCOUNTER — Telehealth: Payer: Self-pay | Admitting: Family Medicine

## 2023-10-18 ENCOUNTER — Ambulatory Visit (INDEPENDENT_AMBULATORY_CARE_PROVIDER_SITE_OTHER): Payer: 59 | Admitting: Family Medicine

## 2023-10-18 ENCOUNTER — Encounter: Payer: Self-pay | Admitting: Family Medicine

## 2023-10-18 VITALS — BP 102/70 | HR 94 | Temp 98.1°F | Ht 68.0 in

## 2023-10-18 DIAGNOSIS — K5900 Constipation, unspecified: Secondary | ICD-10-CM

## 2023-10-18 DIAGNOSIS — M545 Low back pain, unspecified: Secondary | ICD-10-CM | POA: Diagnosis not present

## 2023-10-18 DIAGNOSIS — M00051 Staphylococcal arthritis, right hip: Secondary | ICD-10-CM

## 2023-10-18 DIAGNOSIS — M86251 Subacute osteomyelitis, right femur: Secondary | ICD-10-CM | POA: Diagnosis not present

## 2023-10-18 MED ORDER — LUBIPROSTONE 24 MCG PO CAPS: 24.0000 ug | ORAL_CAPSULE | Freq: Two times a day (BID) | ORAL | 0 refills | Status: DC

## 2023-10-18 NOTE — Telephone Encounter (Signed)
 Patient stated she has an infection in her hip and she can barely walk, she is using a wheelchair. She stated she just say Dr. Jimmey Ralph. If Dr. Ruthine Dose will not sign, she asked if Dr. Jimmey Ralph will sign it for her. Please advise.

## 2023-10-18 NOTE — Telephone Encounter (Signed)
 Patient dropped off document Handicap Placard, to be filled out by provider. Patient requested to send it back via Call Patient to pick up within 5-days. Document is located in providers tray at front office.Please advise at Mobile [phone] (mobile)

## 2023-10-18 NOTE — Telephone Encounter (Signed)
Placed on providers desk

## 2023-10-18 NOTE — Patient Instructions (Signed)
 It was very nice to see you today!  It is important that we keep your bowels moving.  I am glad that she had a bowel movement this morning.  Please start the Amitiza.  You should try to have at least 1-2 soft bowel movements per day for the next couple of weeks.  Return if symptoms worsen or fail to improve.   Take care, Dr Jimmey Ralph  PLEASE NOTE:  If you had any lab tests, please let us know if you have not heard back within a few days. You may see your results on mychart before we have a chance to review them but we will give you a call once they are reviewed by [place].   If we ordered any referrals today, please let us know if you have not heard from their office within the next week.   If you had any urgent prescriptions sent in today, please check with the pharmacy within an hour of our visit to make sure the prescription was transmitted appropriately.   Please try these tips to maintain a healthy lifestyle:  Eat at least 3 REAL meals and 1-2 snacks per day.  Aim for no more than 5 hours between eating.  If you eat breakfast, please do so within one hour of getting up.   Each meal should contain half fruits/vegetables, one quarter protein, and one quarter carbs (no bigger than a computer mouse)  Cut down on sweet beverages. This includes juice, soda, and sweet tea.   Drink at least 1 glass of water with each meal and aim for at least 8 glasses per day  Exercise at least 150 minutes every week.

## 2023-10-18 NOTE — Progress Notes (Addendum)
   Melanie Li is a 58 y.o. female who presents today for an office visit.  Assessment/Plan:  Constipation Likely multifactorial in setting recent severe illness, anesthesia, and pain medications.  She is also on iron supplementation.  She did have a large bowel movement this morning and symptoms have began to improve.  We discussed importance of having at least 1-2 soft bowel movements daily for the next couple of weeks.  We will start Amitiza.  Encouraged hydration.  She will let us know if she has any return of symptoms.  She did have some blood in the stool this morning likely due to her large bowel movement.  Anticipate that this will improve over the next 1 to 2 weeks however she will let us know if she has persistent hematochezia.  We discussed reasons to return to care.  Septic arthritis/osteomyelitis Following with infectious disease and orthopedics.  She is on 6 weeks course of Ancef per ID.  Her pain is well-controlled.  No signs of systemic illness today.  Will defer further management to ID and orthopedics.      Subjective:  HPI:  Patient here today with abdominal pain and constipation.  Her symptoms started about a week or so ago.  Of note she was hospitalized 3 weeks ago with septic arthritis and osteomyelitis likely due to sequela of intra-articular corticosteroid injection a couple weeks prior to this.  While hospitalized she underwent surgery for washout and spacer placement.  She was discharged home on hospital day 10 with IV antibiotics.  She does have a PICC in place to complete at least 6 weeks of antibiotics.  Since discharge she has been following with infectious disease and orthopedics.  She has no longer taking morphine for pain control.  Overall her pain does seem to be manageable.  She does have intermittent issues with constipation however within the last week was not able to have a bowel movement.  They did try taking MiraLAX without any improvement.  Yesterday her  husband gave her a dose of magnesium citrate.  She did not have any bowel movement yesterday however this morning had a large bowel movement.  States that it was black in color.  She did have some bright red blood in the bowl as well.  She was having some abdominal pain last few days however this is improved significantly since her bowel movement this morning.        Objective:  Physical Exam: BP 102/70   Pulse 94   Temp 98.1 F (36.7 C) (Temporal)   Ht 5\' 8"  (1.727 m)   LMP  (LMP Unknown)   SpO2 100%   BMI 23.46 kg/m   Gen: No acute distress, resting comfortably CV: Regular rate and rhythm with no murmurs appreciated Pulm: Normal work of breathing, clear to auscultation bilaterally with no crackles, wheezes, or rhonchi GI: Soft, nontender, nondistended Neuro: Grossly normal, moves all extremities Psych: Normal affect and thought content  Time Spent: 30 minutes of total time was spent on the date of the encounter performing the following actions: chart review prior to seeing the patient including recent hospitalization, obtaining history, performing a medically necessary exam, counseling on the treatment plan, placing orders, and documenting in our EHR.        Katina Degree. Jimmey Ralph, MD 10/18/2023 10:01 AM

## 2023-10-19 DIAGNOSIS — Z0279 Encounter for issue of other medical certificate: Secondary | ICD-10-CM

## 2023-10-19 NOTE — Telephone Encounter (Signed)
 Form placed up front for pick-up. Patient notified and verbalized understanding.

## 2023-10-20 ENCOUNTER — Telehealth: Payer: Self-pay | Admitting: Family Medicine

## 2023-10-20 ENCOUNTER — Other Ambulatory Visit: Payer: Self-pay | Admitting: Orthopedic Surgery

## 2023-10-20 DIAGNOSIS — E039 Hypothyroidism, unspecified: Secondary | ICD-10-CM

## 2023-10-20 DIAGNOSIS — M86151 Other acute osteomyelitis, right femur: Secondary | ICD-10-CM | POA: Diagnosis not present

## 2023-10-20 DIAGNOSIS — B9561 Methicillin susceptible Staphylococcus aureus infection as the cause of diseases classified elsewhere: Secondary | ICD-10-CM

## 2023-10-20 DIAGNOSIS — I10 Essential (primary) hypertension: Secondary | ICD-10-CM | POA: Diagnosis not present

## 2023-10-20 DIAGNOSIS — I4891 Unspecified atrial fibrillation: Secondary | ICD-10-CM | POA: Diagnosis not present

## 2023-10-20 DIAGNOSIS — K219 Gastro-esophageal reflux disease without esophagitis: Secondary | ICD-10-CM

## 2023-10-20 DIAGNOSIS — F32A Depression, unspecified: Secondary | ICD-10-CM

## 2023-10-20 DIAGNOSIS — Z452 Encounter for adjustment and management of vascular access device: Secondary | ICD-10-CM

## 2023-10-20 DIAGNOSIS — M009 Pyogenic arthritis, unspecified: Secondary | ICD-10-CM | POA: Diagnosis not present

## 2023-10-20 DIAGNOSIS — Z7982 Long term (current) use of aspirin: Secondary | ICD-10-CM

## 2023-10-20 DIAGNOSIS — Z792 Long term (current) use of antibiotics: Secondary | ICD-10-CM

## 2023-10-20 DIAGNOSIS — M5451 Vertebrogenic low back pain: Secondary | ICD-10-CM | POA: Diagnosis not present

## 2023-10-20 DIAGNOSIS — M4640 Discitis, unspecified, site unspecified: Secondary | ICD-10-CM | POA: Diagnosis not present

## 2023-10-20 DIAGNOSIS — R7881 Bacteremia: Secondary | ICD-10-CM

## 2023-10-20 NOTE — Telephone Encounter (Signed)
Form placed on provider's desk for review.

## 2023-10-20 NOTE — Telephone Encounter (Signed)
 Form faxed back to [facility].

## 2023-10-20 NOTE — Telephone Encounter (Signed)
 Received faxed  document Home Health Certificate (Order ID ([id number]), to be filled out by provider. Patient requested to send it back via Fax . Document is located in providers tray at front office.Please advise

## 2023-10-21 DIAGNOSIS — M86151 Other acute osteomyelitis, right femur: Secondary | ICD-10-CM | POA: Diagnosis not present

## 2023-10-23 ENCOUNTER — Encounter: Payer: Self-pay | Admitting: Infectious Disease

## 2023-10-23 DIAGNOSIS — Z792 Long term (current) use of antibiotics: Secondary | ICD-10-CM | POA: Diagnosis not present

## 2023-10-23 DIAGNOSIS — M4640 Discitis, unspecified, site unspecified: Secondary | ICD-10-CM

## 2023-10-23 DIAGNOSIS — M86151 Other acute osteomyelitis, right femur: Secondary | ICD-10-CM | POA: Diagnosis not present

## 2023-10-23 DIAGNOSIS — M4620 Osteomyelitis of vertebra, site unspecified: Secondary | ICD-10-CM

## 2023-10-23 HISTORY — DX: Osteomyelitis of vertebra, site unspecified: M46.20

## 2023-10-23 HISTORY — DX: Discitis, unspecified, site unspecified: M46.40

## 2023-10-23 NOTE — Progress Notes (Unsigned)
 Subjective:  Chief complaint: follow-up disseminated MSSA infection   Patient ID: Melanie Li, female    DOB: Feb 08, 1966, 58 y.o.   MRN: [id number]  HPI  Melanie Li is a 58 y.o. female with with history of hypothyroidism who underwent corticosteroid injection after a injury while playing pickle ball.   Ultimately after some initial benefit from the injection she had worsening pain which made it difficult for her to even ambulate.   She had workup done for radicular pain with MRI of the spine which was unrevealing and then an MRI of the hip that showed of septic arthritis with a large joint fluid collection as well as destruction of cartilage and bone having been seen.   Was sent to the emergency department by Dr. Linna Caprice due to concerns about her septic hip and desire to have this aspirated and have antibiotics started.  Blood cultures taken on admission have subsequent yielded MSSA aspirate of the joint yielded MSSA   2D echocardiogram did not show evidence of endocarditis.   Repeat blood cultures have been taken and no growth so far   She underwent right Girdlestone procedure with the right femoral head having been resected and sent for aerobic and anaerobic tissue culture, The acetabulum also was found to have severe erosive changes.   There are areas where the cartilage of the entire acetabulum was delaminated with an abscess involving the retroacetabular bone medially sp sequentila reaming of the acetabulum, apparently there was an abscess cavity that tracked all the way to the ischium.   Antibiotic cement was used to reduce the hip and there are plans for THA eventually.  In the interim she was seen by Dr. Shon Baton for her lower back pain and repeat MRI sacral area performed and showed   IMPRESSION: 1. 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 This could be better characterized with dedicated lumbar spine MRI with and without contrast. 2. No findings of septic sacroiliac  joint. 3. Abnormal edema signal in the right gluteus medius muscle and along the deep margin of the right iliacus muscle. Possibilities may include denervation edema of the gluteus medius versus mild myositis. No visualized abscess as on today's exam, although the entirety of the gluteus medius was not included. 4. Questionable edema signal in the right obturator internus, proximity to the implant obscures this region. 5. Prominence of stool in the rectal vault, cannot exclude mild fecal impaction. 6. Uterine fibroids. 7. Borderline prominent right external iliac node.  She remains on IV ancef. Dr. Shon Baton has organized IR guided aspirate of the disk space for culture.  Discussed the use of AI scribe software for clinical note transcription with the patient, who gave verbal consent to proceed.  History of Present Illness   The patient also reports a recent episode of severe constipation, which she attributes to her medications. She has a history of back problems and is scheduled to receive a back brace. The patient is also considering a hip replacement surgery in the future, as part of her bone was resected due to the infection.        Past Medical History:  Diagnosis Date   Complication of anesthesia    Tachycardia    Thyroid disease     Past Surgical History:  Procedure Laterality Date   BUNIONECTOMY Left 2017   also had R bunionectomy in 1984 approx   TOTAL HIP ARTHROPLASTY Right 09/27/2023   Procedure: DEBRIDEMENT AND PLACEMENT OF ARTICULATING ANTIBIOTIC SPACER;  Surgeon: Samson Frederic, MD;  Location: [facility];  Service: Orthopedics;  Laterality: Right;   VULVA SURGERY  2019    Family History  Problem Relation Age of Onset   Cancer Mother    Hypertension Mother    Asthma Daughter    Stroke Maternal Grandmother    Cancer Maternal Grandfather    Diabetes Paternal Grandmother    Cancer Paternal Grandfather    Hearing loss Paternal Grandfather       Social History    Socioeconomic History   Marital status: Married    Spouse name: Not on file   Number of children: 1   Years of education: Not on file   Highest education level: Some college, no degree  Occupational History   Not on file  Tobacco Use   Smoking status: Never   Smokeless tobacco: Never  Vaping Use   Vaping status: Never Used  Substance and Sexual Activity   Alcohol use: Yes    Comment: socially   Drug use: Never   Sexual activity: Yes  Other Topics Concern   Not on file  Social History Narrative   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[employer]   Social Drivers of Health   Financial Resource Strain: Low Risk  (10/17/2023)   Overall Financial Resource Strain (CARDIA)    Difficulty of Paying Living Expenses: Not hard at all  Food Insecurity: No Food Insecurity (10/17/2023)   Hunger Vital Sign    Worried About Running Out of Food in the Last Year: Never true    Ran Out of Food in the Last Year: Never true  Transportation Needs: No Transportation Needs (10/17/2023)   PRAPARE - Administrator, Civil Service (Medical): No    Lack of Transportation (Non-Medical): No  Physical Activity: Unknown (10/17/2023)   Exercise Vital Sign    Days of Exercise per Week: Patient declined    Minutes of Exercise per Session: Not on file  Stress: No Stress Concern Present (10/17/2023)   [employer] of Occupational Health - Occupational Stress Questionnaire    Feeling of Stress : Only a little  Social Connections: Moderately Isolated (10/17/2023)   Social Connection and Isolation Panel [NHANES]    Frequency of Communication with Friends and Family: More than three times a week    Frequency of Social Gatherings with Friends and Family: Twice a week    Attends Religious Services: Never    Database administrator or Organizations: No    Attends Engineer, structural: Not on file    Marital Status: Married    Allergies  Allergen Reactions   Sulfa Antibiotics Hives, Rash and Other  (See Comments)    "Rash covered the entire body"   Oxycodone Other (See Comments)    "Very INEFFECTIVE"   Conjugated Estrogens Rash     Current Outpatient Medications:    aspirin 81 MG chewable tablet, Chew 1 tablet (81 mg total) by mouth 2 (two) times daily., Disp: 80 tablet, Rfl: 0   ceFAZolin (ANCEF) IVPB, Inject 2 g into the vein every 8 (eight) hours. Indication:  MSSA bacteremia/septic arthritis  First Dose: Yes Last Day of Therapy:  11/07/2023  Labs - Once weekly:  CBC/D and BMP, Labs - Once weekly: ESR and CRP Method of administration: IV Push Method of administration may be changed at the discretion of home infusion pharmacist based upon assessment of the patient and/or caregiver's ability to self-administer the medication ordered., Disp: 117 Units, Rfl: 0   cyclobenzaprine (FLEXERIL) 10 MG tablet, Take 1 tablet (10 mg total)  by mouth at bedtime., Disp: 30 tablet, Rfl: 3   Ferrous Fumarate (HEMOCYTE - 106 MG FE) 324 (106 Fe) MG TABS tablet, Take 1 tablet (106 mg of iron total) by mouth daily., Disp: 30 tablet, Rfl: 0   gabapentin (NEURONTIN) 300 MG capsule, Take 300 mg by mouth at bedtime., Disp: , Rfl:    levothyroxine (SYNTHROID) 100 MCG tablet, Take 100 mcg by mouth daily before breakfast., Disp: , Rfl:    lubiprostone (AMITIZA) 24 MCG capsule, Take 1 capsule (24 mcg total) by mouth 2 (two) times daily with a meal., Disp: 60 capsule, Rfl: 0   morphine (MSIR) 15 MG tablet, Take 15 mg by mouth every 8 (eight) hours as needed for moderate pain (pain score 4-6) or severe pain (pain score 7-10)., Disp: , Rfl:    pantoprazole (PROTONIX) 40 MG tablet, Take 1 tablet (40 mg total) by mouth daily., Disp: 30 tablet, Rfl: 0   polyethylene glycol (MIRALAX / GLYCOLAX) 17 g packet, Take 17 g by mouth daily as needed for mild constipation., Disp: 14 each, Rfl: 0   TYLENOL 500 MG tablet, Take 1,500 mg by mouth 2 (two) times daily., Disp: , Rfl:    Review of Systems  Constitutional:  Negative for  activity change, appetite change, chills, diaphoresis, fatigue, fever and unexpected weight change.  HENT:  Negative for congestion, rhinorrhea, sinus pressure, sneezing, sore throat and trouble swallowing.   Eyes:  Negative for photophobia and visual disturbance.  Respiratory:  Negative for cough, chest tightness, shortness of breath, wheezing and stridor.   Cardiovascular:  Negative for chest pain, palpitations and leg swelling.  Gastrointestinal:  Negative for abdominal distention, abdominal pain, anal bleeding, blood in stool, constipation, diarrhea, nausea and vomiting.  Genitourinary:  Negative for difficulty urinating, dysuria, flank pain and hematuria.  Musculoskeletal:  Positive for arthralgias and back pain. Negative for gait problem, joint swelling and myalgias.  Skin:  Negative for color change, pallor, rash and wound.  Neurological:  Negative for dizziness, tremors, weakness and light-headedness.  Hematological:  Negative for adenopathy. Does not bruise/bleed easily.  Psychiatric/Behavioral:  Negative for agitation, behavioral problems, confusion, decreased concentration, dysphoric mood and sleep disturbance.        Objective:   Physical Exam Constitutional:      General: She is not in acute distress.    Appearance: Normal appearance. She is well-developed. She is not ill-appearing or diaphoretic.  HENT:     Head: Normocephalic and atraumatic.     Right Ear: Hearing and external ear normal.     Left Ear: Hearing and external ear normal.     Nose: No nasal deformity or rhinorrhea.  Eyes:     General: No scleral icterus.    Conjunctiva/sclera: Conjunctivae normal.     Right eye: Right conjunctiva is not injected.     Left eye: Left conjunctiva is not injected.     Pupils: Pupils are equal, round, and reactive to light.  Neck:     Vascular: No JVD.  Cardiovascular:     Rate and Rhythm: Normal rate and regular rhythm.     Heart sounds: Normal heart sounds, S1 normal and S2  normal. No murmur heard.    No friction rub.  Abdominal:     General: Bowel sounds are normal. There is no distension.     Palpations: Abdomen is soft.     Tenderness: There is no abdominal tenderness.  Musculoskeletal:        General: Normal range of motion.  Right shoulder: Normal.     Left shoulder: Normal.     Cervical back: Normal range of motion and neck supple.     Right hip: Normal.     Left hip: Normal.     Right knee: Normal.     Left knee: Normal.  Lymphadenopathy:     Head:     Right side of head: No submandibular, preauricular or posterior auricular adenopathy.     Left side of head: No submandibular, preauricular or posterior auricular adenopathy.     Cervical: No cervical adenopathy.     Right cervical: No superficial or deep cervical adenopathy.    Left cervical: No superficial or deep cervical adenopathy.  Skin:    General: Skin is warm and dry.     Coloration: Skin is not pale.     Findings: No abrasion, bruising, ecchymosis, erythema, lesion or rash.     Nails: There is no clubbing.  Neurological:     Mental Status: She is alert and oriented to person, place, and time.     Sensory: No sensory deficit.     Coordination: Coordination normal.  Psychiatric:        Attention and Perception: She is attentive.        Mood and Affect: Mood normal.        Speech: Speech normal.        Behavior: Behavior normal. Behavior is cooperative.        Thought Content: Thought content normal.        Judgment: Judgment normal.      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 Bacteremia presents high mortality risk. Treatment aims for complete infection eradication to prevent complications with future hip replacement. - Extend IV  antibiotics for two more weeks, ending on April 1st. - Switch to oral antibiotics (cefadroxil) post-IV antibiotics. - Monitor pain levels for infection resolution. - Delay hip replacement until confident infection is cleared --the presence of diskitis, vertebral osteomyelitis is something that I tend to treat longer, esp if there is hardware which she fortunately does not have  Discitis and vertebral osteomyelitis at L3-L4 Discitis and vertebral osteomyelitis at L3-L4 likely related to Staphylococcus aureus bacteremia. Extended IV antibiotics necessary due to probable prolonged infection presence. - Extend IV antibiotics for two more weeks, ending on April 1st. - Switch to oral antibiotics (cefadroxil) post-IV antibiotics. - Monitor pain levels for infection resolution. - Schedule lumbar spine aspiration for further assessment.  Anemia Low hemoglobin likely due to surgery, infection, and inflammation. Not severe enough for transfusion. - Monitor hemoglobin levels.      Constipation: should not be related to her antibiotics

## 2023-10-24 ENCOUNTER — Other Ambulatory Visit: Payer: Self-pay

## 2023-10-24 ENCOUNTER — Encounter: Payer: Self-pay | Admitting: Infectious Disease

## 2023-10-24 ENCOUNTER — Ambulatory Visit (INDEPENDENT_AMBULATORY_CARE_PROVIDER_SITE_OTHER): Payer: 59 | Admitting: Infectious Disease

## 2023-10-24 ENCOUNTER — Telehealth: Payer: Self-pay

## 2023-10-24 VITALS — BP 118/73 | HR 93 | Temp 99.3°F

## 2023-10-24 DIAGNOSIS — B9561 Methicillin susceptible Staphylococcus aureus infection as the cause of diseases classified elsewhere: Secondary | ICD-10-CM

## 2023-10-24 DIAGNOSIS — K5900 Constipation, unspecified: Secondary | ICD-10-CM

## 2023-10-24 DIAGNOSIS — M5416 Radiculopathy, lumbar region: Secondary | ICD-10-CM | POA: Diagnosis not present

## 2023-10-24 DIAGNOSIS — R7881 Bacteremia: Secondary | ICD-10-CM | POA: Diagnosis not present

## 2023-10-24 DIAGNOSIS — D649 Anemia, unspecified: Secondary | ICD-10-CM

## 2023-10-24 DIAGNOSIS — M4620 Osteomyelitis of vertebra, site unspecified: Secondary | ICD-10-CM

## 2023-10-24 DIAGNOSIS — M4626 Osteomyelitis of vertebra, lumbar region: Secondary | ICD-10-CM | POA: Diagnosis not present

## 2023-10-24 DIAGNOSIS — M86151 Other acute osteomyelitis, right femur: Secondary | ICD-10-CM

## 2023-10-24 DIAGNOSIS — M4646 Discitis, unspecified, lumbar region: Secondary | ICD-10-CM | POA: Diagnosis not present

## 2023-10-24 LAB — LAB REPORT - SCANNED: EGFR: 103

## 2023-10-24 MED ORDER — CEFADROXIL 500 MG PO CAPS: 1000.0000 mg | ORAL_CAPSULE | Freq: Two times a day (BID) | ORAL | 2 refills | Status: DC

## 2023-10-24 NOTE — Discharge Instructions (Signed)
Disc Aspiration Post Procedure Discharge Instructions  May resume a regular diet and any medications that you routinely take (including pain medications). No driving day of procedure. Upon discharge go home and rest for at least 4 hours.  May use an ice pack as needed to injection sites on back. Remove bandades later, today.    Please contact our office at [phone] for the following symptoms:  Fever greater than 100 degrees Increased swelling, pain, or redness at injection site.   Thank you for visiting [facility].

## 2023-10-24 NOTE — Telephone Encounter (Signed)
 Per Dr. Daiva Eves. Extend OPAT to 11/21/23 and okay to pull PICC after last dose. Orders sent to Jeri Modena, RN with [facility].   Sandie Ano, RN

## 2023-10-24 NOTE — Telephone Encounter (Signed)
 Thanks Aundra Millet!

## 2023-10-26 ENCOUNTER — Ambulatory Visit
Admission: RE | Admit: 2023-10-26 | Discharge: 2023-10-26 | Disposition: A | Discharge status: Home / Self Care | Payer: 59 | Source: Ambulatory Visit | Attending: Orthopedic Surgery | Admitting: Orthopedic Surgery

## 2023-10-26 VITALS — BP 121/71 | HR 79 | Temp 98.4°F | Resp 14

## 2023-10-26 DIAGNOSIS — M4646 Discitis, unspecified, lumbar region: Secondary | ICD-10-CM

## 2023-10-26 DIAGNOSIS — M4620 Osteomyelitis of vertebra, site unspecified: Secondary | ICD-10-CM

## 2023-10-26 DIAGNOSIS — R937 Abnormal findings on diagnostic imaging of other parts of musculoskeletal system: Secondary | ICD-10-CM | POA: Diagnosis not present

## 2023-10-26 DIAGNOSIS — M545 Low back pain, unspecified: Secondary | ICD-10-CM | POA: Diagnosis not present

## 2023-10-26 DIAGNOSIS — M5451 Vertebrogenic low back pain: Secondary | ICD-10-CM

## 2023-10-26 MED ORDER — MIDAZOLAM HCL 2 MG/2ML IJ SOLN
1.0000 mg | INTRAMUSCULAR | Status: DC | PRN
Start: 2023-10-26 — End: 2023-10-27
  Administered 2023-10-26 (×2): 1 mg via INTRAVENOUS

## 2023-10-26 MED ORDER — SODIUM CHLORIDE 0.9% FLUSH
10.0000 mL | Freq: Once | INTRAVENOUS | Status: AC
Start: 2023-10-26 — End: 2023-10-26
  Administered 2023-10-26: 10 mL via INTRAVENOUS

## 2023-10-26 MED ORDER — KETOROLAC TROMETHAMINE 30 MG/ML IJ SOLN
30.0000 mg | Freq: Once | INTRAMUSCULAR | Status: AC
  Administered 2023-10-26: 30 mg via INTRAVENOUS

## 2023-10-26 MED ORDER — FENTANYL CITRATE PF 50 MCG/ML IJ SOSY
25.0000 ug | PREFILLED_SYRINGE | INTRAMUSCULAR | Status: DC | PRN
  Administered 2023-10-26 (×3): 25 ug via INTRAVENOUS

## 2023-10-26 MED ORDER — HEPARIN SOD (PORK) LOCK FLUSH 100 UNIT/ML IV SOLN
500.0000 [IU] | Freq: Once | INTRAVENOUS | Status: AC
  Administered 2023-10-26: 500 [IU] via INTRAVENOUS

## 2023-10-26 MED ORDER — SODIUM CHLORIDE 0.9 % IV SOLN
INTRAVENOUS | Status: DC
Start: 2023-10-26 — End: 2023-10-27

## 2023-10-26 NOTE — Progress Notes (Signed)
 Pt back in nursing recovery area. Pt still drowsy from procedure but will wake up when spoken to. Pt follows commands, talks in complete sentences and has no complaints at this time. Pt will remain in nurses station until discharged by Radiologist.

## 2023-10-29 DIAGNOSIS — M86151 Other acute osteomyelitis, right femur: Secondary | ICD-10-CM | POA: Diagnosis not present

## 2023-10-30 DIAGNOSIS — Z792 Long term (current) use of antibiotics: Secondary | ICD-10-CM | POA: Diagnosis not present

## 2023-10-30 DIAGNOSIS — M86151 Other acute osteomyelitis, right femur: Secondary | ICD-10-CM | POA: Diagnosis not present

## 2023-10-31 LAB — ANAEROBIC AND AEROBIC CULTURE
AER RESULT:: NO GROWTH
GRAM STAIN:: NONE SEEN
MICRO NUMBER:: 16167724
MICRO NUMBER:: 16167725
SPECIMEN QUALITY:: ADEQUATE
SPECIMEN QUALITY:: ADEQUATE

## 2023-10-31 LAB — CELL COUNT AND DIFF, FLUID, OTHER
Basophils, %: 0 %
Eosinophils, %: 0 %
Lymphocytes, %: 37 %
Mesothelial, %: 0 %
Monocyte/Macrophage %: 1 %
Neutrophils, %: 62 %
Total Nucleated Cell Ct: 79 {cells}/uL

## 2023-10-31 LAB — LAB REPORT - SCANNED: EGFR: 100

## 2023-11-01 ENCOUNTER — Encounter: Payer: Self-pay | Admitting: Infectious Disease

## 2023-11-02 ENCOUNTER — Encounter: Payer: Self-pay | Admitting: Infectious Disease

## 2023-11-05 DIAGNOSIS — M86151 Other acute osteomyelitis, right femur: Secondary | ICD-10-CM | POA: Diagnosis not present

## 2023-11-06 DIAGNOSIS — Z792 Long term (current) use of antibiotics: Secondary | ICD-10-CM | POA: Diagnosis not present

## 2023-11-06 DIAGNOSIS — M86151 Other acute osteomyelitis, right femur: Secondary | ICD-10-CM | POA: Diagnosis not present

## 2023-11-07 DIAGNOSIS — M86151 Other acute osteomyelitis, right femur: Secondary | ICD-10-CM | POA: Diagnosis not present

## 2023-11-08 ENCOUNTER — Encounter: Payer: Self-pay | Admitting: Infectious Disease

## 2023-11-08 DIAGNOSIS — M86151 Other acute osteomyelitis, right femur: Secondary | ICD-10-CM | POA: Diagnosis not present

## 2023-11-09 ENCOUNTER — Other Ambulatory Visit: Payer: Self-pay | Admitting: Family Medicine

## 2023-11-09 DIAGNOSIS — M86151 Other acute osteomyelitis, right femur: Secondary | ICD-10-CM | POA: Diagnosis not present

## 2023-11-10 DIAGNOSIS — M86151 Other acute osteomyelitis, right femur: Secondary | ICD-10-CM | POA: Diagnosis not present

## 2023-11-11 DIAGNOSIS — M86151 Other acute osteomyelitis, right femur: Secondary | ICD-10-CM | POA: Diagnosis not present

## 2023-11-12 DIAGNOSIS — M86151 Other acute osteomyelitis, right femur: Secondary | ICD-10-CM | POA: Diagnosis not present

## 2023-11-13 DIAGNOSIS — Z792 Long term (current) use of antibiotics: Secondary | ICD-10-CM | POA: Diagnosis not present

## 2023-11-13 DIAGNOSIS — M86151 Other acute osteomyelitis, right femur: Secondary | ICD-10-CM | POA: Diagnosis not present

## 2023-11-14 DIAGNOSIS — M86151 Other acute osteomyelitis, right femur: Secondary | ICD-10-CM | POA: Diagnosis not present

## 2023-11-14 LAB — LAB REPORT - SCANNED
Calcium: 9.5
EGFR: 104

## 2023-11-15 DIAGNOSIS — M86151 Other acute osteomyelitis, right femur: Secondary | ICD-10-CM | POA: Diagnosis not present

## 2023-11-16 DIAGNOSIS — M86151 Other acute osteomyelitis, right femur: Secondary | ICD-10-CM | POA: Diagnosis not present

## 2023-11-17 DIAGNOSIS — M5451 Vertebrogenic low back pain: Secondary | ICD-10-CM | POA: Diagnosis not present

## 2023-11-17 DIAGNOSIS — M4640 Discitis, unspecified, site unspecified: Secondary | ICD-10-CM | POA: Diagnosis not present

## 2023-11-17 DIAGNOSIS — M86151 Other acute osteomyelitis, right femur: Secondary | ICD-10-CM | POA: Diagnosis not present

## 2023-11-18 DIAGNOSIS — M86151 Other acute osteomyelitis, right femur: Secondary | ICD-10-CM | POA: Diagnosis not present

## 2023-11-19 DIAGNOSIS — M86151 Other acute osteomyelitis, right femur: Secondary | ICD-10-CM | POA: Diagnosis not present

## 2023-11-20 ENCOUNTER — Encounter: Payer: Self-pay | Admitting: Infectious Disease

## 2023-11-20 DIAGNOSIS — Z792 Long term (current) use of antibiotics: Secondary | ICD-10-CM | POA: Diagnosis not present

## 2023-11-20 DIAGNOSIS — M86151 Other acute osteomyelitis, right femur: Secondary | ICD-10-CM | POA: Diagnosis not present

## 2023-11-21 ENCOUNTER — Encounter: Payer: Self-pay | Admitting: Infectious Disease

## 2023-11-21 DIAGNOSIS — M86151 Other acute osteomyelitis, right femur: Secondary | ICD-10-CM | POA: Diagnosis not present

## 2023-11-22 DIAGNOSIS — M86151 Other acute osteomyelitis, right femur: Secondary | ICD-10-CM | POA: Diagnosis not present

## 2023-11-22 LAB — LAB REPORT - SCANNED: EGFR: 102

## 2023-11-24 ENCOUNTER — Encounter: Payer: Self-pay | Admitting: Infectious Disease

## 2023-11-24 DIAGNOSIS — M00051 Staphylococcal arthritis, right hip: Secondary | ICD-10-CM | POA: Diagnosis not present

## 2023-12-09 NOTE — Progress Notes (Signed)
 Subjective:  Chief complaint:followup for disseminated MSSA infection    Patient ID: Melanie Li, female    DOB: 08/20/1966, 58 y.o.   MRN: [id number]  HPI  Melanie Li is a 58 y.o. female with with history of hypothyroidism who underwent corticosteroid injection after a injury while playing pickle ball.   Ultimately after some initial benefit from the injection she had worsening pain which made it difficult for her to even ambulate.   She had workup done for radicular pain with MRI of the spine which was unrevealing and then an MRI of the hip that showed of septic arthritis with a large joint fluid collection as well as destruction of cartilage and bone having been seen.   Was sent to the emergency department by Dr. Charol Copas due to concerns about her septic hip and desire to have this aspirated and have antibiotics started.  Blood cultures taken on admission have subsequent yielded MSSA aspirate of the joint yielded MSSA   2D echocardiogram did not show evidence of endocarditis.   Repeat blood cultures have been taken and no growth so far   She underwent right Girdlestone procedure with the right femoral head having been resected and sent for aerobic and anaerobic tissue culture, The acetabulum also was found to have severe erosive changes.   There are areas where the cartilage of the entire acetabulum was delaminated with an abscess involving the retroacetabular bone medially sp sequentila reaming of the acetabulum, apparently there was an abscess cavity that tracked all the way to the ischium.   Antibiotic cement was used to reduce the hip and there are plans for THA eventually.  In the interim she was seen by Dr. Vaughn Georges for her lower back pain and repeat MRI sacral area performed and showed   IMPRESSION: 1. 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 This could be better characterized with dedicated lumbar spine MRI with and without contrast. 2. No findings of septic sacroiliac  joint. 3. Abnormal edema signal in the right gluteus medius muscle and along the deep margin of the right iliacus muscle. Possibilities may include denervation edema of the gluteus medius versus mild myositis. No visualized abscess as on today's exam, although the entirety of the gluteus medius was not included. 4. Questionable edema signal in the right obturator internus, proximity to the implant obscures this region. 5. Prominence of stool in the rectal vault, cannot exclude mild fecal impaction. 6. Uterine fibroids. 7. Borderline prominent right external iliac node.  Dr. Vaughn Georges organized IR guided aspirate of the disk space for culture.but this predictably yielded no organism.    She completed  IV ancef  and took a few days of oral antibiotics.  Succussion with Dr. Carlye Child and the patient's clinical picture seem to be consistent with someone who would resolution of her discitis and we felt comfortable having her stop antibiotics.  Discussed the use of AI scribe software for clinical note transcription with the patient, who gave verbal consent to proceed.  History of Present Illness   The patient, with a history of a severe hip infection, and diskitis presents for a follow-up after completing a course of antibiotics. She reports that her back pain has resolved, and her hip pain is dependent on her activity level and posture. She experiences pain when she leans over too far or exerts herself during the day. She also reports pain radiating through her buttock, which another provider suspects is due to bursitis. The patient has been off antibiotics for two weeks, with a brief transition  to oral antibiotics before discontinuation.               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/2023   Tachycardia    Thyroid  disease    Vertebral osteomyelitis (HCC) 10/23/2023    Past Surgical History:  Procedure Laterality Date   BUNIONECTOMY  Left 2017   also had R bunionectomy in 1984 approx   TOTAL HIP ARTHROPLASTY Right 09/27/2023   Procedure: DEBRIDEMENT AND PLACEMENT OF ARTICULATING ANTIBIOTIC SPACER;  Surgeon: Adonica Hoose, MD;  Location: [facility];  Service: Orthopedics;  Laterality: Right;   VULVA SURGERY  2019    Family History  Problem Relation Age of Onset   Cancer Mother    Hypertension Mother    Asthma Daughter    Stroke Maternal Grandmother    Cancer Maternal Grandfather    Diabetes Paternal Grandmother    Cancer Paternal Grandfather    Hearing loss Paternal Grandfather       Social History   Socioeconomic History   Marital status: Married    Spouse name: Not on file   Number of children: 1   Years of education: Not on file   Highest education level: Some college, no degree  Occupational History   Not on file  Tobacco Use   Smoking status: Never   Smokeless tobacco: Never  Vaping Use   Vaping status: Never Used  Substance and Sexual Activity   Alcohol  use: Yes    Comment: socially   Drug use: Never   Sexual activity: Yes  Other Topics Concern   Not on file  Social History Narrative   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[employer]   Social Drivers of Health   Financial Resource Strain: Low Risk  (10/17/2023)   Overall Financial Resource Strain (CARDIA)    Difficulty of Paying Living Expenses: Not hard at all  Food Insecurity: No Food Insecurity (10/17/2023)   Hunger Vital Sign    Worried About Running Out of Food in the Last Year: Never true    Ran Out of Food in the Last Year: Never true  Transportation Needs: No Transportation Needs (10/17/2023)   PRAPARE - Administrator, Civil Service (Medical): No    Lack of Transportation (Non-Medical): No  Physical Activity: Unknown (10/17/2023)   Exercise Vital Sign    Days of Exercise per Week: Patient declined    Minutes of Exercise per Session: Not on file  Stress: No Stress Concern Present (10/17/2023)   [employer] of Occupational  Health - Occupational Stress Questionnaire    Feeling of Stress : Only a little  Social Connections: Moderately Isolated (10/17/2023)   Social Connection and Isolation Panel [NHANES]    Frequency of Communication with Friends and Family: More than three times a week    Frequency of Social Gatherings with Friends and Family: Twice a week    Attends Religious Services: Never    Database administrator or Organizations: No    Attends Engineer, structural: Not on file    Marital Status: Married    Allergies  Allergen Reactions   Sulfa Antibiotics Hives, Rash and Other (See Comments)    "Rash covered the entire body"   Oxycodone  Other (See Comments)    "Very INEFFECTIVE"   Conjugated Estrogens Rash     Current Outpatient Medications:    Bacillus Coagulans-Inulin (PROBIOTIC) 1-250 BILLION-MG CAPS, as directed Orally, Disp: , Rfl:    cefadroxil  (DURICEF)  500 MG capsule, Take 2 capsules (1,000 mg total) by mouth 2 (two) times daily., Disp: 120 capsule, Rfl: 2   cyclobenzaprine  (FLEXERIL ) 10 MG tablet, Take 1 tablet (10 mg total) by mouth at bedtime., Disp: 30 tablet, Rfl: 3   Ferrous Fumarate  (HEMOCYTE - 106 MG FE) 324 (106 Fe) MG TABS tablet, Take 1 tablet (106 mg of iron total) by mouth daily., Disp: 30 tablet, Rfl: 0   gabapentin  (NEURONTIN ) 300 MG capsule, Take 300 mg by mouth at bedtime., Disp: , Rfl:    levothyroxine  (SYNTHROID ) 100 MCG tablet, Take 100 mcg by mouth daily before breakfast., Disp: , Rfl:    lubiprostone  (AMITIZA ) 24 MCG capsule, TAKE 1 CAPSULE (24 MCG TOTAL) BY MOUTH 2 (TWO) TIMES DAILY WITH A MEAL., Disp: 180 capsule, Rfl: 1   morphine  (MSIR) 15 MG tablet, Take 15 mg by mouth every 8 (eight) hours as needed for moderate pain (pain score 4-6) or severe pain (pain score 7-10). (Patient not taking: Reported on 10/24/2023), Disp: , Rfl:    pantoprazole  (PROTONIX ) 40 MG tablet, Take 1 tablet (40 mg total) by mouth daily., Disp: 30 tablet, Rfl: 0   polyethylene glycol  (MIRALAX  / GLYCOLAX ) 17 g packet, Take 17 g by mouth daily as needed for mild constipation., Disp: 14 each, Rfl: 0   TYLENOL  500 MG tablet, Take 1,500 mg by mouth 2 (two) times daily., Disp: , Rfl:    Review of Systems  Constitutional:  Negative for activity change, appetite change, chills, diaphoresis, fatigue, fever and unexpected weight change.  HENT:  Negative for congestion, rhinorrhea, sinus pressure, sneezing, sore throat and trouble swallowing.   Eyes:  Negative for photophobia and visual disturbance.  Respiratory:  Negative for cough, chest tightness, shortness of breath, wheezing and stridor.   Cardiovascular:  Negative for chest pain, palpitations and leg swelling.  Gastrointestinal:  Negative for abdominal distention, abdominal pain, anal bleeding, blood in stool, constipation, diarrhea, nausea and vomiting.  Genitourinary:  Negative for difficulty urinating, dysuria, flank pain and hematuria.  Musculoskeletal:  Positive for arthralgias. Negative for back pain, gait problem, joint swelling and myalgias.  Skin:  Negative for color change, pallor, rash and wound.  Neurological:  Negative for dizziness, tremors, weakness and light-headedness.  Hematological:  Negative for adenopathy. Does not bruise/bleed easily.  Psychiatric/Behavioral:  Negative for agitation, behavioral problems, confusion, decreased concentration, dysphoric mood and sleep disturbance.        Objective:   Physical Exam Constitutional:      General: She is not in acute distress.    Appearance: Normal appearance. She is well-developed. She is not ill-appearing or diaphoretic.  HENT:     Head: Normocephalic and atraumatic.     Right Ear: Hearing and external ear normal.     Left Ear: Hearing and external ear normal.     Nose: No nasal deformity or rhinorrhea.  Eyes:     General: No scleral icterus.    Conjunctiva/sclera: Conjunctivae normal.     Right eye: Right conjunctiva is not injected.     Left eye:  Left conjunctiva is not injected.     Pupils: Pupils are equal, round, and reactive to light.  Neck:     Vascular: No JVD.  Cardiovascular:     Rate and Rhythm: Normal rate and regular rhythm.     Heart sounds: Normal heart sounds, S1 normal and S2 normal. No murmur heard.    No friction rub.  Abdominal:     General: Bowel sounds  are normal. There is no distension.     Palpations: Abdomen is soft.     Tenderness: There is no abdominal tenderness.  Musculoskeletal:        General: Normal range of motion.     Right shoulder: Normal.     Left shoulder: Normal.     Cervical back: Normal range of motion and neck supple.     Right hip: Normal.     Left hip: Normal.     Right knee: Normal.     Left knee: Normal.  Lymphadenopathy:     Head:     Right side of head: No submandibular, preauricular or posterior auricular adenopathy.     Left side of head: No submandibular, preauricular or posterior auricular adenopathy.     Cervical: No cervical adenopathy.     Right cervical: No superficial or deep cervical adenopathy.    Left cervical: No superficial or deep cervical adenopathy.  Skin:    General: Skin is warm and dry.     Coloration: Skin is not pale.     Findings: No abrasion, bruising, ecchymosis, erythema, lesion or rash.     Nails: There is no clubbing.  Neurological:     Mental Status: She is alert and oriented to person, place, and time.  Psychiatric:        Attention and Perception: She is attentive.        Mood and Affect: Mood normal.        Speech: Speech normal.        Behavior: Behavior normal. Behavior is cooperative.        Thought Content: Thought content normal.        Judgment: Judgment normal.         Assessment & Plan:   Assessment and Plan    Hip joint infection Severe infection in her native hip joint required extensive antibiotic treatment. Off antibiotics for two weeks post-PICC line removal. Inflammatory markers normalized. Intermittent hip pain with  activity, not constant or worrisome. Hip joint aspiration planned to confirm infection resolution. - Recheck inflammatory markers for signs of persistent infection. -orthopedic team will perform hip joint aspiration to check for residual infection, cell count and differential, cultures - Follow up with Dr. Charol Copas s PA and the Dr. Vaughn Georges       Diskitis: seems clincially resolved and was likely there just not visitle on imaging when she was in the hospital originally

## 2023-12-11 ENCOUNTER — Ambulatory Visit (INDEPENDENT_AMBULATORY_CARE_PROVIDER_SITE_OTHER): Payer: Self-pay | Admitting: Infectious Disease

## 2023-12-11 ENCOUNTER — Other Ambulatory Visit: Payer: Self-pay

## 2023-12-11 ENCOUNTER — Encounter: Payer: Self-pay | Admitting: Infectious Disease

## 2023-12-11 VITALS — BP 111/72 | HR 76 | Resp 16 | Ht 68.0 in | Wt 154.3 lb

## 2023-12-11 DIAGNOSIS — M4646 Discitis, unspecified, lumbar region: Secondary | ICD-10-CM

## 2023-12-11 DIAGNOSIS — M86251 Subacute osteomyelitis, right femur: Secondary | ICD-10-CM | POA: Diagnosis not present

## 2023-12-11 DIAGNOSIS — M86151 Other acute osteomyelitis, right femur: Secondary | ICD-10-CM | POA: Diagnosis not present

## 2023-12-11 DIAGNOSIS — B9561 Methicillin susceptible Staphylococcus aureus infection as the cause of diseases classified elsewhere: Secondary | ICD-10-CM

## 2023-12-11 DIAGNOSIS — R7881 Bacteremia: Secondary | ICD-10-CM | POA: Diagnosis not present

## 2023-12-12 LAB — CBC WITH DIFFERENTIAL/PLATELET
Absolute Lymphocytes: 1263 {cells}/uL (ref 850–3900)
Absolute Monocytes: 317 {cells}/uL (ref 200–950)
Basophils Absolute: 40 {cells}/uL (ref 0–200)
Basophils Relative: 0.9 %
Eosinophils Absolute: 48 {cells}/uL (ref 15–500)
Eosinophils Relative: 1.1 %
HCT: 36.1 % (ref 35.0–45.0)
Hemoglobin: 12.1 g/dL (ref 11.7–15.5)
MCH: 30.2 pg (ref 27.0–33.0)
MCHC: 33.5 g/dL (ref 32.0–36.0)
MCV: 90 fL (ref 80.0–100.0)
MPV: 9.2 fL (ref 7.5–12.5)
Monocytes Relative: 7.2 %
Neutro Abs: 2732 {cells}/uL (ref 1500–7800)
Neutrophils Relative %: 62.1 %
Platelets: 269 10*3/uL (ref 140–400)
RBC: 4.01 10*6/uL (ref 3.80–5.10)
RDW: 12 % (ref 11.0–15.0)
Total Lymphocyte: 28.7 %
WBC: 4.4 10*3/uL (ref 3.8–10.8)

## 2023-12-12 LAB — BASIC METABOLIC PANEL WITHOUT GFR
BUN: 22 mg/dL (ref 7–25)
CO2: 27 mmol/L (ref 20–32)
Calcium: 9.7 mg/dL (ref 8.6–10.4)
Chloride: 107 mmol/L (ref 98–110)
Creat: 0.67 mg/dL (ref 0.50–1.05)
Glucose, Bld: 76 mg/dL (ref 65–99)
Potassium: 4.4 mmol/L (ref 3.5–5.3)
Sodium: 142 mmol/L (ref 135–146)

## 2023-12-12 LAB — C-REACTIVE PROTEIN: CRP: <3 mg/L (ref <8.0)

## 2023-12-12 LAB — SEDIMENTATION RATE: Sed Rate: 9 mm/h (ref 0–30)

## 2023-12-13 DIAGNOSIS — M4646 Discitis, unspecified, lumbar region: Secondary | ICD-10-CM | POA: Diagnosis not present

## 2023-12-13 DIAGNOSIS — M545 Low back pain, unspecified: Secondary | ICD-10-CM | POA: Diagnosis not present

## 2023-12-13 DIAGNOSIS — M25551 Pain in right hip: Secondary | ICD-10-CM | POA: Diagnosis not present

## 2023-12-14 ENCOUNTER — Encounter: Payer: Self-pay | Admitting: Infectious Disease

## 2023-12-19 ENCOUNTER — Encounter: Payer: Self-pay | Admitting: Infectious Disease

## 2024-01-08 NOTE — Progress Notes (Signed)
 Surgery orders requested via Epic inbox.

## 2024-01-10 NOTE — Patient Instructions (Signed)
 SURGICAL WAITING ROOM VISITATION  Patients having surgery or a procedure may have no more than 2 support people in the waiting area - these visitors may rotate.    Children under the age of 55 must have an adult with them who is not the patient.  Due to an increase in RSV and influenza rates and associated hospitalizations, children ages 9 and under may not visit patients in [facility] hospitals.  Visitors with respiratory illnesses are discouraged from visiting and should remain at home.  If the patient needs to stay at the hospital during part of their recovery, the visitor guidelines for inpatient rooms apply. Pre-op nurse will coordinate an appropriate time for 1 support person to accompany patient in pre-op.  This support person may not rotate.    Please refer to the [facility] website for the visitor guidelines for Inpatients (after your surgery is over and you are in a regular room).       Your procedure is scheduled on: 01/18/24   Report to [facility] Main Entrance    Report to admitting at : 5:15 AM   Call this number if you have problems the morning of surgery [phone]   Do not eat food :After Midnight.   After Midnight you may have the following liquids until: 4:30 AM DAY OF SURGERY  Water Non-Citrus Juices (without pulp, NO RED-Apple, White grape, White cranberry) Black Coffee (NO MILK/CREAM OR CREAMERS, sugar ok)  Clear Tea (NO MILK/CREAM OR CREAMERS, sugar ok) regular and decaf                             Plain Jell-O (NO RED)                                           Fruit ices (not with fruit pulp, NO RED)                                     Popsicles (NO RED)                                                               Sports drinks like Gatorade (NO RED)   The day of surgery:  Drink ONE (1) Pre-Surgery Clear Ensure at : 4:30 AM the morning of surgery. Drink in one sitting. Do not sip.  This drink was given to you during your hospital  pre-op  appointment visit. Nothing else to drink after completing the  Pre-Surgery Clear Ensure or G2.          If you have questions, please contact your surgeon's office.  FOLLOW ANY ADDITIONAL PRE OP INSTRUCTIONS YOU RECEIVED FROM YOUR SURGEON'S OFFICE!!!   Oral Hygiene is also important to reduce your risk of infection.                                    Remember - BRUSH YOUR TEETH THE MORNING OF SURGERY WITH YOUR REGULAR TOOTHPASTE  DENTURES  WILL BE REMOVED PRIOR TO SURGERY PLEASE DO NOT APPLY "Poly grip" OR ADHESIVES!!!   Do NOT smoke after Midnight   Stop all vitamins and herbal supplements 7 days before surgery.   Take these medicines the morning of surgery with A SIP OF WATER: amitiza (lubiprostone )                              You may not have any metal on your body including hair pins, jewelry, and body piercing             Do not wear make-up, lotions, powders, perfumes/cologne, or deodorant  Do not wear nail polish including gel and S&S, artificial/acrylic nails, or any other type of covering on natural nails including finger and toenails. If you have artificial nails, gel coating, etc. that needs to be removed by a nail salon please have this removed prior to surgery or surgery may need to be canceled/ delayed if the surgeon/ anesthesia feels like they are unable to be safely monitored.   Do not shave  48 hours prior to surgery.    Do not bring valuables to the hospital. [facility] IS NOT             RESPONSIBLE   FOR VALUABLES.   Contacts, glasses, dentures or bridgework may not be worn into surgery.   Bring small overnight bag day of surgery.   DO NOT BRING YOUR HOME MEDICATIONS TO THE HOSPITAL. PHARMACY WILL DISPENSE MEDICATIONS LISTED ON YOUR MEDICATION LIST TO YOU DURING YOUR ADMISSION IN THE HOSPITAL!    Patients discharged on the day of surgery will not be allowed to drive home.  Someone NEEDS to stay with you for the first 24 hours after anesthesia.   Special  Instructions: Bring a copy of your healthcare power of attorney and living will documents the day of surgery if you haven't scanned them before.              Please read over the following fact sheets you were given: IF YOU HAVE QUESTIONS ABOUT YOUR PRE-OP INSTRUCTIONS PLEASE CALL [phone]   If you received a COVID test during your pre-op visit  it is requested that you wear a mask when out in public, stay away from anyone that may not be feeling well and notify your surgeon if you develop symptoms. If you test positive for Covid or have been in contact with anyone that has tested positive in the last 10 days please notify you surgeon.      Pre-operative 5 CHG Bath Instructions   You can play a key role in reducing the risk of infection after surgery. Your skin needs to be as free of germs as possible. You can reduce the number of germs on your skin by washing with CHG (chlorhexidine  gluconate) soap before surgery. CHG is an antiseptic soap that kills germs and continues to kill germs even after washing.   DO NOT use if you have an allergy to chlorhexidine /CHG or antibacterial soaps. If your skin becomes reddened or irritated, stop using the CHG and notify one of our RNs at [phone].   Please shower with the CHG soap starting 4 days before surgery using the following schedule:     Please keep in mind the following:  DO NOT shave, including legs and underarms, starting the day of your first shower.   You may shave your face at any point before/day of surgery.  Place clean sheets on your bed the day you start using CHG soap. Use a clean washcloth (not used since being washed) for each shower. DO NOT sleep with pets once you start using the CHG.   CHG Shower Instructions:  If you choose to wash your hair and private area, wash first with your normal shampoo/soap.  After you use shampoo/soap, rinse your hair and body thoroughly to remove shampoo/soap residue.  Turn the water OFF and  apply about 3 tablespoons (45 ml) of CHG soap to a CLEAN washcloth.  Apply CHG soap ONLY FROM YOUR NECK DOWN TO YOUR TOES (washing for 3-5 minutes)  DO NOT use CHG soap on face, private areas, open wounds, or sores.  Pay special attention to the area where your surgery is being performed.  If you are having back surgery, having someone wash your back for you may be helpful. Wait 2 minutes after CHG soap is applied, then you may rinse off the CHG soap.  Pat dry with a clean towel  Put on clean clothes/pajamas   If you choose to wear lotion, please use ONLY the CHG-compatible lotions on the back of this paper.     Additional instructions for the day of surgery: DO NOT APPLY any lotions, deodorants, cologne, or perfumes.   Put on clean/comfortable clothes.  Brush your teeth.  Ask your nurse before applying any prescription medications to the skin.   CHG Compatible Lotions   Aveeno Moisturizing lotion  Cetaphil Moisturizing Cream  Cetaphil Moisturizing Lotion  Clairol Herbal Essence Moisturizing Lotion, Dry Skin  Clairol Herbal Essence Moisturizing Lotion, Extra Dry Skin  Clairol Herbal Essence Moisturizing Lotion, Normal Skin  Curel Age Defying Therapeutic Moisturizing Lotion with Alpha Hydroxy  Curel Extreme Care Body Lotion  Curel Soothing Hands Moisturizing Hand Lotion  Curel Therapeutic Moisturizing Cream, Fragrance-Free  Curel Therapeutic Moisturizing Lotion, Fragrance-Free  Curel Therapeutic Moisturizing Lotion, Original Formula  Eucerin Daily Replenishing Lotion  Eucerin Dry Skin Therapy Plus Alpha Hydroxy Crme  Eucerin Dry Skin Therapy Plus Alpha Hydroxy Lotion  Eucerin Original Crme  Eucerin Original Lotion  Eucerin Plus Crme Eucerin Plus Lotion  Eucerin TriLipid Replenishing Lotion  Keri Anti-Bacterial Hand Lotion  Keri Deep Conditioning Original Lotion Dry Skin Formula Softly Scented  Keri Deep Conditioning Original Lotion, Fragrance Free Sensitive Skin Formula   Keri Lotion Fast Absorbing Fragrance Free Sensitive Skin Formula  Keri Lotion Fast Absorbing Softly Scented Dry Skin Formula  Keri Original Lotion  Keri Skin Renewal Lotion Keri Silky Smooth Lotion  Keri Silky Smooth Sensitive Skin Lotion  Nivea Body Creamy Conditioning Oil  Nivea Body Extra Enriched Lotion  Nivea Body Original Lotion  Nivea Body Sheer Moisturizing Lotion Nivea Crme  Nivea Skin Firming Lotion  NutraDerm 30 Skin Lotion  NutraDerm Skin Lotion  NutraDerm Therapeutic Skin Cream  NutraDerm Therapeutic Skin Lotion  ProShield Protective Hand Cream  Provon moisturizing lotion   Incentive Spirometer  An incentive spirometer is a tool that can help keep your lungs clear and active. This tool measures how well you are filling your lungs with each breath. Taking long deep breaths may help reverse or decrease the chance of developing breathing (pulmonary) problems (especially infection) following: A long period of time when you are unable to move or be active. BEFORE THE PROCEDURE  If the spirometer includes an indicator to show your best effort, your nurse or respiratory therapist will set it to a desired goal. If possible, sit up straight or lean slightly forward. Try  not to slouch. Hold the incentive spirometer in an upright position. INSTRUCTIONS FOR USE  Sit on the edge of your bed if possible, or sit up as far as you can in bed or on a chair. Hold the incentive spirometer in an upright position. Breathe out normally. Place the mouthpiece in your mouth and seal your lips tightly around it. Breathe in slowly and as deeply as possible, raising the piston or the ball toward the top of the column. Hold your breath for 3-5 seconds or for as long as possible. Allow the piston or ball to fall to the bottom of the column. Remove the mouthpiece from your mouth and breathe out normally. Rest for a few seconds and repeat Steps 1 through 7 at least 10 times every 1-2 hours when you are  awake. Take your time and take a few normal breaths between deep breaths. The spirometer may include an indicator to show your best effort. Use the indicator as a goal to work toward during each repetition. After each set of 10 deep breaths, practice coughing to be sure your lungs are clear. If you have an incision (the cut made at the time of surgery), support your incision when coughing by placing a pillow or rolled up towels firmly against it. Once you are able to get out of bed, walk around indoors and cough well. You may stop using the incentive spirometer when instructed by your caregiver.  RISKS AND COMPLICATIONS Take your time so you do not get dizzy or light-headed. If you are in pain, you may need to take or ask for pain medication before doing incentive spirometry. It is harder to take a deep breath if you are having pain. AFTER USE Rest and breathe slowly and easily. It can be helpful to keep track of a log of your progress. Your caregiver can provide you with a simple table to help with this. If you are using the spirometer at home, follow these instructions: SEEK MEDICAL CARE IF:  You are having difficultly using the spirometer. You have trouble using the spirometer as often as instructed. Your pain medication is not giving enough relief while using the spirometer. You develop fever of 100.5 F (38.1 C) or higher. SEEK IMMEDIATE MEDICAL CARE IF:  You cough up bloody sputum that had not been present before. You develop fever of 102 F (38.9 C) or greater. You develop worsening pain at or near the incision site. MAKE SURE YOU:  Understand these instructions. Will watch your condition. Will get help right away if you are not doing well or get worse. Document Released: 12/19/2006 Document Revised: 10/31/2011 Document Reviewed: 02/19/2007 [facility] Patient Information 2014 [place], [place].   ________________________________________________________________________

## 2024-01-11 ENCOUNTER — Ambulatory Visit: Payer: Self-pay | Admitting: Student

## 2024-01-11 ENCOUNTER — Telehealth: Payer: Self-pay | Admitting: Family Medicine

## 2024-01-11 ENCOUNTER — Other Ambulatory Visit: Payer: Self-pay

## 2024-01-11 ENCOUNTER — Encounter (HOSPITAL_COMMUNITY)
Admission: RE | Admit: 2024-01-11 | Discharge: 2024-01-11 | Disposition: A | Discharge status: Home / Self Care | Source: Ambulatory Visit | Attending: Orthopedic Surgery | Admitting: Orthopedic Surgery

## 2024-01-11 ENCOUNTER — Encounter (HOSPITAL_COMMUNITY): Payer: Self-pay

## 2024-01-11 VITALS — BP 114/81 | HR 73 | Temp 98.0°F | Ht 68.0 in | Wt 142.0 lb

## 2024-01-11 DIAGNOSIS — Z01812 Encounter for preprocedural laboratory examination: Secondary | ICD-10-CM | POA: Diagnosis present

## 2024-01-11 DIAGNOSIS — Z96641 Presence of right artificial hip joint: Secondary | ICD-10-CM | POA: Insufficient documentation

## 2024-01-11 DIAGNOSIS — M4646 Discitis, unspecified, lumbar region: Secondary | ICD-10-CM | POA: Insufficient documentation

## 2024-01-11 DIAGNOSIS — R000 Tachycardia, unspecified: Secondary | ICD-10-CM

## 2024-01-11 DIAGNOSIS — E039 Hypothyroidism, unspecified: Secondary | ICD-10-CM | POA: Diagnosis not present

## 2024-01-11 DIAGNOSIS — Z01818 Encounter for other preprocedural examination: Secondary | ICD-10-CM

## 2024-01-11 HISTORY — DX: Supraventricular tachycardia, unspecified: I47.10

## 2024-01-11 HISTORY — DX: Pneumonia, unspecified organism: J18.9

## 2024-01-11 HISTORY — DX: Hypothyroidism, unspecified: E03.9

## 2024-01-11 LAB — BASIC METABOLIC PANEL WITH GFR
Anion gap: 6 (ref 5–15)
BUN: 20 mg/dL (ref 6–20)
CO2: 29 mmol/L (ref 22–32)
Calcium: 9.8 mg/dL (ref 8.9–10.3)
Chloride: 107 mmol/L (ref 98–111)
Creatinine, Ser: 0.74 mg/dL (ref 0.44–1.00)
GFR, Estimated: >60 mL/min (ref >60)
Glucose, Bld: 86 mg/dL (ref 70–99)
Potassium: 4.3 mmol/L (ref 3.5–5.1)
Sodium: 142 mmol/L (ref 135–145)

## 2024-01-11 LAB — CBC
HCT: 39.5 % (ref 36.0–46.0)
Hemoglobin: 13.1 g/dL (ref 12.0–15.0)
MCH: 30 pg (ref 26.0–34.0)
MCHC: 33.2 g/dL (ref 30.0–36.0)
MCV: 90.6 fL (ref 80.0–100.0)
Platelets: 272 10*3/uL (ref 150–400)
RBC: 4.36 MIL/uL (ref 3.87–5.11)
RDW: 12.4 % (ref 11.5–15.5)
WBC: 5.5 10*3/uL (ref 4.0–10.5)
nRBC: 0 % (ref 0.0–0.2)

## 2024-01-11 LAB — SURGICAL PCR SCREEN
MRSA, PCR: NEGATIVE
Staphylococcus aureus: NEGATIVE

## 2024-01-11 NOTE — H&P (View-Only) (Signed)
 PREOPERATIVE H&P  Chief Complaint: right hip pain  HPI: Melanie Li is a 58 y.o. female who presents for preoperative history and physical. 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 She underwent right hip debridement, Girdlestone procedure, placement of antibiotic spacer, 09/27/23. She was placed on IV cefazolin  per ID. She started having increasing right sided posterior sacroiliac joint pain and so an MRI of her lumbar spine was ordered. The MRI does show evidence of discitis at L3-4, and continued on IV antibiotics. Her PICC line was removed, 11/22/23, and she was placed on a short course of oral cefadroxil . All antibiotics stopped 11/25/23 per ID. No intervention needed for her lumbar spine. Right hip intra-articular aspiration 12/13/23, with synovasure cultures negative, and inflammatory markers within normal limits. She is ready to proceed with resection of right hip antibiotic spacer and conversion to right total hip arthroplasty. Symptoms are rated as moderate to severe, and have been worsening.  This is significantly impairing activities of daily living.  She has elected for surgical management. There is no current active infection.     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/2023   Hypothyroidism    Pneumonia    SVT (supraventricular tachycardia) (HCC)    Tachycardia    Thyroid  disease    Vertebral osteomyelitis (HCC) 10/23/2023   Past Surgical History:  Procedure Laterality Date   BUNIONECTOMY Left 2017   also had R bunionectomy in 1984 approx   TOTAL HIP ARTHROPLASTY Right 09/27/2023   Procedure: DEBRIDEMENT AND PLACEMENT OF ARTICULATING ANTIBIOTIC SPACER;  Surgeon: Adonica Hoose, MD;  Location: [facility];  Service: Orthopedics;  Laterality: Right;   VULVA SURGERY  2019   Social History   Socioeconomic History   Marital status: Married    Spouse name: Not on  file   Number of children: 1   Years of education: Not on file   Highest education level: Some college, no degree  Occupational History   Not on file  Tobacco Use   Smoking status: Never   Smokeless tobacco: Never  Vaping Use   Vaping status: Never Used  Substance and Sexual Activity   Alcohol  use: Yes    Comment: socially   Drug use: Never   Sexual activity: Yes  Other Topics Concern   Not on file  Social History Narrative   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[employer]   Social Drivers of Health   Financial Resource Strain: Low Risk  (10/17/2023)   Overall Financial Resource Strain (CARDIA)    Difficulty of Paying Living Expenses: Not hard at all  Food Insecurity: No Food Insecurity (10/17/2023)   Hunger Vital Sign    Worried About Running Out of Food in the Last Year: Never true    Ran Out of Food in the Last Year: Never true  Transportation Needs: No Transportation Needs (10/17/2023)   PRAPARE - Administrator, Civil Service (Medical): No    Lack of Transportation (Non-Medical): No  Physical Activity: Unknown (10/17/2023)   Exercise Vital Sign    Days of Exercise per Week: Patient declined    Minutes of Exercise per Session: Not on file  Stress: No Stress Concern Present (10/17/2023)   [employer] of Occupational Health - Occupational Stress Questionnaire    Feeling of Stress : Only a little  Social Connections: Moderately Isolated (10/17/2023)   Social Connection and Isolation Panel [NHANES]  Frequency of Communication with Friends and Family: More than three times a week    Frequency of Social Gatherings with Friends and Family: Twice a week    Attends Religious Services: Never    Database administrator or Organizations: No    Attends Engineer, structural: Not on file    Marital Status: Married   Family History  Problem Relation Age of Onset   Cancer Mother    Hypertension Mother    Asthma Daughter    Stroke Maternal Grandmother    Cancer  Maternal Grandfather    Diabetes Paternal Grandmother    Cancer Paternal Grandfather    Hearing loss Paternal Grandfather    Allergies  Allergen Reactions   Sulfa Antibiotics Hives, Rash and Other (See Comments)    "Rash covered the entire body"   Oxycodone  Other (See Comments)    "Very INEFFECTIVE"   Conjugated Estrogens Rash and Dermatitis   Prior to Admission medications   Medication Sig Start Date End Date Taking? Authorizing Provider  acetaminophen  (TYLENOL ) 500 MG tablet Take 500-1,000 mg by mouth every 6 (six) hours as needed (pain.).    [provider]  Cholecalciferol (VITAMIN D-3 PO) Take 1 tablet by mouth daily.    [provider]  Cyanocobalamin (VITAMIN B 12 PO) Take 1 tablet by mouth in the morning.    [provider]  levothyroxine  (SYNTHROID ) 100 MCG tablet Take 100 mcg by mouth daily before breakfast. 11/28/21   [provider]  lubiprostone  (AMITIZA ) 24 MCG capsule TAKE 1 CAPSULE (24 MCG TOTAL) BY MOUTH 2 (TWO) TIMES DAILY WITH A MEAL. Patient taking differently: Take 24 mcg by mouth in the morning. 11/09/23   Rodney Clamp, MD  Multiple Vitamin (MULTIVITAMIN WITH MINERALS) TABS tablet Take 1 tablet by mouth in the morning.    [provider]  Probiotic Product (PROBIOTIC PO) Take 1 capsule by mouth in the morning.    [provider]     Positive ROS: All other systems have been reviewed and were otherwise negative with the exception of those mentioned in the HPI and as above.  Physical Exam: General: Alert, no acute distress Cardiovascular: No pedal edema Respiratory: No cyanosis, no use of accessory musculature GI: No organomegaly, abdomen is soft and non-tender Skin: No lesions in the area of chief complaint Neurologic: Sensation intact distally Psychiatric: Patient is competent for consent with normal mood and affect Lymphatic: No axillary or cervical lymphadenopathy  MUSCULOSKELETAL:  Examination of the  right hip reveals a well healed incision. No drainage or erythema. No wound dehiscence. No subcutaneous fluid collection. No pain with log rolling of the hip. No trochanteric tenderness to palpation.   Sensory and motor function intact in LE bilaterally. Distal pedal pulses 2+ bilaterally.  No significant pedal edema in LE bilaterally. Calves soft and non-tender.  Assessment: Status post right hip girdlestone procedure with placement of articulating antibiotic spacer for MSSA native septic arthritis with extensive acetabular bone involvement, 09/27/23. L3-4 discitis, pain resolved.  Plan: Plan for resection right hip spacer, with conversion to total hip arthroplasty. The risks benefits and alternatives were discussed with the patient including but not limited to the risks of nonoperative treatment, versus surgical intervention including infection, bleeding, nerve injury,  blood clots, cardiopulmonary complications, morbidity, mortality, among others, and they were willing to proceed. Plan for discharge home with HEP after an overnight stay.   Therapy Plans: HEP.  Disposition: Home with husband Planned DVT Prophylaxis: aspirin  81mg   BID DME needed: Has rolling walker and wheelchair.  PCP: Cleared.  TXA: IV Allergies:  - Estrogens - rash. - Oxycodone  - ineffective, stomach issues.  - Sulfa antibiotics - hives.  Anesthesia Concerns: None.  BMI: 23.3 Last HgbA1c: Not diabetic.  Other: - NSA, MSSA.  - PO keflex 6-12 months.  - Protected with TDWB 6 weeks with progression over 12 weeks with WB.  - Hydrocodone, zofran , methocarbamol , ibuprofen .  - 01/11/24: Hgb 13.1, K+ 4.3, Cr. 0.74.    Harman Lightning, PA-C [phone]   01/11/2024 11:25 AM

## 2024-01-11 NOTE — H&P (Signed)
 PREOPERATIVE H&P  Chief Complaint: right hip pain  HPI: Melanie Li is a 58 y.o. female who presents for preoperative history and physical. 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 She underwent right hip debridement, Girdlestone procedure, placement of antibiotic spacer, 09/27/23. She was placed on IV cefazolin  per ID. She started having increasing right sided posterior sacroiliac joint pain and so an MRI of her lumbar spine was ordered. The MRI does show evidence of discitis at L3-4, and continued on IV antibiotics. Her PICC line was removed, 11/22/23, and she was placed on a short course of oral cefadroxil . All antibiotics stopped 11/25/23 per ID. No intervention needed for her lumbar spine. Right hip intra-articular aspiration 12/13/23, with synovasure cultures negative, and inflammatory markers within normal limits. She is ready to proceed with resection of right hip antibiotic spacer and conversion to right total hip arthroplasty. Symptoms are rated as moderate to severe, and have been worsening.  This is significantly impairing activities of daily living.  She has elected for surgical management. There is no current active infection.     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/2023   Hypothyroidism    Pneumonia    SVT (supraventricular tachycardia) (HCC)    Tachycardia    Thyroid  disease    Vertebral osteomyelitis (HCC) 10/23/2023   Past Surgical History:  Procedure Laterality Date   BUNIONECTOMY Left 2017   also had R bunionectomy in 1984 approx   TOTAL HIP ARTHROPLASTY Right 09/27/2023   Procedure: DEBRIDEMENT AND PLACEMENT OF ARTICULATING ANTIBIOTIC SPACER;  Surgeon: Adonica Hoose, MD;  Location: [facility];  Service: Orthopedics;  Laterality: Right;   VULVA SURGERY  2019   Social History   Socioeconomic History   Marital status: Married    Spouse name: Not on  file   Number of children: 1   Years of education: Not on file   Highest education level: Some college, no degree  Occupational History   Not on file  Tobacco Use   Smoking status: Never   Smokeless tobacco: Never  Vaping Use   Vaping status: Never Used  Substance and Sexual Activity   Alcohol  use: Yes    Comment: socially   Drug use: Never   Sexual activity: Yes  Other Topics Concern   Not on file  Social History Narrative   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[employer]   Social Drivers of Health   Financial Resource Strain: Low Risk  (10/17/2023)   Overall Financial Resource Strain (CARDIA)    Difficulty of Paying Living Expenses: Not hard at all  Food Insecurity: No Food Insecurity (10/17/2023)   Hunger Vital Sign    Worried About Running Out of Food in the Last Year: Never true    Ran Out of Food in the Last Year: Never true  Transportation Needs: No Transportation Needs (10/17/2023)   PRAPARE - Administrator, Civil Service (Medical): No    Lack of Transportation (Non-Medical): No  Physical Activity: Unknown (10/17/2023)   Exercise Vital Sign    Days of Exercise per Week: Patient declined    Minutes of Exercise per Session: Not on file  Stress: No Stress Concern Present (10/17/2023)   [employer] of Occupational Health - Occupational Stress Questionnaire    Feeling of Stress : Only a little  Social Connections: Moderately Isolated (10/17/2023)   Social Connection and Isolation Panel [NHANES]  Frequency of Communication with Friends and Family: More than three times a week    Frequency of Social Gatherings with Friends and Family: Twice a week    Attends Religious Services: Never    Database administrator or Organizations: No    Attends Engineer, structural: Not on file    Marital Status: Married   Family History  Problem Relation Age of Onset   Cancer Mother    Hypertension Mother    Asthma Daughter    Stroke Maternal Grandmother    Cancer  Maternal Grandfather    Diabetes Paternal Grandmother    Cancer Paternal Grandfather    Hearing loss Paternal Grandfather    Allergies  Allergen Reactions   Sulfa Antibiotics Hives, Rash and Other (See Comments)    "Rash covered the entire body"   Oxycodone  Other (See Comments)    "Very INEFFECTIVE"   Conjugated Estrogens Rash and Dermatitis   Prior to Admission medications   Medication Sig Start Date End Date Taking? Authorizing Provider  acetaminophen  (TYLENOL ) 500 MG tablet Take 500-1,000 mg by mouth every 6 (six) hours as needed (pain.).    [provider]  Cholecalciferol (VITAMIN D-3 PO) Take 1 tablet by mouth daily.    [provider]  Cyanocobalamin (VITAMIN B 12 PO) Take 1 tablet by mouth in the morning.    [provider]  levothyroxine  (SYNTHROID ) 100 MCG tablet Take 100 mcg by mouth daily before breakfast. 11/28/21   [provider]  lubiprostone  (AMITIZA ) 24 MCG capsule TAKE 1 CAPSULE (24 MCG TOTAL) BY MOUTH 2 (TWO) TIMES DAILY WITH A MEAL. Patient taking differently: Take 24 mcg by mouth in the morning. 11/09/23   Rodney Clamp, MD  Multiple Vitamin (MULTIVITAMIN WITH MINERALS) TABS tablet Take 1 tablet by mouth in the morning.    [provider]  Probiotic Product (PROBIOTIC PO) Take 1 capsule by mouth in the morning.    [provider]     Positive ROS: All other systems have been reviewed and were otherwise negative with the exception of those mentioned in the HPI and as above.  Physical Exam: General: Alert, no acute distress Cardiovascular: No pedal edema Respiratory: No cyanosis, no use of accessory musculature GI: No organomegaly, abdomen is soft and non-tender Skin: No lesions in the area of chief complaint Neurologic: Sensation intact distally Psychiatric: Patient is competent for consent with normal mood and affect Lymphatic: No axillary or cervical lymphadenopathy  MUSCULOSKELETAL:  Examination of the  right hip reveals a well healed incision. No drainage or erythema. No wound dehiscence. No subcutaneous fluid collection. No pain with log rolling of the hip. No trochanteric tenderness to palpation.   Sensory and motor function intact in LE bilaterally. Distal pedal pulses 2+ bilaterally.  No significant pedal edema in LE bilaterally. Calves soft and non-tender.  Assessment: Status post right hip girdlestone procedure with placement of articulating antibiotic spacer for MSSA native septic arthritis with extensive acetabular bone involvement, 09/27/23. L3-4 discitis, pain resolved.  Plan: Plan for resection right hip spacer, with conversion to total hip arthroplasty. The risks benefits and alternatives were discussed with the patient including but not limited to the risks of nonoperative treatment, versus surgical intervention including infection, bleeding, nerve injury,  blood clots, cardiopulmonary complications, morbidity, mortality, among others, and they were willing to proceed. Plan for discharge home with HEP after an overnight stay.   Therapy Plans: HEP.  Disposition: Home with husband Planned DVT Prophylaxis: aspirin  81mg   BID DME needed: Has rolling walker and wheelchair.  PCP: Cleared.  TXA: IV Allergies:  - Estrogens - rash. - Oxycodone  - ineffective, stomach issues.  - Sulfa antibiotics - hives.  Anesthesia Concerns: None.  BMI: 23.3 Last HgbA1c: Not diabetic.  Other: - NSA, MSSA.  - PO keflex 6-12 months.  - Protected with TDWB 6 weeks with progression over 12 weeks with WB.  - Hydrocodone, zofran , methocarbamol , ibuprofen .  - 01/11/24: Hgb 13.1, K+ 4.3, Cr. 0.74.    Harman Lightning, PA-C [phone]   01/11/2024 11:25 AM

## 2024-01-11 NOTE — Telephone Encounter (Signed)
 Pt received a bill for $190 stating it was for [facility] paperwork. Pt already spoke with billing & told them to call us . Please advise

## 2024-01-11 NOTE — Progress Notes (Signed)
 For Anesthesia: PCP - Christel Cousins, MD  Cardiologist - Tammie Fall, MD . Holli Lunger: 02/10/22  Bowel Prep reminder:  Chest x-ray -  EKG - 08/20/23 Stress Test -  ECHO - 09/24/23 Cardiac Cath -  Pacemaker/ICD device last checked: Pacemaker orders received: Device Rep notified:  Spinal Cord Stimulator:N/A  Sleep Study - N/A CPAP -   Fasting Blood Sugar - N/A Checks Blood Sugar _____ times a day Date and result of last Hgb A1c-  Last dose of GLP1 agonist- N/A GLP1 instructions:   Last dose of SGLT-2 inhibitors- N/A SGLT-2 instructions:   Blood Thinner Instructions:N/A Aspirin  Instructions: Last Dose:  Activity level: Can go up a flight of stairs and activities of daily living without stopping and without chest pain and/or shortness of breath      Unable to go up a flight of stairs due to hip pain.     Anesthesia review: Hx: SVT  Patient denies shortness of breath, fever, cough and chest pain at PAT appointment   Patient verbalized understanding of instructions that were given to them at the PAT appointment. Patient was also instructed that they will need to review over the PAT instructions again at home before surgery.

## 2024-01-12 DIAGNOSIS — E039 Hypothyroidism, unspecified: Secondary | ICD-10-CM | POA: Diagnosis not present

## 2024-01-12 NOTE — Anesthesia Preprocedure Evaluation (Signed)
 Anesthesia Evaluation  Patient identified by MRN, date of birth, ID band Patient awake    Reviewed: Allergy & Precautions, NPO status , Patient's Chart, lab work & pertinent test results  Airway Mallampati: III  TM Distance: >3 FB Neck ROM: Full    Dental no notable dental hx.    Pulmonary neg pulmonary ROS   Pulmonary exam normal        Cardiovascular negative cardio ROS Normal cardiovascular exam     Neuro/Psych negative neurological ROS  negative psych ROS   GI/Hepatic negative GI ROS, Neg liver ROS,,,  Endo/Other  Hypothyroidism    Renal/GU negative Renal ROS     Musculoskeletal  (+) Arthritis ,  Vertebral osteomyelitis   Abdominal   Peds  Hematology negative hematology ROS (+)   Anesthesia Other Findings Right hip native spacer antiobiotic  Reproductive/Obstetrics                             Anesthesia Physical Anesthesia Plan  ASA: 2  Anesthesia Plan: General   Post-op Pain Management:    Induction: Intravenous  PONV Risk Score and Plan: 3 and Ondansetron , Dexamethasone , Midazolam  and Treatment may vary due to age or medical condition  Airway Management Planned: Oral ETT  Additional Equipment:   Intra-op Plan:   Post-operative Plan: Extubation in OR  Informed Consent: I have reviewed the patients History and Physical, chart, labs and discussed the procedure including the risks, benefits and alternatives for the proposed anesthesia with the patient or authorized representative who has indicated his/her understanding and acceptance.     Dental advisory given  Plan Discussed with: CRNA  Anesthesia Plan Comments: (Pt s/p debridement and placement of antibiotic space 09/27/2023.   MRI with evidence of discitis at L3-4, continued on IV antibiotics.  PICC line removed 11/22/23, placed on short course of oral cefadroxil . All antibiotics discontinued 11/25/23 per ID. No further  intervention recommended for lumbar spine. )       Anesthesia Quick Evaluation

## 2024-01-12 NOTE — Progress Notes (Signed)
 Anesthesia Chart Review   Case: [id number] Date/Time: 01/18/24 0715   Procedure: REVISION, TOTAL ARTHROPLASTY, HIP, ANTERIOR APPROACH (Right: Hip)   Anesthesia type: Spinal   Pre-op diagnosis: Right hip native spacer antiobiotic   Location: [facility] ROOM 08 / [facility]   Surgeons: Adonica Hoose, MD       DISCUSSION:58 y.o. never smoker with h/o hypothyroidism, right hip arthroplasty infection scheduled for above procedure 01/18/2024.   Pt s/p debridement and placement of antibiotic space 09/27/2023.   MRI with evidence of discitis at L3-4, continued on IV antibiotics.  PICC line removed 11/22/23, placed on short course of oral cefadroxil . All antibiotics discontinued 11/25/23 per ID. No further intervention recommended for lumbar spine.  VS: BP 114/81   Pulse 73   Temp 36.7 C (Oral)   Ht 5\' 8"  (1.727 m)   Wt 64.4 kg   LMP  (LMP Unknown)   SpO2 100%   BMI 21.59 kg/m   PROVIDERS: Christel Cousins, MD is PCP    LABS: Labs reviewed: Acceptable for surgery. (all labs ordered are listed, but only abnormal results are displayed)  Labs Reviewed  SURGICAL PCR SCREEN  CBC  BASIC METABOLIC PANEL WITH GFR  TYPE AND SCREEN     IMAGES:   EKG:   CV:  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/2023   Hypothyroidism    Pneumonia    SVT (supraventricular tachycardia) (HCC)    Tachycardia    Thyroid  disease    Vertebral osteomyelitis (HCC) 10/23/2023    Past Surgical History:  Procedure Laterality Date   BUNIONECTOMY Left 2017   also had R bunionectomy in 1984 approx   TOTAL HIP ARTHROPLASTY Right 09/27/2023   Procedure: DEBRIDEMENT AND PLACEMENT OF ARTICULATING ANTIBIOTIC SPACER;  Surgeon: Adonica Hoose, MD;  Location: [facility];  Service: Orthopedics;  Laterality: Right;   VULVA SURGERY  2019    MEDICATIONS:  acetaminophen  (TYLENOL ) 500 MG tablet   Cholecalciferol (VITAMIN D-3 PO)   Cyanocobalamin (VITAMIN B 12 PO)   levothyroxine   (SYNTHROID ) 100 MCG tablet   lubiprostone  (AMITIZA ) 24 MCG capsule   Multiple Vitamin (MULTIVITAMIN WITH MINERALS) TABS tablet   Probiotic Product (PROBIOTIC PO)   No current facility-administered medications for this encounter.     Chick Cotton Ward, PA-C [facility] [phone]

## 2024-01-17 NOTE — Telephone Encounter (Signed)
 Patient requests to be called re: Status of $190.00 invoice she received.  Per Patient, [employer] told Patient the invoice was coded G0180 which states Medicare Certification Home Health Aide. Patient states she does not have Medicare or had any Home Health paperwork or need for it.

## 2024-01-18 ENCOUNTER — Ambulatory Visit (HOSPITAL_COMMUNITY)

## 2024-01-18 ENCOUNTER — Ambulatory Visit (HOSPITAL_COMMUNITY): Payer: Self-pay | Admitting: Physician Assistant

## 2024-01-18 ENCOUNTER — Other Ambulatory Visit: Payer: Self-pay

## 2024-01-18 ENCOUNTER — Inpatient Hospital Stay (HOSPITAL_COMMUNITY)
Admission: RE | Admit: 2024-01-18 | Discharge: 2024-01-23 | DRG: 470 | Disposition: A | Discharge status: Home Health | Attending: Orthopedic Surgery | Admitting: Orthopedic Surgery

## 2024-01-18 ENCOUNTER — Encounter (HOSPITAL_COMMUNITY)
Admission: RE | Disposition: A | Discharge status: Home Health | Payer: Self-pay | Source: Home / Self Care | Attending: Orthopedic Surgery

## 2024-01-18 ENCOUNTER — Ambulatory Visit (HOSPITAL_COMMUNITY): Admitting: Certified Registered"

## 2024-01-18 ENCOUNTER — Encounter (HOSPITAL_COMMUNITY): Payer: Self-pay | Admitting: Orthopedic Surgery

## 2024-01-18 DIAGNOSIS — M00051 Staphylococcal arthritis, right hip: Principal | ICD-10-CM | POA: Diagnosis present

## 2024-01-18 DIAGNOSIS — M1611 Unilateral primary osteoarthritis, right hip: Secondary | ICD-10-CM | POA: Diagnosis present

## 2024-01-18 DIAGNOSIS — T8451XA Infection and inflammatory reaction due to internal right hip prosthesis, initial encounter: Secondary | ICD-10-CM

## 2024-01-18 DIAGNOSIS — M4646 Discitis, unspecified, lumbar region: Secondary | ICD-10-CM | POA: Diagnosis present

## 2024-01-18 DIAGNOSIS — Z96641 Presence of right artificial hip joint: Secondary | ICD-10-CM | POA: Diagnosis present

## 2024-01-18 DIAGNOSIS — Z79890 Hormone replacement therapy (postmenopausal): Secondary | ICD-10-CM

## 2024-01-18 DIAGNOSIS — Z471 Aftercare following joint replacement surgery: Secondary | ICD-10-CM | POA: Diagnosis not present

## 2024-01-18 DIAGNOSIS — Z825 Family history of asthma and other chronic lower respiratory diseases: Secondary | ICD-10-CM

## 2024-01-18 DIAGNOSIS — R519 Headache, unspecified: Secondary | ICD-10-CM | POA: Diagnosis not present

## 2024-01-18 DIAGNOSIS — E039 Hypothyroidism, unspecified: Secondary | ICD-10-CM | POA: Diagnosis present

## 2024-01-18 DIAGNOSIS — Z8249 Family history of ischemic heart disease and other diseases of the circulatory system: Secondary | ICD-10-CM

## 2024-01-18 DIAGNOSIS — Z823 Family history of stroke: Secondary | ICD-10-CM

## 2024-01-18 DIAGNOSIS — Z885 Allergy status to narcotic agent status: Secondary | ICD-10-CM

## 2024-01-18 DIAGNOSIS — D62 Acute posthemorrhagic anemia: Secondary | ICD-10-CM | POA: Diagnosis not present

## 2024-01-18 DIAGNOSIS — Z888 Allergy status to other drugs, medicaments and biological substances status: Secondary | ICD-10-CM

## 2024-01-18 DIAGNOSIS — Z882 Allergy status to sulfonamides status: Secondary | ICD-10-CM

## 2024-01-18 DIAGNOSIS — M00851 Arthritis due to other bacteria, right hip: Secondary | ICD-10-CM | POA: Diagnosis not present

## 2024-01-18 DIAGNOSIS — Z833 Family history of diabetes mellitus: Secondary | ICD-10-CM

## 2024-01-18 LAB — POCT I-STAT EG7
Acid-base deficit: 3 mmol/L — ABNORMAL HIGH (ref 0.0–2.0)
Bicarbonate: 23.6 mmol/L (ref 20.0–28.0)
Calcium, Ion: 1.23 mmol/L (ref 1.15–1.40)
HCT: 22 % — ABNORMAL LOW (ref 36.0–46.0)
Hemoglobin: 7.5 g/dL — ABNORMAL LOW (ref 12.0–15.0)
O2 Saturation: 94 %
Potassium: 4 mmol/L (ref 3.5–5.1)
Sodium: 138 mmol/L (ref 135–145)
TCO2: 25 mmol/L (ref 22–32)
pCO2, Ven: 47.1 mmHg (ref 44–60)
pH, Ven: 7.309 (ref 7.25–7.43)
pO2, Ven: 80 mmHg — ABNORMAL HIGH (ref 32–45)

## 2024-01-18 LAB — PREPARE RBC (CROSSMATCH)

## 2024-01-18 SURGERY — REVISION, TOTAL ARTHROPLASTY, HIP, ANTERIOR APPROACH
Anesthesia: General | Site: Hip | Laterality: Right

## 2024-01-18 MED ORDER — ROCURONIUM BROMIDE 10 MG/ML (PF) SYRINGE
PREFILLED_SYRINGE | INTRAVENOUS | Status: DC | PRN
  Administered 2024-01-18: 10 mg via INTRAVENOUS
  Administered 2024-01-18 (×3): 20 mg via INTRAVENOUS
  Administered 2024-01-18: 60 mg via INTRAVENOUS
  Administered 2024-01-18: 20 mg via INTRAVENOUS
  Administered 2024-01-18: 10 mg via INTRAVENOUS

## 2024-01-18 MED ORDER — HYDROCODONE-ACETAMINOPHEN 7.5-325 MG PO TABS: 1.0000 | ORAL_TABLET | ORAL | Status: DC | PRN

## 2024-01-18 MED ORDER — HYDROMORPHONE HCL 1 MG/ML IJ SOLN
INTRAMUSCULAR | Status: AC
  Filled 2024-01-18: qty 1

## 2024-01-18 MED ORDER — 0.9 % SODIUM CHLORIDE (POUR BTL) OPTIME
TOPICAL | Status: DC | PRN
  Administered 2024-01-18: 1000 mL

## 2024-01-18 MED ORDER — ACETAMINOPHEN 325 MG PO TABS
325.0000 mg | ORAL_TABLET | Freq: Four times a day (QID) | ORAL | Status: DC | PRN
  Administered 2024-01-21 – 2024-01-23 (×2): 650 mg via ORAL
  Filled 2024-01-18 (×2): qty 2

## 2024-01-18 MED ORDER — FENTANYL CITRATE PF 50 MCG/ML IJ SOSY
25.0000 ug | PREFILLED_SYRINGE | INTRAMUSCULAR | Status: DC | PRN
  Administered 2024-01-18 (×3): 50 ug via INTRAVENOUS

## 2024-01-18 MED ORDER — ADULT MULTIVITAMIN W/MINERALS CH
1.0000 | ORAL_TABLET | Freq: Every day | ORAL | Status: DC
  Administered 2024-01-19 – 2024-01-23 (×5): 1 via ORAL
  Filled 2024-01-18 (×5): qty 1

## 2024-01-18 MED ORDER — FENTANYL CITRATE (PF) 100 MCG/2ML IJ SOLN
INTRAMUSCULAR | Status: DC | PRN
  Administered 2024-01-18: 100 ug via INTRAVENOUS

## 2024-01-18 MED ORDER — ONDANSETRON HCL 4 MG PO TABS: 4.0000 mg | ORAL_TABLET | Freq: Four times a day (QID) | ORAL | Status: DC | PRN

## 2024-01-18 MED ORDER — ALBUMIN HUMAN 5 % IV SOLN
INTRAVENOUS | Status: DC | PRN
Start: 2024-01-18 — End: 2024-01-18

## 2024-01-18 MED ORDER — PHENOL 1.4 % MT LIQD: 1.0000 | OROMUCOSAL | Status: DC | PRN

## 2024-01-18 MED ORDER — ASPIRIN 81 MG PO CHEW
81.0000 mg | CHEWABLE_TABLET | Freq: Two times a day (BID) | ORAL | Status: DC
  Administered 2024-01-18 – 2024-01-23 (×10): 81 mg via ORAL
  Filled 2024-01-18 (×10): qty 1

## 2024-01-18 MED ORDER — ACETAMINOPHEN 10 MG/ML IV SOLN
INTRAVENOUS | Status: AC
  Filled 2024-01-18: qty 100

## 2024-01-18 MED ORDER — SENNA 8.6 MG PO TABS
1.0000 | ORAL_TABLET | Freq: Two times a day (BID) | ORAL | Status: DC
  Administered 2024-01-18 – 2024-01-23 (×11): 8.6 mg via ORAL
  Filled 2024-01-18 (×11): qty 1

## 2024-01-18 MED ORDER — PROPOFOL 10 MG/ML IV BOLUS
INTRAVENOUS | Status: DC | PRN
  Administered 2024-01-18: 200 mg via INTRAVENOUS

## 2024-01-18 MED ORDER — SODIUM CHLORIDE (PF) 0.9 % IJ SOLN
INTRAMUSCULAR | Status: AC
  Filled 2024-01-18: qty 30

## 2024-01-18 MED ORDER — DEXAMETHASONE SODIUM PHOSPHATE 10 MG/ML IJ SOLN
INTRAMUSCULAR | Status: AC
  Filled 2024-01-18: qty 1

## 2024-01-18 MED ORDER — METHOCARBAMOL 1000 MG/10ML IJ SOLN: 500.0000 mg | Freq: Four times a day (QID) | INTRAMUSCULAR | Status: DC | PRN

## 2024-01-18 MED ORDER — TOBRAMYCIN SULFATE 1.2 G IJ SOLR
INTRAMUSCULAR | Status: AC
  Filled 2024-01-18: qty 1.2

## 2024-01-18 MED ORDER — DEXAMETHASONE SODIUM PHOSPHATE 10 MG/ML IJ SOLN
INTRAMUSCULAR | Status: DC | PRN
Start: 2024-01-18 — End: 2024-01-18
  Administered 2024-01-18: 10 mg via INTRAVENOUS

## 2024-01-18 MED ORDER — CEFAZOLIN SODIUM-DEXTROSE 2-4 GM/100ML-% IV SOLN
2.0000 g | INTRAVENOUS | Status: AC
  Administered 2024-01-18: 2 g via INTRAVENOUS
  Filled 2024-01-18: qty 100

## 2024-01-18 MED ORDER — MORPHINE SULFATE (PF) 2 MG/ML IV SOLN
0.5000 mg | INTRAVENOUS | Status: DC | PRN
  Administered 2024-01-18: 0.5 mg via INTRAVENOUS
  Filled 2024-01-18: qty 1

## 2024-01-18 MED ORDER — PHENYLEPHRINE HCL (PRESSORS) 10 MG/ML IV SOLN
INTRAVENOUS | Status: AC
  Filled 2024-01-18: qty 1

## 2024-01-18 MED ORDER — ALUM & MAG HYDROXIDE-SIMETH 200-200-20 MG/5ML PO SUSP
30.0000 mL | ORAL | Status: DC | PRN
  Administered 2024-01-20: 30 mL via ORAL
  Filled 2024-01-18: qty 30

## 2024-01-18 MED ORDER — MIDAZOLAM HCL 2 MG/2ML IJ SOLN
INTRAMUSCULAR | Status: AC
  Filled 2024-01-18: qty 2

## 2024-01-18 MED ORDER — SODIUM CHLORIDE 0.9 % IV SOLN: INTRAVENOUS | Status: DC

## 2024-01-18 MED ORDER — ONDANSETRON HCL 4 MG/2ML IJ SOLN
INTRAMUSCULAR | Status: AC
  Filled 2024-01-18: qty 2

## 2024-01-18 MED ORDER — CEFAZOLIN SODIUM-DEXTROSE 2-4 GM/100ML-% IV SOLN
2.0000 g | Freq: Four times a day (QID) | INTRAVENOUS | Status: AC
  Administered 2024-01-18 (×2): 2 g via INTRAVENOUS
  Filled 2024-01-18 (×2): qty 100

## 2024-01-18 MED ORDER — KETOROLAC TROMETHAMINE 30 MG/ML IJ SOLN
INTRAMUSCULAR | Status: AC
  Filled 2024-01-18: qty 1

## 2024-01-18 MED ORDER — HYDROMORPHONE HCL 1 MG/ML IJ SOLN
0.5000 mg | INTRAMUSCULAR | Status: DC | PRN
  Administered 2024-01-18 – 2024-01-19 (×2): 0.5 mg via INTRAVENOUS
  Filled 2024-01-18 (×2): qty 0.5

## 2024-01-18 MED ORDER — MENTHOL 3 MG MT LOZG: 1.0000 | LOZENGE | OROMUCOSAL | Status: DC | PRN

## 2024-01-18 MED ORDER — ONDANSETRON HCL 4 MG/2ML IJ SOLN
INTRAMUSCULAR | Status: DC | PRN
Start: 2024-01-18 — End: 2024-01-18
  Administered 2024-01-18: 4 mg via INTRAVENOUS

## 2024-01-18 MED ORDER — PHENYLEPHRINE HCL-NACL 20-0.9 MG/250ML-% IV SOLN
INTRAVENOUS | Status: DC | PRN
Start: 2024-01-18 — End: 2024-01-18
  Administered 2024-01-18: 50 ug/min via INTRAVENOUS

## 2024-01-18 MED ORDER — POVIDONE-IODINE 10 % EX SWAB: 2.0000 | Freq: Once | CUTANEOUS | Status: DC

## 2024-01-18 MED ORDER — STERILE WATER FOR IRRIGATION IR SOLN
Status: DC | PRN
  Administered 2024-01-18: 1000 mL

## 2024-01-18 MED ORDER — ONDANSETRON HCL 4 MG/2ML IJ SOLN
4.0000 mg | Freq: Four times a day (QID) | INTRAMUSCULAR | Status: DC | PRN
  Administered 2024-01-18: 4 mg via INTRAVENOUS
  Filled 2024-01-18: qty 2

## 2024-01-18 MED ORDER — POLYETHYLENE GLYCOL 3350 17 G PO PACK
17.0000 g | PACK | Freq: Every day | ORAL | Status: DC | PRN
Start: 2024-01-18 — End: 2024-01-23
  Administered 2024-01-20: 17 g via ORAL
  Filled 2024-01-18: qty 1

## 2024-01-18 MED ORDER — METHOCARBAMOL 500 MG PO TABS
500.0000 mg | ORAL_TABLET | Freq: Four times a day (QID) | ORAL | Status: DC | PRN
  Administered 2024-01-19 – 2024-01-22 (×7): 500 mg via ORAL
  Filled 2024-01-18 (×7): qty 1

## 2024-01-18 MED ORDER — FENTANYL CITRATE PF 50 MCG/ML IJ SOSY
PREFILLED_SYRINGE | INTRAMUSCULAR | Status: AC
Start: 2024-01-18 — End: ?
  Filled 2024-01-18: qty 3

## 2024-01-18 MED ORDER — FENTANYL CITRATE (PF) 100 MCG/2ML IJ SOLN
INTRAMUSCULAR | Status: AC
Start: 2024-01-18 — End: ?
  Filled 2024-01-18: qty 2

## 2024-01-18 MED ORDER — SUGAMMADEX SODIUM 500 MG/5ML IV SOLN
INTRAVENOUS | Status: DC | PRN
Start: 2024-01-18 — End: 2024-01-18
  Administered 2024-01-18: 200 mg via INTRAVENOUS

## 2024-01-18 MED ORDER — BISACODYL 10 MG RE SUPP
10.0000 mg | Freq: Every day | RECTAL | Status: DC | PRN
  Administered 2024-01-20: 10 mg via RECTAL
  Filled 2024-01-18: qty 1

## 2024-01-18 MED ORDER — CHLORHEXIDINE GLUCONATE 0.12 % MT SOLN
15.0000 mL | Freq: Once | OROMUCOSAL | Status: AC
  Administered 2024-01-18: 15 mL via OROMUCOSAL

## 2024-01-18 MED ORDER — HYDROCODONE-ACETAMINOPHEN 5-325 MG PO TABS
1.0000 | ORAL_TABLET | ORAL | Status: DC | PRN
  Administered 2024-01-18 – 2024-01-19 (×2): 1 via ORAL
  Administered 2024-01-19 (×2): 2 via ORAL
  Administered 2024-01-19 – 2024-01-20 (×2): 1 via ORAL
  Filled 2024-01-18: qty 1
  Filled 2024-01-18 (×2): qty 2
  Filled 2024-01-18 (×4): qty 1

## 2024-01-18 MED ORDER — PANTOPRAZOLE SODIUM 40 MG PO TBEC
40.0000 mg | DELAYED_RELEASE_TABLET | Freq: Every day | ORAL | Status: DC
  Administered 2024-01-18 – 2024-01-23 (×6): 40 mg via ORAL
  Filled 2024-01-18 (×6): qty 1

## 2024-01-18 MED ORDER — CEPHALEXIN 500 MG PO CAPS
500.0000 mg | ORAL_CAPSULE | Freq: Two times a day (BID) | ORAL | Status: DC
  Administered 2024-01-19: 500 mg via ORAL
  Filled 2024-01-18: qty 1

## 2024-01-18 MED ORDER — LACTATED RINGERS IV SOLN: INTRAVENOUS | Status: DC

## 2024-01-18 MED ORDER — METOCLOPRAMIDE HCL 5 MG/ML IJ SOLN: 5.0000 mg | Freq: Three times a day (TID) | INTRAMUSCULAR | Status: DC | PRN

## 2024-01-18 MED ORDER — LEVOTHYROXINE SODIUM 100 MCG PO TABS
100.0000 ug | ORAL_TABLET | Freq: Every day | ORAL | Status: DC
  Administered 2024-01-19 – 2024-01-23 (×5): 100 ug via ORAL
  Filled 2024-01-18 (×5): qty 1

## 2024-01-18 MED ORDER — ORAL CARE MOUTH RINSE: 15.0000 mL | Freq: Once | OROMUCOSAL | Status: AC

## 2024-01-18 MED ORDER — ACETAMINOPHEN 10 MG/ML IV SOLN
1000.0000 mg | Freq: Once | INTRAVENOUS | Status: DC | PRN
  Administered 2024-01-18: 1000 mg via INTRAVENOUS

## 2024-01-18 MED ORDER — SODIUM CHLORIDE 0.9% IV SOLUTION: Freq: Once | INTRAVENOUS | Status: AC

## 2024-01-18 MED ORDER — DIPHENHYDRAMINE HCL 12.5 MG/5ML PO ELIX
12.5000 mg | ORAL_SOLUTION | ORAL | Status: DC | PRN
  Administered 2024-01-21: 25 mg via ORAL
  Filled 2024-01-18: qty 10

## 2024-01-18 MED ORDER — AMISULPRIDE (ANTIEMETIC) 5 MG/2ML IV SOLN: 10.0000 mg | Freq: Once | INTRAVENOUS | Status: DC | PRN

## 2024-01-18 MED ORDER — FERROUS SULFATE 325 (65 FE) MG PO TABS
325.0000 mg | ORAL_TABLET | Freq: Three times a day (TID) | ORAL | Status: DC
  Administered 2024-01-19 – 2024-01-23 (×13): 325 mg via ORAL
  Filled 2024-01-18 (×13): qty 1

## 2024-01-18 MED ORDER — TOBRAMYCIN SULFATE 1.2 G IJ SOLR
INTRAMUSCULAR | Status: DC | PRN
  Administered 2024-01-18: 1.2 g

## 2024-01-18 MED ORDER — DOCUSATE SODIUM 100 MG PO CAPS
100.0000 mg | ORAL_CAPSULE | Freq: Two times a day (BID) | ORAL | Status: DC
  Administered 2024-01-18 – 2024-01-23 (×10): 100 mg via ORAL
  Filled 2024-01-18 (×10): qty 1

## 2024-01-18 MED ORDER — MIDAZOLAM HCL 5 MG/5ML IJ SOLN
INTRAMUSCULAR | Status: DC | PRN
  Administered 2024-01-18: 2 mg via INTRAVENOUS

## 2024-01-18 MED ORDER — METOCLOPRAMIDE HCL 5 MG PO TABS: 5.0000 mg | ORAL_TABLET | Freq: Three times a day (TID) | ORAL | Status: DC | PRN

## 2024-01-18 MED ORDER — PHENYLEPHRINE 80 MCG/ML (10ML) SYRINGE FOR IV PUSH (FOR BLOOD PRESSURE SUPPORT)
PREFILLED_SYRINGE | INTRAVENOUS | Status: DC | PRN
  Administered 2024-01-18 (×3): 160 ug via INTRAVENOUS
  Administered 2024-01-18: 240 ug via INTRAVENOUS
  Administered 2024-01-18 (×2): 160 ug via INTRAVENOUS
  Administered 2024-01-18: 80 ug via INTRAVENOUS

## 2024-01-18 MED ORDER — TRANEXAMIC ACID-NACL 1000-0.7 MG/100ML-% IV SOLN
1000.0000 mg | INTRAVENOUS | Status: AC
  Administered 2024-01-18: 1000 mg via INTRAVENOUS
  Filled 2024-01-18: qty 100

## 2024-01-18 MED ORDER — ISOPROPYL ALCOHOL 70 % SOLN
Status: DC | PRN
Start: 2024-01-18 — End: 2024-01-18
  Administered 2024-01-18: 1 via TOPICAL

## 2024-01-18 MED ORDER — ACETAMINOPHEN 500 MG PO TABS
1000.0000 mg | ORAL_TABLET | Freq: Once | ORAL | Status: AC
Start: 2024-01-18 — End: 2024-01-18
  Administered 2024-01-18: 1000 mg via ORAL
  Filled 2024-01-18: qty 2

## 2024-01-18 MED ORDER — INDOMETHACIN 25 MG PO CAPS
50.0000 mg | ORAL_CAPSULE | Freq: Two times a day (BID) | ORAL | Status: DC
  Administered 2024-01-19 – 2024-01-23 (×9): 50 mg via ORAL
  Filled 2024-01-18 (×10): qty 2

## 2024-01-18 MED ORDER — BUPIVACAINE-EPINEPHRINE (PF) 0.25% -1:200000 IJ SOLN
INTRAMUSCULAR | Status: AC
  Filled 2024-01-18: qty 30

## 2024-01-18 MED ORDER — SODIUM CHLORIDE 0.9 % IR SOLN
Status: DC | PRN
Start: 2024-01-18 — End: 2024-01-18
  Administered 2024-01-18: 3000 mL

## 2024-01-18 MED ORDER — HYDROMORPHONE HCL 1 MG/ML IJ SOLN: 0.2500 mg | INTRAMUSCULAR | Status: DC | PRN

## 2024-01-18 MED ORDER — LIDOCAINE HCL (CARDIAC) PF 100 MG/5ML IV SOSY
PREFILLED_SYRINGE | INTRAVENOUS | Status: DC | PRN
  Administered 2024-01-18: 60 mg via INTRATRACHEAL

## 2024-01-18 SURGICAL SUPPLY — 57 items
BAG COUNTER SPONGE SURGICOUNT (BAG) IMPLANT
BAG ZIPLOCK 12X15 (MISCELLANEOUS) IMPLANT
BEARING CROSSLINK RSA 28X44 (Joint) IMPLANT
BIT DRILL RINGLOC QUICK CONN (BIT) IMPLANT
BLADE SAW SGTL 11.0X1.19X90.0M (BLADE) IMPLANT
CHIPS CORTICOCANCELLOUS 30CC (Bone Implant) ×2 IMPLANT
CHLORAPREP W/TINT 26 (MISCELLANEOUS) ×1 IMPLANT
COVER PERINEAL POST (MISCELLANEOUS) ×1 IMPLANT
COVER SURGICAL LIGHT HANDLE (MISCELLANEOUS) ×1 IMPLANT
DERMABOND ADVANCED .7 DNX12 (GAUZE/BANDAGES/DRESSINGS) ×1 IMPLANT
DRAPE IMP U-DRAPE 54X76 (DRAPES) ×1 IMPLANT
DRAPE SHEET LG 3/4 BI-LAMINATE (DRAPES) ×3 IMPLANT
DRAPE STERI IOBAN 125X83 (DRAPES) ×1 IMPLANT
DRAPE U-SHAPE 47X51 STRL (DRAPES) ×2 IMPLANT
DRESSING AQUACEL AG SP 3.5X10 (GAUZE/BANDAGES/DRESSINGS) IMPLANT
DRSG AQUACEL AG ADV 3.5X10 (GAUZE/BANDAGES/DRESSINGS) ×1 IMPLANT
ELECT BLADE TIP CTD 4 INCH (ELECTRODE) ×1 IMPLANT
EVACUATOR DRAINAGE 7X20 100CC (MISCELLANEOUS) IMPLANT
GAUZE 4X4 16PLY ~~LOC~~+RFID DBL (SPONGE) ×1 IMPLANT
GLOVE BIO SURGEON STRL SZ8.5 (GLOVE) ×2 IMPLANT
GLOVE BIOGEL M 7.0 STRL (GLOVE) ×1 IMPLANT
GLOVE BIOGEL PI IND STRL 7.5 (GLOVE) ×2 IMPLANT
GLOVE BIOGEL PI IND STRL 8 (GLOVE) ×1 IMPLANT
GLOVE BIOGEL PI IND STRL 8.5 (GLOVE) ×1 IMPLANT
GLOVE SURG LX STRL 7.5 STRW (GLOVE) ×2 IMPLANT
GOWN SPEC L3 XXLG W/TWL (GOWN DISPOSABLE) ×2 IMPLANT
GRAFT BNE CORT CANC CHIPS 30CC (Bone Implant) IMPLANT
HEAD FEM MOD CER 28X0 (Head) IMPLANT
HOLDER FOLEY CATH W/STRAP (MISCELLANEOUS) ×1 IMPLANT
HOOD PEEL AWAY T7 (MISCELLANEOUS) ×4 IMPLANT
KIT STIMULAN RAPID CURE 10CC (Orthopedic Implant) IMPLANT
KIT TURNOVER KIT A (KITS) IMPLANT
LINER ACETAB G7 44MM SZF (Liner) IMPLANT
NDL SPNL 18GX3.5 QUINCKE PK (NEEDLE) ×1 IMPLANT
NEEDLE SPNL 18GX3.5 QUINCKE PK (NEEDLE) ×1 IMPLANT
PACK ANTERIOR HIP CUSTOM (KITS) ×1 IMPLANT
PENCIL SMOKE EVACUATOR (MISCELLANEOUS) ×1 IMPLANT
RESTRICTOR ACETAB HIP TM 38 (Orthopedic Implant) IMPLANT
SAW OSC TIP CART 19.5X105X1.3 (SAW) ×1 IMPLANT
SCREW BONE 6.5X20 (Screw) IMPLANT
SCREW BONE 6.5X25 (Screw) IMPLANT
SCREW BONE SELF TAP 6.5X40 (Screw) IMPLANT
SCREW HIP 6.5X50 (Orthopedic Implant) IMPLANT
SEALER BIPOLAR AQUA 6.0 (INSTRUMENTS) ×1 IMPLANT
SHELL ACETAB MULTIHOLE 56MM S7 (Hips) IMPLANT
SOLUTION PRONTOSAN WOUND 350ML (IRRIGATION / IRRIGATOR) ×1 IMPLANT
STEM POROUS COATED 11X142 (Stem) IMPLANT
SUT ETHIBOND NAB CT1 #1 30IN (SUTURE) ×2 IMPLANT
SUT MNCRL AB 3-0 PS2 18 (SUTURE) ×1 IMPLANT
SUT MNCRL AB 4-0 PS2 18 (SUTURE) ×1 IMPLANT
SUT MON AB 2-0 CT1 36 (SUTURE) ×2 IMPLANT
SUT STRATAFIX 14 PDO 48 VLT (SUTURE) ×1 IMPLANT
SUT VIC AB 2-0 CT1 TAPERPNT 27 (SUTURE) ×2 IMPLANT
SYR 50ML LL SCALE MARK (SYRINGE) ×1 IMPLANT
TOWEL GREEN STERILE FF (TOWEL DISPOSABLE) ×1 IMPLANT
TRAY FOLEY MTR SLVR 16FR STAT (SET/KITS/TRAYS/PACK) ×1 IMPLANT
WATER STERILE IRR 1000ML POUR (IV SOLUTION) ×1 IMPLANT

## 2024-01-18 NOTE — Anesthesia Postprocedure Evaluation (Signed)
 Anesthesia Post Note  Patient: Melanie Li  Procedure(s) Performed: REVISION, TOTAL ARTHROPLASTY, HIP, ANTERIOR APPROACH (Right: Hip)     Patient location during evaluation: PACU Anesthesia Type: General Level of consciousness: awake Pain management: pain level controlled Vital Signs Assessment: post-procedure vital signs reviewed and stable Respiratory status: spontaneous breathing, nonlabored ventilation and respiratory function stable Cardiovascular status: blood pressure returned to baseline and stable Postop Assessment: no apparent nausea or vomiting Anesthetic complications: no   No notable events documented.  Last Vitals:  Vitals:   01/18/24 1711 01/18/24 1726  BP: 100/79 109/73  Pulse: 79 83  Resp: 14 20  Temp: 36.9 C 36.9 C  SpO2: 100% 100%    Last Pain:  Vitals:   01/18/24 1730  TempSrc:   PainSc: 7                  Scotti Kosta P Catalia Massett

## 2024-01-18 NOTE — Anesthesia Procedure Notes (Addendum)
 Procedure Name: Intubation Date/Time: 01/18/2024 7:47 AM  Performed by: Maryanna Smart, CRNAPre-anesthesia Checklist: Patient identified, Emergency Drugs available, Suction available, Patient being monitored and Timeout performed Patient Re-evaluated:Patient Re-evaluated prior to induction Oxygen Delivery Method: Circle system utilized Preoxygenation: Pre-oxygenation with 100% oxygen Induction Type: IV induction Ventilation: Mask ventilation without difficulty Laryngoscope Size: Miller and 2 Grade View: Grade I Tube type: Oral Tube size: 7.0 mm Number of attempts: 1 Airway Equipment and Method: Stylet Placement Confirmation: positive ETCO2, ETT inserted through vocal cords under direct vision, CO2 detector and breath sounds checked- equal and bilateral Secured at: 22 cm Tube secured with: Tape Dental Injury: Teeth and Oropharynx as per pre-operative assessment

## 2024-01-18 NOTE — Op Note (Signed)
 OPERATIVE REPORT  SURGEON: Adonica Hoose, MD   ASSISTANT: Trixie Furnace, PA-C.  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  PROCEDURE: Resection right hip articulating spacer. Conversion of previous surgery to right total hip arthroplasty.  EXPLANTS: OsteoRemedy modular stem, size small. OsteoRemedy modular femoral head, size 46 mm.  IMPLANTS: Biomet Taperloc Complete Reduced distal stem, size 11 x 142 mm, high offset. Biomet G7 [employer], size 56 mm, with 38 mm tantalum acetabular restrictor. 6.5 mm bone screw x 3. Biomet Dual Mobility liner, size 44 mm, F, neutral. Biomet Biolox ceramic head ball, size 28 + 0 mm, with 44 x 28 mm Vivacit-E Dual Mobility bearing. Cancellous bone chips 30 cc, x 2.  ANESTHESIA:  General  ESTIMATED BLOOD LOSS:-1400 mL    ANTIBIOTICS: 2g Ancef .  DRAINS: None.  COMPLICATIONS: None.   CONDITION: PACU - hemodynamically stable.   BRIEF CLINICAL NOTE: Melanie Li is a 58 y.o. 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  She was also found to have L3-4 discitis that was treated nonoperatively.  She completed 6 weeks of IV antibiotics.  After appropriate antibiotic holiday, repeat testing was performed.  On 12/11/2023, her sed rate was normal at 9 mm/h, and her C-reactive protein was normal at less than 3 mg/L.  On 12/13/2023, she underwent image guided right hip aspiration.  Synovasure analysis was performed of the right hip joint fluid, yielding 305 WBCs with 16.5% neutrophils.  Alpha defensin negative.  Microbial ID panel negative.  Culture final negative.  She  was then indicated for resection of her antibiotic spacer, and conversion to total hip arthroplasty.  The risk, benefits, and alternatives were discussed with the patient, and she elected to proceed.  PROCEDURE IN DETAIL: Surgical site was marked by myself in the pre-op holding area. Once inside the operating room, general anesthesia was obtained, and a foley catheter was inserted. The patient was then positioned on the Hana table.  All bony prominences were well padded.  The hip was prepped and draped in the normal sterile surgical fashion.  A time-out was called verifying side and site of surgery. The patient received IV antibiotics within 60 minutes of beginning the procedure.   Using a #10 blade, the previous bikini incision was excised.  Superficial dissection was performed lateral to the ASIS.  There was no superficial abscess or abnormal tissue.  The direct anterior approach to the hip was performed through the Hueter interval.  Lateral femoral circumflex vessels were treated with the Auqumantys. The anterior capsule was exposed and an inverted T capsulotomy was made.  Upon entering the joint, serosanguineous fluid was encountered.  There was extensive heterotopic ossification in the periarticular tissues, particularly superiorly.   I excised heterotopic bone from the rim of the acetabulum.  Using a bone hook, the spacer was gently dislocated.  The hip was then extended and externally rotated, and I was able to easily remove the spacer with a bone tamp.  Acetabular exposure was achieved.  Using a 47 mm reamer, I gently debrided soft tissue from the acetabulum.  A representative sample of the acetabular interface membrane tissue was sent for routine aerobic and anaerobic culture.  Under live fluoroscopic control, I reamed  up to a 55 mm reamer, achieving excellent engagement of the anterior and posterior acetabular walls.  I then placed a 38 millimeter tantalum acetabular restrictor into the medial  defect.  The acetabulum was copiously irrigated with normal saline.  I then inserted 60 cc of cancellous bone chips, which I impacted with a 54 mm reamer on reverse.  The acetabular restrictor was no longer visible after impacting the bone graft.  The real 56 mm cup was opened and impacted in approximately 42 degrees of abduction and 20 degrees of anteversion.  The cup achieved excellent press-fit fixation.  I placed a total of three 6.5 mm bone screws to further augment the fixation.  The acetabular component was then irrigated, and I impacted the real 44 mm dual mobility liner.   I then gained femoral exposure taking care to protect the abductors and greater trochanter.  This was performed using standard external rotation, extension, and adduction.  The proximal femoral bone was debrided with a curette.  I then sequentially broached up to an 11 mm Taperloc reduced distal broach.  Trial neck and dual mobility bearing were inserted. The hip was reduced.  Leg lengths and offset were checked fluoroscopically.  The hip was dislocated and trial components were removed.  The femoral canal was copiously irrigated with normal saline.  The real stem was inserted.  On the back table, the real dual mobility bearing was assembled.  The dual mobility bearing was impacted onto the clean, dry trunnion.  The hip was reduced.  Fluoroscopy was used to confirm component position and leg lengths.  At 90 degrees of external rotation and full extension, the hip was stable to an anterior directed force.  On the back table, 10 cc of Stimulan beads were mixed with 1.2 g of tobramycin , using the small mold.  Once the beads were fully hardened, I placed the beads into the hip joint and iliopsoas muscle/tendon sheath.   The wound was copiously irrigated with Prontosan solution and normal saline using pulse lavage.  The wound was closed in layers using #1 strata fix for the fascia, 2-0 Vicryl for the subcutaneous fat, 2-0 Monocryl for the  deep dermal layer, and staples plus Dermabond for the skin.  Once the glue was fully dried, an Aquacell Ag dressing was applied.  The patient was transported to the recovery room in stable condition.  Sponge, needle, and instrument counts were correct at the end of the case x2.  The patient tolerated the procedure well and there were no known complications.  Please note that a surgical assistant was a medical necessity for this procedure to perform it in a safe and expeditious manner. Assistant was necessary to provide appropriate retraction of vital neurovascular structures, to prevent femoral fracture, and to allow for anatomic placement of the prosthesis.

## 2024-01-18 NOTE — Discharge Instructions (Signed)
 Dr. Adonica Hoose Joint Replacement Specialist [facility] [address] [place], [place] [address] [phone]   TOTAL HIP REPLACEMENT POSTOPERATIVE DIRECTIONS    Hip Rehabilitation, Guidelines Following Surgery   WEIGHT BEARING Other:  Touchdown weightbearing on right lower extremity with walker.   The results of a hip operation are greatly improved after range of motion and muscle strengthening exercises. Follow all safety measures which are given to protect your hip. If any of these exercises cause increased pain or swelling in your joint, decrease the amount until you are comfortable again. Then slowly increase the exercises. Call your caregiver if you have problems or questions.   HOME CARE INSTRUCTIONS  Most of the following instructions are designed to prevent the dislocation of your new hip.  Remove items at home which could result in a fall. This includes throw rugs or furniture in walking pathways.  Continue medications as instructed at time of discharge. You may have some home medications which will be placed on hold until you complete the course of blood thinner medication. You may start showering once you are discharged home. Do not remove your dressing. Do not put on socks or shoes without following the instructions of your caregivers.   Sit on chairs with arms. Use the chair arms to help push yourself up when arising.  Arrange for the use of a toilet seat elevator so you are not sitting low.  Walk with walker as instructed.  You may resume a sexual relationship in one month or when given the OK by your caregiver.  Use walker as long as suggested by your caregivers.  You may put full weight on your legs and walk as much as is comfortable. Avoid periods of inactivity such as sitting longer than an hour when not asleep. This helps prevent blood clots.  You may return to work once you are cleared by Designer, industrial/product.  Do not drive a car for 6  weeks or until released by your surgeon.  Do not drive while taking narcotics.  Wear elastic stockings for two weeks following surgery during the day but you may remove then at night.  Make sure you keep all of your appointments after your operation with all of your doctors and caregivers. You should call the office at the above phone number and make an appointment for approximately two weeks after the date of your surgery. Please pick up a stool softener and laxative for home use as long as you are requiring pain medications. ICE to the affected hip every three hours for 30 minutes at a time and then as needed for pain and swelling. Continue to use ice on the hip for pain and swelling from surgery. You may notice swelling that will progress down to the foot and ankle.  This is normal after surgery.  Elevate the leg when you are not up walking on it.   It is important for you to complete the blood thinner medication as prescribed by your doctor. Continue to use the breathing machine which will help keep your temperature down.  It is common for your temperature to cycle up and down following surgery, especially at night when you are not up moving around and exerting yourself.  The breathing machine keeps your lungs expanded and your temperature down.  RANGE OF MOTION AND STRENGTHENING EXERCISES  These exercises are designed to help you keep full movement of your hip joint. Follow your caregiver's or physical therapist's instructions. Perform all exercises  about fifteen times, three times per day or as directed. Exercise both hips, even if you have had only one joint replacement. These exercises can be done on a training (exercise) mat, on the floor, on a table or on a bed. Use whatever works the best and is most comfortable for you. Use music or television while you are exercising so that the exercises are a pleasant break in your day. This will make your life better with the exercises acting as a break in  routine you can look forward to.  Lying on your back, slowly slide your foot toward your buttocks, raising your knee up off the floor. Then slowly slide your foot back down until your leg is straight again.  Lying on your back spread your legs as far apart as you can without causing discomfort.  Lying on your side, raise your upper leg and foot straight up from the floor as far as is comfortable. Slowly lower the leg and repeat.  Lying on your back, tighten up the muscle in the front of your thigh (quadriceps muscles). You can do this by keeping your leg straight and trying to raise your heel off the floor. This helps strengthen the largest muscle supporting your knee.  Lying on your back, tighten up the muscles of your buttocks both with the legs straight and with the knee bent at a comfortable angle while keeping your heel on the floor.   SKILLED REHAB INSTRUCTIONS: If the patient is transferred to a skilled rehab facility following release from the hospital, a list of the current medications will be sent to the facility for the patient to continue.  When discharged from the skilled rehab facility, please have the facility set up the patient's Home Health Physical Therapy prior to being released. Also, the skilled facility will be responsible for providing the patient with their medications at time of release from the facility to include their pain medication and their blood thinner medication. If the patient is still at the rehab facility at time of the two week follow up appointment, the skilled rehab facility will also need to assist the patient in arranging follow up appointment in our office and any transportation needs.  POST-OPERATIVE OPIOID TAPER INSTRUCTIONS: It is important to wean off of your opioid medication as soon as possible. If you do not need pain medication after your surgery it is ok to stop day one. Opioids include: Codeine, Hydrocodone(Norco, Vicodin), Oxycodone (Percocet,  oxycontin ) and hydromorphone  amongst others.  Long term and even short term use of opiods can cause: Increased pain response Dependence Constipation Depression Respiratory depression And more.  Withdrawal symptoms can include Flu like symptoms Nausea, vomiting And more Techniques to manage these symptoms Hydrate well Eat regular healthy meals Stay active Use relaxation techniques(deep breathing, meditating, yoga) Do Not substitute Alcohol  to help with tapering If you have been on opioids for less than two weeks and do not have pain than it is ok to stop all together.  Plan to wean off of opioids This plan should start within one week post op of your joint replacement. Maintain the same interval or time between taking each dose and first decrease the dose.  Cut the total daily intake of opioids by one tablet each day Next start to increase the time between doses. The last dose that should be eliminated is the evening dose.    MAKE SURE YOU:  Understand these instructions.  Will watch your condition.  Will get help right away  if you are not doing well or get worse.  Pick up stool softner and laxative for home use following surgery while on pain medications. Do not remove your dressing. The dressing is waterproof--it is OK to take showers. Continue to use ice for pain and swelling after surgery. Do not use any lotions or creams on the incision until instructed by your surgeon. Total Hip Protocol.

## 2024-01-18 NOTE — Plan of Care (Signed)
   Problem: Activity: Goal: Risk for activity intolerance will decrease Outcome: Progressing   Problem: Nutrition: Goal: Adequate nutrition will be maintained Outcome: Progressing   Problem: Pain Managment: Goal: General experience of comfort will improve and/or be controlled Outcome: Progressing   Problem: Safety: Goal: Ability to remain free from injury will improve Outcome: Progressing

## 2024-01-18 NOTE — Interval H&P Note (Signed)
 History and Physical Interval Note:  01/18/2024 7:15 AM  Melanie Li  has presented today for surgery, with the diagnosis of Right hip native spacer antiobiotic.  The various methods of treatment have been discussed with the patient and family. After consideration of risks, benefits and other options for treatment, the patient has consented to  Procedure(s): REVISION, TOTAL ARTHROPLASTY, HIP, ANTERIOR APPROACH (Right) as a surgical intervention.  The patient's history has been reviewed, patient examined, no change in status, stable for surgery.  I have reviewed the patient's chart and labs.  Questions were answered to the patient's satisfaction.    The risks, benefits, and alternatives were discussed with the patient. There are risks associated with the surgery including, but not limited to, problems with anesthesia (death), infection, instability (giving out of the joint), dislocation, differences in leg length/angulation/rotation, fracture of bones, loosening or failure of implants, hematoma (blood accumulation) which may require surgical drainage, blood clots, pulmonary embolism, nerve injury (foot drop and lateral thigh numbness), and blood vessel injury. The patient understands these risks and elects to proceed.    Margart Shears Raenell Mensing

## 2024-01-18 NOTE — Progress Notes (Signed)
 This RN present with assigned LPN when patient c/o being nauseated and hot all over. She vomited a small amount. Eyes briefly rolled back and hands tremored. Episode lasted approximately 45 seconds.   Blood that was infusing was paused. Charge nurse came to bedside, vital signs were taken and noted to be stable. No fever, no difficulty breathing. Blood transfusion resumed. After 45 second episode, patient immediately noted to be A/Ox4 and able to answer questions and direct her care.   Temperature of room lowered and other comfort measures done. Notified MD and Morphine  was discontinued as it was given just prior to this episode. Patient unable to remember if she has ever received Morphine  peri-operatively.  Ara Knee, RN 01/18/24 6:10 PM

## 2024-01-18 NOTE — Transfer of Care (Signed)
 Immediate Anesthesia Transfer of Care Note  Patient: Melanie Li  Procedure(s) Performed: REVISION, TOTAL ARTHROPLASTY, HIP, ANTERIOR APPROACH (Right: Hip)  Patient Location: PACU  Anesthesia Type:General  Level of Consciousness: awake and patient cooperative  Airway & Oxygen Therapy: Patient connected to face mask oxygen  Post-op Assessment: Report given to RN and Post -op Vital signs reviewed and stable  Post vital signs: stable  Last Vitals:  Vitals Value Taken Time  BP 109/78 01/18/24 1201  Temp    Pulse 82 01/18/24 1204  Resp 27 01/18/24 1204  SpO2 91 % 01/18/24 1204  Vitals shown include unfiled device data.  Last Pain:  Vitals:   01/18/24 0613  TempSrc: Oral  PainSc:          Complications: No notable events documented.

## 2024-01-19 LAB — BASIC METABOLIC PANEL WITH GFR
Anion gap: 6 (ref 5–15)
BUN: 14 mg/dL (ref 6–20)
CO2: 26 mmol/L (ref 22–32)
Calcium: 9.1 mg/dL (ref 8.9–10.3)
Chloride: 106 mmol/L (ref 98–111)
Creatinine, Ser: 0.87 mg/dL (ref 0.44–1.00)
GFR, Estimated: >60 mL/min (ref >60)
Glucose, Bld: 125 mg/dL — ABNORMAL HIGH (ref 70–99)
Potassium: 4.4 mmol/L (ref 3.5–5.1)
Sodium: 138 mmol/L (ref 135–145)

## 2024-01-19 LAB — CBC
HCT: 30.1 % — ABNORMAL LOW (ref 36.0–46.0)
Hemoglobin: 10.1 g/dL — ABNORMAL LOW (ref 12.0–15.0)
MCH: 30.7 pg (ref 26.0–34.0)
MCHC: 33.6 g/dL (ref 30.0–36.0)
MCV: 91.5 fL (ref 80.0–100.0)
Platelets: 197 10*3/uL (ref 150–400)
RBC: 3.29 MIL/uL — ABNORMAL LOW (ref 3.87–5.11)
RDW: 13.2 % (ref 11.5–15.5)
WBC: 9.5 10*3/uL (ref 4.0–10.5)
nRBC: 0 % (ref 0.0–0.2)

## 2024-01-19 MED ORDER — CEFAZOLIN SODIUM-DEXTROSE 2-4 GM/100ML-% IV SOLN
2.0000 g | Freq: Three times a day (TID) | INTRAVENOUS | Status: DC
  Administered 2024-01-19 – 2024-01-23 (×13): 2 g via INTRAVENOUS
  Filled 2024-01-19 (×13): qty 100

## 2024-01-19 NOTE — Plan of Care (Signed)
  Problem: Elimination: Goal: Will not experience complications related to urinary retention Outcome: Progressing   Problem: Safety: Goal: Ability to remain free from injury will improve Outcome: Progressing   Problem: Education: Goal: Knowledge of the prescribed therapeutic regimen will improve Outcome: Progressing   Problem: Clinical Measurements: Goal: Postoperative complications will be avoided or minimized Outcome: Progressing   Problem: Pain Management: Goal: Pain level will decrease with appropriate interventions Outcome: Progressing

## 2024-01-19 NOTE — Plan of Care (Signed)
  Problem: Activity: Goal: Risk for activity intolerance will decrease Outcome: Progressing   Problem: Elimination: Goal: Will not experience complications related to urinary retention Outcome: Progressing   Problem: Pain Managment: Goal: General experience of comfort will improve and/or be controlled Outcome: Progressing

## 2024-01-19 NOTE — Progress Notes (Signed)
    Subjective:  Patient reports pain as mild to moderate.  Denies N/V/CP/SOB/Abd pain. He reports that she feels a little loopy this morning. She denies dizziness currently. She denies tingling or numbness in LE bilaterally. We discussed course of care.  Intraoperative culture pending. PT at bedside for ambulation.   Objective:   VITALS:   Vitals:   01/18/24 1952 01/18/24 2044 01/19/24 0106 01/19/24 0505  BP: 128/76 118/81 116/71 126/74  Pulse: 71 84 81 73  Resp: 16 18 18 18   Temp: 98.9 F (37.2 C) 98 F (36.7 C) 97.7 F (36.5 C) 97.6 F (36.4 C)  TempSrc: Oral Oral Oral Oral  SpO2: 100% 100% 100% 100%  Weight:      Height:        NAD Neurologically intact ABD soft Neurovascular intact Sensation intact distally Intact pulses distally Dorsiflexion/Plantar flexion intact Incision: dressing C/D/I No cellulitis present Compartment soft   Lab Results  Component Value Date   WBC 9.5 01/19/2024   HGB 10.1 (L) 01/19/2024   HCT 30.1 (L) 01/19/2024   MCV 91.5 01/19/2024   PLT 197 01/19/2024   BMET    Component Value Date/Time   NA 138 01/19/2024 0320   NA 142 11/30/2021 0000   K 4.4 01/19/2024 0320   CL 106 01/19/2024 0320   CO2 26 01/19/2024 0320   GLUCOSE 125 (H) 01/19/2024 0320   BUN 14 01/19/2024 0320   BUN 13 11/30/2021 0000   CREATININE 0.87 01/19/2024 0320   CREATININE 0.67 12/11/2023 0933   CALCIUM 9.1 01/19/2024 0320   CALCIUM 9.5 11/13/2023 0000   EGFR 102.0 11/20/2023 0000   EGFR 80 11/30/2021 0000   GFRNONAA >60 01/19/2024 0320   Results for orders placed or performed during the hospital encounter of 01/18/24  Aerobic/Anaerobic Culture w Gram Stain (surgical/deep wound)     Status: None (Preliminary result)   Collection Time: 01/18/24  9:40 AM   Specimen: Path Tissue  Result Value Ref Range Status   Specimen Description   Final    TISSUE right hip tissue Performed at [facility], [address], [place], [place]  [address]    Special Requests   Final    NONE Performed at [facility], [address], [place], [place] [address]    Gram Stain   Final    RARE WBC PRESENT, PREDOMINANTLY PMN RARE GRAM POSITIVE COCCI IN CLUSTERS Performed at [facility], [address], [place], [place] [address]    Culture PENDING  Incomplete   Report Status PENDING  Incomplete     Assessment/Plan: 1 Day Post-Op   Principal Problem:   Staphylococcal arthritis of right hip (HCC) Active Problems:   S/P total right hip arthroplasty   TDWB with walker RLE.  DVT ppx: Aspirin , SCDs, TEDS PO pain control PT/OT: Has not ambulated with PT yet, they are at bedside.  Dispo:  - ABLA. Hemoglobin 10.1. She received 2 units PRBCs yesterday. Continue to monitor.  - Intraoperative culture right hip pending. Gram stain showed gram positive cocci in clusters. Culture no growth at less than 24 hours. Will continue to monitor. IV ancef  restarted, will tailor pending culture results.  - D/c pending culture results, medical stability and PT clearance.    Harman Lightning 01/19/2024, 8:38 AM   [facility]  [place] [address], [place], [place] [address] Phone: [phone] [email or web address] Facebook  [employer]

## 2024-01-19 NOTE — Progress Notes (Signed)
 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  Pt with hx of recent diskitis and vertebral osteomyelitis 3/25.    PT Comments  Pt continues motivated and progressing with mobility but fatigues easily and with reports of mild lightheadedness with gait.  This pm, pt up to ambulate increased distance in hall and up to [facility] for toileting before assisted back to bed.  BP supine 112/64; sit 110/71; stand 100/68; and after walking 115/71.  Pt expressing frustration over positive culture result.   If plan is discharge home, recommend the following: A little help with walking and/or transfers;A little help with bathing/dressing/bathroom;Assistance with cooking/housework;Assist for transportation;Help with stairs or ramp for entrance   Can travel by private vehicle        Equipment Recommendations  None recommended by PT    Recommendations for Other Services       Precautions / Restrictions Precautions Precautions: Fall Restrictions Weight Bearing Restrictions Per Provider Order: Yes LLE Weight Bearing Per Provider Order: Touchdown weight bearing     Mobility  Bed Mobility Overal bed mobility: Needs Assistance Bed Mobility: Sit to Supine     Supine to sit: Min assist Sit to supine: Min assist   General bed mobility comments: Increased time with assist to manage R LE    Transfers Overall transfer level: Needs assistance Equipment used: Rolling walker (2 wheels) Transfers: Sit to/from Stand, Bed to chair/wheelchair/BSC Sit to Stand: Min assist   Step pivot transfers: Min assist       General transfer comment: cues for LE management and use of UEs to self assist; physical assist to bring wt up and fwd and to balance in initial standing with RW; step pvt recliner  to BSC to EOB    Ambulation/Gait Ambulation/Gait assistance: Min assist, Contact guard assist Gait Distance (Feet): 34 Feet Assistive device: Rolling walker (2 wheels) Gait Pattern/deviations: Step-to pattern, Decreased step length - right, Decreased step length - left, Shuffle, Trunk flexed Gait velocity: decr     General Gait Details: Steady assist with min cues for postion from RW; pt wtih good awareness of and follow through with WB limitations   Stairs             Wheelchair Mobility     Tilt Bed    Modified Rankin (Stroke Patients Only)       Balance Overall balance assessment: Needs assistance Sitting-balance support: No upper extremity supported, Feet supported Sitting balance-Leahy Scale: Good     Standing balance support: Single extremity supported Standing balance-Leahy Scale: Poor                              Communication Communication Communication: No apparent difficulties  Cognition Arousal: Alert Behavior During Therapy: WFL for tasks assessed/performed   PT - Cognitive impairments: No apparent impairments                         Following commands: Intact      Cueing Cueing Techniques: Verbal cues  Exercises      General Comments        Pertinent Vitals/Pain Pain Assessment Pain Assessment: 0-10 Pain Score: 5  Pain Location: R hip Pain Descriptors / Indicators: Aching, Sore Pain Intervention(s):  Monitored during session, Limited activity within patient's tolerance, Premedicated before session, Ice applied    Home Living                          Prior Function            PT Goals (current goals can now be found in the care plan section) Acute Rehab PT Goals Patient Stated Goal: Regain IND PT Goal Formulation: With patient Time For Goal Achievement: 01/25/24 Potential to Achieve Goals: Good Progress towards PT goals: Progressing toward goals    Frequency    7X/week      PT  Plan      Co-evaluation              AM-PAC PT "6 Clicks" Mobility   Outcome Measure  Help needed turning from your back to your side while in a flat bed without using bedrails?: A Little Help needed moving from lying on your back to sitting on the side of a flat bed without using bedrails?: A Little Help needed moving to and from a bed to a chair (including a wheelchair)?: A Little Help needed standing up from a chair using your arms (e.g., wheelchair or bedside chair)?: A Little Help needed to walk in hospital room?: A Little Help needed climbing 3-5 steps with a railing? : A Lot 6 Click Score: 17    End of Session Equipment Utilized During Treatment: Gait belt Activity Tolerance: Patient tolerated treatment well Patient left: in bed;with call bell/phone within reach;with bed alarm set;with family/visitor present Nurse Communication: Mobility status PT Visit Diagnosis: Unsteadiness on feet (R26.81)     Time: 8295-6213 PT Time Calculation (min) (ACUTE ONLY): 30 min  Charges:    $Gait Training: 8-22 mins $Therapeutic Activity: 8-22 mins PT General Charges $$ ACUTE PT VISIT: 1 Visit                     Thedora Finlay PT Acute Rehabilitation Services Pager [phone] Office [phone]    Alaira Level 01/19/2024, 3:19 PM

## 2024-01-19 NOTE — TOC Transition Note (Signed)
 Transition of Care [facility]) - Discharge Note   Patient Details  Name: Melanie Li MRN: [id number] Date of Birth: 12/13/1965  [facility] CM/SW Contact:  Bari Leys, RN Phone Number: 01/19/2024, 9:50 AM   Clinical Narrative:   Met with patient at bedside to review dc therapy and home equipment needs, pt confirmed HEP, has RW, no home equipment needs. No TOC needs.     Final next level of care: Home/Self Care     Patient Goals and CMS Choice Patient states their goals for this hospitalization and ongoing recovery are:: return home          Discharge Placement                       Discharge Plan and Services Additional resources added to the After Visit Summary for                                       Social Drivers of Health (SDOH) Interventions SDOH Screenings   Food Insecurity: No Food Insecurity (01/18/2024)  Housing: Low Risk  (01/18/2024)  Transportation Needs: No Transportation Needs (01/18/2024)  Utilities: Not At Risk (01/18/2024)  Alcohol  Screen: Low Risk  (10/17/2023)  Depression (PHQ2-9): Low Risk  (12/11/2023)  Financial Resource Strain: Low Risk  (10/17/2023)  Physical Activity: Unknown (10/17/2023)  Social Connections: Moderately Isolated (01/18/2024)  Stress: No Stress Concern Present (10/17/2023)  Tobacco Use: Low Risk  (01/18/2024)     Readmission Risk Interventions    09/25/2023   10:27 AM  Readmission Risk Prevention Plan  Post Dischage Appt Complete  Medication Screening Complete  Transportation Screening Complete

## 2024-01-19 NOTE — Evaluation (Signed)
 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  Pt with hx of recent diskitis and vertebral osteomyelitis 3/25.  Clinical Impression  Pt admitted as above and presenting with functional mobility limitations 2* decreased R LE strength/ROM, post op pain, and TTWB.  Pt should progress to dc home with family assist.        If plan is discharge home, recommend the following: A little help with walking and/or transfers;A little help with bathing/dressing/bathroom;Assistance with cooking/housework;Assist for transportation;Help with stairs or ramp for entrance   Can travel by private vehicle        Equipment Recommendations None recommended by PT  Recommendations for Other Services       Functional Status Assessment Patient has had a recent decline in their functional status and demonstrates the ability to make significant improvements in function in a reasonable and predictable amount of time.     Precautions / Restrictions Precautions Precautions: Fall Restrictions Weight Bearing Restrictions Per Provider Order: Yes LLE Weight Bearing Per Provider Order: Touchdown weight bearing      Mobility  Bed Mobility Overal bed mobility: Needs Assistance Bed Mobility: Supine to Sit     Supine to sit: Min assist     General bed mobility comments: Increased time with assist to manage R LE    Transfers Overall transfer level: Needs assistance Equipment used: Rolling walker (2 wheels) Transfers: Sit to/from Stand Sit to Stand: Min assist           General transfer comment: cues for use of UEs to self assist    Ambulation/Gait Ambulation/Gait assistance: Min assist Gait Distance (Feet): 24 Feet Assistive device: Rolling walker (2 wheels) Gait  Pattern/deviations: Step-to pattern, Decreased step length - right, Decreased step length - left, Shuffle, Trunk flexed Gait velocity: decr     General Gait Details: Steady assist with min cues for postion from RW; pt wtih good awareness of and follow through with WB limitations  Stairs            Wheelchair Mobility     Tilt Bed    Modified Rankin (Stroke Patients Only)       Balance Overall balance assessment: Needs assistance Sitting-balance support: No upper extremity supported, Feet supported Sitting balance-Leahy Scale: Good     Standing balance support: Single extremity supported Standing balance-Leahy Scale: Poor                               Pertinent Vitals/Pain Pain Assessment Pain Assessment: 0-10 Pain Score: 5  Pain Location: R hip Pain Descriptors / Indicators: Aching, Sore Pain Intervention(s): Limited activity within patient's tolerance, Monitored during session, Premedicated before session, Ice applied    Home Living Family/patient expects to be discharged to:: Private residence Living Arrangements: Spouse/significant other Available Help at Discharge: Family Type of Home: House Home Access: Stairs to enter Entrance Stairs-Rails: None Entrance Stairs-Number of Steps: 1   Home Layout: Multi-level;Able to live on main level with bedroom/bathroom Home Equipment: Rolling Walker (2 wheels);Transport chair      Prior Function Prior Level of Function : Needs assist       Physical Assist : Mobility (physical);ADLs (physical) Mobility (physical): Stairs     ADLs Comments: IND prior to cortisone injection no  AD with all ADLs, self care tasks and IADLs     Extremity/Trunk Assessment        Lower Extremity Assessment Lower Extremity Assessment: Overall WFL for tasks assessed    Cervical / Trunk Assessment Cervical / Trunk Assessment: Normal  Communication   Communication Communication: No apparent difficulties     Cognition Arousal: Alert Behavior During Therapy: WFL for tasks assessed/performed   PT - Cognitive impairments: No apparent impairments                         Following commands: Intact       Cueing Cueing Techniques: Verbal cues     General Comments      Exercises     Assessment/Plan    PT Assessment Patient needs continued PT services  PT Problem List Decreased strength;Decreased range of motion;Decreased activity tolerance;Decreased balance;Decreased mobility;Decreased knowledge of use of DME;Pain       PT Treatment Interventions DME instruction;Gait training;Stair training;Functional mobility training;Therapeutic activities;Therapeutic exercise;Balance training;Patient/family education    PT Goals (Current goals can be found in the Care Plan section)  Acute Rehab PT Goals Patient Stated Goal: Regain IND PT Goal Formulation: With patient Time For Goal Achievement: 01/25/24 Potential to Achieve Goals: Good    Frequency 7X/week     Co-evaluation               AM-PAC PT "6 Clicks" Mobility  Outcome Measure Help needed turning from your back to your side while in a flat bed without using bedrails?: A Little Help needed moving from lying on your back to sitting on the side of a flat bed without using bedrails?: A Little Help needed moving to and from a bed to a chair (including a wheelchair)?: A Little Help needed standing up from a chair using your arms (e.g., wheelchair or bedside chair)?: A Little Help needed to walk in hospital room?: A Little Help needed climbing 3-5 steps with a railing? : A Lot 6 Click Score: 17    End of Session Equipment Utilized During Treatment: Gait belt Activity Tolerance: Patient tolerated treatment well Patient left: in chair;with call bell/phone within reach;with chair alarm set Nurse Communication: Mobility status PT Visit Diagnosis: Unsteadiness on feet (R26.81)    Time: 6295-2841 PT Time Calculation (min)  (ACUTE ONLY): 31 min   Charges:   PT Evaluation $PT Eval Low Complexity: 1 Low PT Treatments $Gait Training: 8-22 mins PT General Charges $$ ACUTE PT VISIT: 1 Visit         Thedora Finlay PT Acute Rehabilitation Services Pager [phone] Office [phone]   Montavious Wierzba 01/19/2024, 11:46 AM

## 2024-01-20 LAB — CBC
HCT: 22.9 % — ABNORMAL LOW (ref 36.0–46.0)
Hemoglobin: 7.8 g/dL — ABNORMAL LOW (ref 12.0–15.0)
MCH: 30.4 pg (ref 26.0–34.0)
MCHC: 34.1 g/dL (ref 30.0–36.0)
MCV: 89.1 fL (ref 80.0–100.0)
Platelets: 134 10*3/uL — ABNORMAL LOW (ref 150–400)
RBC: 2.57 MIL/uL — ABNORMAL LOW (ref 3.87–5.11)
RDW: 13.1 % (ref 11.5–15.5)
WBC: 7.1 10*3/uL (ref 4.0–10.5)
nRBC: 0 % (ref 0.0–0.2)

## 2024-01-20 LAB — BASIC METABOLIC PANEL WITH GFR
Anion gap: 6 (ref 5–15)
BUN: 12 mg/dL (ref 6–20)
CO2: 25 mmol/L (ref 22–32)
Calcium: 8.6 mg/dL — ABNORMAL LOW (ref 8.9–10.3)
Chloride: 107 mmol/L (ref 98–111)
Creatinine, Ser: 0.62 mg/dL (ref 0.44–1.00)
GFR, Estimated: >60 mL/min (ref >60)
Glucose, Bld: 105 mg/dL — ABNORMAL HIGH (ref 70–99)
Potassium: 3.5 mmol/L (ref 3.5–5.1)
Sodium: 138 mmol/L (ref 135–145)

## 2024-01-20 NOTE — Progress Notes (Signed)
 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  Pt with hx of recent discitis and vertebral osteomyelitis 3/25.    PT Comments  Pt ambulated again in hallway however distance remains limited this afternoon due to UE fatigue.  Pt does maintain R LE weight precautions well.  Pt anticipates d/c home in a couple days pending cultures.   If plan is discharge home, recommend the following: A little help with walking and/or transfers;A little help with bathing/dressing/bathroom;Assistance with cooking/housework;Assist for transportation;Help with stairs or ramp for entrance   Can travel by private vehicle        Equipment Recommendations  None recommended by PT    Recommendations for Other Services       Precautions / Restrictions Precautions Precautions: Fall Restrictions Weight Bearing Restrictions Per Provider Order: Yes LLE Weight Bearing Per Provider Order: Touchdown weight bearing     Mobility  Bed Mobility Overal bed mobility: Needs Assistance Bed Mobility: Supine to Sit, Sit to Supine     Supine to sit: Contact guard Sit to supine: Min assist   General bed mobility comments: verbal cues for use of gait belt to self assist Rt LE over EOB, assist for Rt LE onto bed due to pain and pt struggling with self assist    Transfers Overall transfer level: Needs assistance Equipment used: Rolling walker (2 wheels) Transfers: Sit to/from Stand Sit to Stand: Contact guard assist           General transfer comment: verbal cues for UE and LE positioning for pain control    Ambulation/Gait Ambulation/Gait assistance: Contact guard assist Gait Distance (Feet): 50 Feet Assistive device: Rolling walker (2 wheels) Gait Pattern/deviations: Step-to  pattern Gait velocity: decr     General Gait Details: pt prefers to bring Rt LE along with Lt LE maintaining NWB on R LE, distance limited by UE fatigue this afternoon; pt denies dizziness   Stairs             Wheelchair Mobility     Tilt Bed    Modified Rankin (Stroke Patients Only)       Balance                                            Communication Communication Communication: No apparent difficulties  Cognition Arousal: Alert Behavior During Therapy: WFL for tasks assessed/performed   PT - Cognitive impairments: No apparent impairments                         Following commands: Intact      Cueing Cueing Techniques: Verbal cues  Exercises     General Comments        Pertinent Vitals/Pain Pain Assessment Pain Assessment: 0-10 Pain Score: 6  Pain Location: R hip Pain Descriptors / Indicators: Aching, Sore Pain Intervention(s): Monitored during session, Repositioned, Ice applied    Home Living                          Prior Function            PT Goals (current goals can now be  found in the care plan section) Progress towards PT goals: Progressing toward goals    Frequency    7X/week      PT Plan      Co-evaluation              AM-PAC PT "6 Clicks" Mobility   Outcome Measure  Help needed turning from your back to your side while in a flat bed without using bedrails?: A Little Help needed moving from lying on your back to sitting on the side of a flat bed without using bedrails?: A Little Help needed moving to and from a bed to a chair (including a wheelchair)?: A Little Help needed standing up from a chair using your arms (e.g., wheelchair or bedside chair)?: A Little Help needed to walk in hospital room?: A Little Help needed climbing 3-5 steps with a railing? : A Little 6 Click Score: 18    End of Session Equipment Utilized During Treatment: Gait belt Activity Tolerance:  Patient tolerated treatment well Patient left: in bed;with call bell/phone within reach;with bed alarm set;with nursing/sitter in room Nurse Communication: Mobility status PT Visit Diagnosis: Other abnormalities of gait and mobility (R26.89)     Time: 1445-1455 PT Time Calculation (min) (ACUTE ONLY): 10 min  Charges:    $Gait Training: 8-22 mins PT General Charges $$ ACUTE PT VISIT: 1 Visit                     Blanch Bunde, DPT Physical Therapist Acute Rehabilitation Services Office: [phone]    [name] P[place] 01/20/2024, 4:10 PM

## 2024-01-20 NOTE — Progress Notes (Signed)
    Subjective: 2 Days Post-Op Procedure(s) (LRB): REVISION, TOTAL ARTHROPLASTY, HIP, ANTERIOR APPROACH (Right) Patient reports pain as 6 on 0-10 scale.   Denies CP or SOB.  Voiding without difficulty. Positive flatus. Objective: Vital signs in last 24 hours: Temp:  [98.3 F (36.8 C)-98.7 F (37.1 C)] 98.3 F (36.8 C) (05/31 0516) Pulse Rate:  [67-72] 69 (05/31 0516) Resp:  [15-16] 16 (05/31 0516) BP: (104-126)/(64-76) 104/67 (05/31 0516) SpO2:  [99 %-100 %] 99 % (05/31 0516)  Intake/Output from previous day: 05/30 0701 - 05/31 0700 In: 1632.7 [P.O.:900; I.V.:432.8; IV Piggyback:299.9] Out: 100 [Urine:100] Intake/Output this shift: No intake/output data recorded.  Labs: Recent Labs    01/18/24 1116 01/19/24 0320 01/20/24 0406  HGB 7.5* 10.1* 7.8*   Recent Labs    01/19/24 0320 01/20/24 0406  WBC 9.5 7.1  RBC 3.29* 2.57*  HCT 30.1* 22.9*  PLT 197 134*   Recent Labs    01/19/24 0320 01/20/24 0406  NA 138 138  K 4.4 3.5  CL 106 107  CO2 26 25  BUN 14 12  CREATININE 0.87 0.62  GLUCOSE 125* 105*  CALCIUM 9.1 8.6*   No results for input(s): "LABPT", "INR" in the last 72 hours.  Physical Exam: Neurologically intact Neurovascular intact Sensation intact distally Intact pulses distally Dorsiflexion/Plantar flexion intact Incision: dressing C/D/I No cellulitis present Compartment soft Body mass index is 21.59 kg/m.   Assessment/Plan: 2 Days Post-Op Procedure(s) (LRB): REVISION, TOTAL ARTHROPLASTY, HIP, ANTERIOR APPROACH (Right) -Continue care -Continue medical management  -Intraoperative culture pending  -Continue to mobilize with PT--PT states yesterday that she needs a little help with walking/transfers -Continue incentive spirometry  -TDWB with walker of the RLE -Continue Asprin, SCDs and TEDS for DVT prophylaxis  -Hgb was 7.8 today, trending down from 10.1 yesterday, was given 2 units PRBCs Thursday; she is not having any s/sx of  anemia -Continue abx -D/c pending culture results    [place] Due for Dr. Mort Ards [facility] [phone] 01/20/2024, 8:48 AM

## 2024-01-20 NOTE — Plan of Care (Signed)
  Problem: Elimination: Goal: Will not experience complications related to urinary retention Outcome: Progressing   Problem: Safety: Goal: Ability to remain free from injury will improve Outcome: Progressing   Problem: Activity: Goal: Ability to tolerate increased activity will improve Outcome: Progressing   Problem: Pain Management: Goal: Pain level will decrease with appropriate interventions Outcome: Progressing

## 2024-01-20 NOTE — Progress Notes (Signed)
 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  Pt with hx of recent discitis and vertebral osteomyelitis 3/25.    PT Comments  Pt ambulated in hallway and distance limited by dizziness however vitals obtained once in recliner were stable (see below).  Pt performed ankle pumps and quad sets in recliner and encouraged to use incentive spirometer.     If plan is discharge home, recommend the following: A little help with walking and/or transfers;A little help with bathing/dressing/bathroom;Assistance with cooking/housework;Assist for transportation;Help with stairs or ramp for entrance   Can travel by private vehicle        Equipment Recommendations  None recommended by PT    Recommendations for Other Services       Precautions / Restrictions Precautions Precautions: Fall Restrictions Weight Bearing Restrictions Per Provider Order: Yes LLE Weight Bearing Per Provider Order: Touchdown weight bearing     Mobility  Bed Mobility Overal bed mobility: Needs Assistance Bed Mobility: Supine to Sit     Supine to sit: Contact guard     General bed mobility comments: verbal cues for use of gait belt to self assist Rt LE over EOB    Transfers Overall transfer level: Needs assistance Equipment used: Rolling walker (2 wheels) Transfers: Sit to/from Stand Sit to Stand: Min assist           General transfer comment: verbal cues for UE and LE positioning for pain control, light assist with rise and steady    Ambulation/Gait Ambulation/Gait assistance: Contact guard assist Gait Distance (Feet): 50 Feet Assistive device: Rolling walker (2 wheels) Gait Pattern/deviations: Step-to pattern Gait velocity: decr     General Gait Details: pt prefers to bring Rt LE  along with Lt LE maintaining NWB on R LE, distance limited by dizziness so returned to room/recliner; vitals: 135/72 mmHg, 70 bpm, 100% room air   Stairs             Wheelchair Mobility     Tilt Bed    Modified Rankin (Stroke Patients Only)       Balance                                            Communication Communication Communication: No apparent difficulties  Cognition Arousal: Alert Behavior During Therapy: WFL for tasks assessed/performed   PT - Cognitive impairments: No apparent impairments                         Following commands: Intact      Cueing Cueing Techniques: Verbal cues  Exercises Total Joint Exercises Ankle Circles/Pumps: AROM, Both, 10 reps Quad Sets: AROM, Both, 10 reps    General Comments        Pertinent Vitals/Pain Pain Assessment Pain Assessment: 0-10 Pain Score: 5  Pain Location: R hip Pain Descriptors / Indicators: Aching, Sore Pain Intervention(s): Monitored during session, Repositioned, Ice applied    Home Living                          Prior Function            PT Goals (current  goals can now be found in the care plan section) Progress towards PT goals: Progressing toward goals    Frequency    7X/week      PT Plan      Co-evaluation              AM-PAC PT "6 Clicks" Mobility   Outcome Measure  Help needed turning from your back to your side while in a flat bed without using bedrails?: A Little Help needed moving from lying on your back to sitting on the side of a flat bed without using bedrails?: A Little Help needed moving to and from a bed to a chair (including a wheelchair)?: A Little Help needed standing up from a chair using your arms (e.g., wheelchair or bedside chair)?: A Little Help needed to walk in hospital room?: A Little Help needed climbing 3-5 steps with a railing? : A Little 6 Click Score: 18    End of Session Equipment Utilized During  Treatment: Gait belt Activity Tolerance: Patient tolerated treatment well Patient left: with call bell/phone within reach;in chair;with chair alarm set Nurse Communication: Mobility status PT Visit Diagnosis: Other abnormalities of gait and mobility (R26.89)     Time: 2130-8657 PT Time Calculation (min) (ACUTE ONLY): 13 min  Charges:    $Gait Training: 8-22 mins PT General Charges $$ ACUTE PT VISIT: 1 Visit                     Melanie Li, DPT Physical Therapist Acute Rehabilitation Services Office: [phone]    Melanie Li [place] 01/20/2024, 4:06 PM

## 2024-01-21 NOTE — Progress Notes (Signed)
 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  Pt with hx of recent discitis and vertebral osteomyelitis 3/25.    PT Comments  Pt ambulated in hallway this afternoon however distance limited by fatigue, right hip tightness and bothersome left wrist IV site placement.  Pt did mobilize very well this morning.  Pt reports just wanting to get back home but still awaiting cultures.     If plan is discharge home, recommend the following: A little help with walking and/or transfers;A little help with bathing/dressing/bathroom;Assistance with cooking/housework;Assist for transportation;Help with stairs or ramp for entrance   Can travel by private vehicle        Equipment Recommendations  None recommended by PT    Recommendations for Other Services       Precautions / Restrictions Precautions Precautions: Fall Restrictions Weight Bearing Restrictions Per Provider Order: Yes LLE Weight Bearing Per Provider Order: Touchdown weight bearing     Mobility  Bed Mobility Overal bed mobility: Needs Assistance Bed Mobility: Supine to Sit, Sit to Supine     Supine to sit: Contact guard Sit to supine: Min assist   General bed mobility comments: verbal cues for use of gait belt to self assist Rt LE over EOB; assist for Rt LE onto bed due to pain and pt struggling with self assist    Transfers Overall transfer level: Needs assistance Equipment used: Rolling walker (2 wheels) Transfers: Sit to/from Stand Sit to Stand: Contact guard assist           General transfer comment: verbal cues for UE and LE positioning for pain control    Ambulation/Gait Ambulation/Gait assistance: Contact guard assist, Min assist Gait Distance (Feet): 60 Feet Assistive device:  Rolling walker (2 wheels) Gait Pattern/deviations: Step-to pattern       General Gait Details: pt able to maintain NWB on R LE, distance to tolerance; light assist for stability with turning around and pt denies dizziness but reports increased fatigue and Rt hip feeling tight, also had new IV placed in left wrist today which was bothersome with increased UE WBing   Stairs Stairs: Yes Stairs assistance: Contact guard assist Stair Management: Step to pattern, Backwards, With walker Number of Stairs: 1 General stair comments: verbal cues for safety and sequence   Wheelchair Mobility     Tilt Bed    Modified Rankin (Stroke Patients Only)       Balance                                            Communication Communication Communication: No apparent difficulties  Cognition Arousal: Alert Behavior During Therapy: WFL for tasks assessed/performed   PT - Cognitive impairments: No apparent impairments                         Following commands: Intact      Cueing Cueing Techniques: Verbal cues  Exercises   General Comments        Pertinent Vitals/Pain Pain Assessment Pain Assessment: 0-10 Pain Score: 7  Pain Location: R hip Pain Descriptors / Indicators: Aching, Sore, Tightness Pain Intervention(s): Repositioned, Monitored during session, Patient requesting pain  meds-RN notified, Ice applied (requested muscle relaxer, RN notified)    Home Living                          Prior Function            PT Goals (current goals can now be found in the care plan section) Progress towards PT goals: Progressing toward goals    Frequency    7X/week      PT Plan      Co-evaluation              AM-PAC PT "6 Clicks" Mobility   Outcome Measure  Help needed turning from your back to your side while in a flat bed without using bedrails?: A Little Help needed moving from lying on your back to sitting on the side of a flat  bed without using bedrails?: A Little Help needed moving to and from a bed to a chair (including a wheelchair)?: A Little Help needed standing up from a chair using your arms (e.g., wheelchair or bedside chair)?: A Little Help needed to walk in hospital room?: A Little Help needed climbing 3-5 steps with a railing? : A Little 6 Click Score: 18    End of Session Equipment Utilized During Treatment: Gait belt Activity Tolerance: Patient tolerated treatment well Patient left: with call bell/phone within reach;in bed;with family/visitor present;with bed alarm set Nurse Communication: Mobility status;Patient requests pain meds PT Visit Diagnosis: Other abnormalities of gait and mobility (R26.89)     Time: 1458-1510 PT Time Calculation (min) (ACUTE ONLY): 12 min  Charges:    $Gait Training: 8-22 mins PT General Charges $$ ACUTE PT VISIT: 1 Visit                    Blanch Bunde, DPT Physical Therapist Acute Rehabilitation Services Office: [phone]  [name] Payson 01/21/2024, 3:40 PM

## 2024-01-21 NOTE — Plan of Care (Signed)
  Problem: Education: Goal: Knowledge of General Education information will improve Description: Including pain rating scale, medication(s)/side effects and non-pharmacologic comfort measures Outcome: Adequate for Discharge   Problem: Health Behavior/Discharge Planning: Goal: Ability to manage health-related needs will improve Outcome: Adequate for Discharge   Problem: Clinical Measurements: Goal: Ability to maintain clinical measurements within normal limits will improve Outcome: Progressing Goal: Will remain free from infection Outcome: Progressing Goal: Diagnostic test results will improve Outcome: Progressing Goal: Respiratory complications will improve Outcome: Progressing Goal: Cardiovascular complication will be avoided Outcome: Progressing   Problem: Activity: Goal: Risk for activity intolerance will decrease Outcome: Adequate for Discharge   Problem: Nutrition: Goal: Adequate nutrition will be maintained Outcome: Adequate for Discharge   Problem: Coping: Goal: Level of anxiety will decrease Outcome: Progressing   Problem: Elimination: Goal: Will not experience complications related to bowel motility Outcome: Progressing Goal: Will not experience complications related to urinary retention Outcome: Completed/Met   Problem: Pain Managment: Goal: General experience of comfort will improve and/or be controlled Outcome: Progressing   Problem: Safety: Goal: Ability to remain free from injury will improve Outcome: Progressing   Problem: Skin Integrity: Goal: Risk for impaired skin integrity will decrease Outcome: Adequate for Discharge   Problem: Education: Goal: Knowledge of the prescribed therapeutic regimen will improve Outcome: Progressing Goal: Understanding of discharge needs will improve Outcome: Progressing Goal: Individualized Educational Video(s) Outcome: Completed/Met   Problem: Activity: Goal: Ability to avoid complications of mobility  impairment will improve Outcome: Adequate for Discharge Goal: Ability to tolerate increased activity will improve Outcome: Adequate for Discharge   Problem: Clinical Measurements: Goal: Postoperative complications will be avoided or minimized Outcome: Progressing   Problem: Pain Management: Goal: Pain level will decrease with appropriate interventions Outcome: Adequate for Discharge   Problem: Skin Integrity: Goal: Will show signs of wound healing Outcome: Progressing

## 2024-01-21 NOTE — Progress Notes (Signed)
 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  Pt with hx of recent discitis and vertebral osteomyelitis 3/25.    PT Comments  Pt ambulated in hallway, practice one step and performed LE exercises.  Pt provided with HEP handout.    If plan is discharge home, recommend the following: A little help with walking and/or transfers;A little help with bathing/dressing/bathroom;Assistance with cooking/housework;Assist for transportation;Help with stairs or ramp for entrance   Can travel by private vehicle        Equipment Recommendations  None recommended by PT    Recommendations for Other Services       Precautions / Restrictions Precautions Precautions: Fall Restrictions Weight Bearing Restrictions Per Provider Order: Yes LLE Weight Bearing Per Provider Order: Touchdown weight bearing     Mobility  Bed Mobility Overal bed mobility: Needs Assistance Bed Mobility: Supine to Sit     Supine to sit: Contact guard     General bed mobility comments: verbal cues for use of gait belt to self assist Rt LE over EOB    Transfers Overall transfer level: Needs assistance Equipment used: Rolling walker (2 wheels) Transfers: Sit to/from Stand Sit to Stand: Contact guard assist           General transfer comment: verbal cues for UE and LE positioning for pain control    Ambulation/Gait Ambulation/Gait assistance: Contact guard assist Gait Distance (Feet): 100 Feet Assistive device: Rolling walker (2 wheels) Gait Pattern/deviations: Step-to pattern       General Gait Details: pt able to maintain NWB on R LE, distance to tolerance   Stairs Stairs: Yes Stairs assistance: Contact guard assist Stair Management: Step to pattern, Backwards, With  walker Number of Stairs: 1 General stair comments: verbal cues for safety and sequence   Wheelchair Mobility     Tilt Bed    Modified Rankin (Stroke Patients Only)       Balance                                            Communication Communication Communication: No apparent difficulties  Cognition Arousal: Alert Behavior During Therapy: WFL for tasks assessed/performed   PT - Cognitive impairments: No apparent impairments                         Following commands: Intact      Cueing Cueing Techniques: Verbal cues  Exercises Total Joint Exercises Ankle Circles/Pumps: AROM, Both, 10 reps Quad Sets: AROM, Both, 10 reps Short Arc Quad: AROM, Right, 10 reps Heel Slides: AAROM, Right, 10 reps Hip ABduction/ADduction: AAROM, Right, 10 reps Long Arc Quad: AROM, Right, 10 reps    General Comments        Pertinent Vitals/Pain Pain Assessment Pain Assessment: 0-10 Pain Score: 5  Pain Location: R hip Pain Descriptors / Indicators: Aching, Sore Pain Intervention(s): Repositioned, Ice applied, Monitored during session    Home Living                          Prior Function            PT Goals (  current goals can now be found in the care plan section) Progress towards PT goals: Progressing toward goals    Frequency    7X/week      PT Plan      Co-evaluation              AM-PAC PT "6 Clicks" Mobility   Outcome Measure  Help needed turning from your back to your side while in a flat bed without using bedrails?: A Little Help needed moving from lying on your back to sitting on the side of a flat bed without using bedrails?: A Little Help needed moving to and from a bed to a chair (including a wheelchair)?: A Little Help needed standing up from a chair using your arms (e.g., wheelchair or bedside chair)?: A Little Help needed to walk in hospital room?: A Little Help needed climbing 3-5 steps with a railing? :  A Little 6 Click Score: 18    End of Session Equipment Utilized During Treatment: Gait belt Activity Tolerance: Patient tolerated treatment well Patient left: in chair;with call bell/phone within reach;with chair alarm set Nurse Communication: Mobility status PT Visit Diagnosis: Other abnormalities of gait and mobility (R26.89)     Time: 1610-9604 PT Time Calculation (min) (ACUTE ONLY): 18 min  Charges:    $Gait Training: 8-22 mins PT General Charges $$ ACUTE PT VISIT: 1 Visit                     Blanch Bunde, DPT Physical Therapist Acute Rehabilitation Services Office: [phone]    [name] Payson 01/21/2024, 3:34 PM

## 2024-01-21 NOTE — Progress Notes (Signed)
   Subjective: 3 Days Post-Op Procedure(s) (LRB): REVISION, TOTAL ARTHROPLASTY, HIP, ANTERIOR APPROACH (Right)  Pt sitting up comfortably in no acute distress Awaiting final cultures prior to discharge Initial gram stain showed staph Therapy going well Denies any new symptoms or issues Patient reports pain as mild.  Objective:   VITALS:   Vitals:   01/20/24 2157 01/21/24 0523  BP: 116/78 119/73  Pulse: 77 72  Resp: 16 17  Temp: 98.7 F (37.1 C) 98.3 F (36.8 C)  SpO2: 100% 100%    Right hip: incision healing well Nv intact distally No rashes or edema Gentle rom without pain  LABS Recent Labs    01/18/24 1116 01/19/24 0320 01/20/24 0406  HGB 7.5* 10.1* 7.8*  HCT 22.0* 30.1* 22.9*  WBC  --  9.5 7.1  PLT  --  197 134*    Recent Labs    01/18/24 1116 01/19/24 0320 01/20/24 0406  NA 138 138 138  K 4.0 4.4 3.5  BUN  --  14 12  CREATININE  --  0.87 0.62  GLUCOSE  --  125* 105*     Assessment/Plan: 3 Days Post-Op Procedure(s) (LRB): REVISION, TOTAL ARTHROPLASTY, HIP, ANTERIOR APPROACH (Right) Still awaiting cultures prior to discharge Continue PT/OT Pain management as needed Will continue to monitor her progress    Lorina Roosevelt PA-C, MPAS [facility] is now [facility] [address], [place], [place] [address] Phone: [phone] [email or web address] Facebook  [employer]

## 2024-01-21 NOTE — Plan of Care (Signed)
  Problem: Education: Goal: Knowledge of General Education information will improve Description: Including pain rating scale, medication(s)/side effects and non-pharmacologic comfort measures Outcome: Adequate for Discharge   Problem: Health Behavior/Discharge Planning: Goal: Ability to manage health-related needs will improve Outcome: Adequate for Discharge   Problem: Clinical Measurements: Goal: Ability to maintain clinical measurements within normal limits will improve Outcome: Progressing Goal: Will remain free from infection Outcome: Progressing Goal: Diagnostic test results will improve Outcome: Progressing Goal: Respiratory complications will improve Outcome: Progressing Goal: Cardiovascular complication will be avoided Outcome: Progressing   Problem: Activity: Goal: Risk for activity intolerance will decrease Outcome: Adequate for Discharge   Problem: Nutrition: Goal: Adequate nutrition will be maintained Outcome: Completed/Met   Problem: Coping: Goal: Level of anxiety will decrease Outcome: Progressing   Problem: Elimination: Goal: Will not experience complications related to bowel motility Outcome: Completed/Met   Problem: Pain Managment: Goal: General experience of comfort will improve and/or be controlled Outcome: Progressing   Problem: Safety: Goal: Ability to remain free from injury will improve Outcome: Adequate for Discharge   Problem: Skin Integrity: Goal: Risk for impaired skin integrity will decrease Outcome: Adequate for Discharge   Problem: Education: Goal: Knowledge of the prescribed therapeutic regimen will improve Outcome: Adequate for Discharge Goal: Understanding of discharge needs will improve Outcome: Progressing   Problem: Activity: Goal: Ability to avoid complications of mobility impairment will improve Outcome: Adequate for Discharge Goal: Ability to tolerate increased activity will improve Outcome: Adequate for Discharge    Problem: Clinical Measurements: Goal: Postoperative complications will be avoided or minimized Outcome: Progressing   Problem: Pain Management: Goal: Pain level will decrease with appropriate interventions Outcome: Adequate for Discharge   Problem: Skin Integrity: Goal: Will show signs of wound healing Outcome: Progressing

## 2024-01-22 ENCOUNTER — Other Ambulatory Visit: Payer: Self-pay

## 2024-01-22 DIAGNOSIS — M00051 Staphylococcal arthritis, right hip: Secondary | ICD-10-CM

## 2024-01-22 DIAGNOSIS — T8451XA Infection and inflammatory reaction due to internal right hip prosthesis, initial encounter: Secondary | ICD-10-CM

## 2024-01-22 DIAGNOSIS — D62 Acute posthemorrhagic anemia: Secondary | ICD-10-CM | POA: Diagnosis not present

## 2024-01-22 DIAGNOSIS — M86151 Other acute osteomyelitis, right femur: Secondary | ICD-10-CM | POA: Diagnosis not present

## 2024-01-22 DIAGNOSIS — Z79890 Hormone replacement therapy (postmenopausal): Secondary | ICD-10-CM | POA: Diagnosis not present

## 2024-01-22 DIAGNOSIS — Z888 Allergy status to other drugs, medicaments and biological substances status: Secondary | ICD-10-CM | POA: Diagnosis not present

## 2024-01-22 DIAGNOSIS — M1611 Unilateral primary osteoarthritis, right hip: Secondary | ICD-10-CM | POA: Diagnosis not present

## 2024-01-22 DIAGNOSIS — Z885 Allergy status to narcotic agent status: Secondary | ICD-10-CM | POA: Diagnosis not present

## 2024-01-22 DIAGNOSIS — Z8249 Family history of ischemic heart disease and other diseases of the circulatory system: Secondary | ICD-10-CM | POA: Diagnosis not present

## 2024-01-22 DIAGNOSIS — R519 Headache, unspecified: Secondary | ICD-10-CM | POA: Diagnosis not present

## 2024-01-22 DIAGNOSIS — Z825 Family history of asthma and other chronic lower respiratory diseases: Secondary | ICD-10-CM | POA: Diagnosis not present

## 2024-01-22 DIAGNOSIS — Z882 Allergy status to sulfonamides status: Secondary | ICD-10-CM | POA: Diagnosis not present

## 2024-01-22 DIAGNOSIS — Z833 Family history of diabetes mellitus: Secondary | ICD-10-CM | POA: Diagnosis not present

## 2024-01-22 DIAGNOSIS — M4646 Discitis, unspecified, lumbar region: Secondary | ICD-10-CM | POA: Diagnosis not present

## 2024-01-22 DIAGNOSIS — E039 Hypothyroidism, unspecified: Secondary | ICD-10-CM | POA: Diagnosis not present

## 2024-01-22 DIAGNOSIS — Z823 Family history of stroke: Secondary | ICD-10-CM | POA: Diagnosis not present

## 2024-01-22 LAB — BPAM RBC
Blood Product Expiration Date: 202506292359
Blood Product Expiration Date: 202506292359
Blood Product Expiration Date: 202506292359
Blood Product Expiration Date: 202506292359
ISSUE DATE / TIME: 202505291156
ISSUE DATE / TIME: 202505291704
Unit Type and Rh: 5100
Unit Type and Rh: 5100
Unit Type and Rh: 5100
Unit Type and Rh: 5100

## 2024-01-22 LAB — TYPE AND SCREEN
ABO/RH(D): O POS
Antibody Screen: NEGATIVE
Unit division: 0
Unit division: 0
Unit division: 0
Unit division: 0

## 2024-01-22 MED ORDER — CEFAZOLIN IV (FOR PTA / DISCHARGE USE ONLY): 2.0000 g | Freq: Three times a day (TID) | INTRAVENOUS | 0 refills | Status: AC

## 2024-01-22 NOTE — Progress Notes (Signed)
 PHARMACY CONSULT NOTE FOR:  OUTPATIENT  PARENTERAL ANTIBIOTIC THERAPY (OPAT)  Indication: Right hip septic arthritis  Regimen: Cefazolin  2 gm IV q 8 hours  End date: 02/29/24   IV antibiotic discharge orders are pended. To discharging provider:  please sign these orders via discharge navigator,  Select New Orders & click on the button choice - Manage This Unsigned Work.     Thank you for allowing pharmacy to be a part of this patient's care.  Denson Flake, PharmD, BCPS, BCIDP Infectious Diseases Clinical Pharmacist Phone: [phone] 01/22/2024, 4:09 PM

## 2024-01-22 NOTE — Progress Notes (Signed)
   Subjective: 4 Days Post-Op Procedure(s) (LRB): REVISION, TOTAL ARTHROPLASTY, HIP, ANTERIOR APPROACH (Right)  Pt sitting up comfortably in no acute distress Awaiting final cultures prior to discharge Initial gram stain showed GPC Therapy going well Denies any new symptoms or issues Patient reports pain as mild.  Objective:   VITALS:   Vitals:   01/22/24 0550 01/22/24 1315  BP: 108/70 116/77  Pulse: 73 86  Resp: 16 16  Temp: 98.2 F (36.8 C) 98.1 F (36.7 C)  SpO2: 98% 100%    Right hip: incision healing well Nv intact distally No rashes or edema Gentle rom without pain  Recent Results (from the past 240 hours)  Aerobic/Anaerobic Culture w Gram Stain (surgical/deep wound)     Status: None (Preliminary result)   Collection Time: 01/18/24  9:40 AM   Specimen: Path Tissue  Result Value Ref Range Status   Specimen Description   Final    TISSUE right hip tissue Performed at [facility], [address], [place], [place] [address]    Special Requests   Final    NONE Performed at [facility], [address] [place], [place] [address]    Gram Stain   Final    RARE WBC PRESENT, PREDOMINANTLY PMN RARE GRAM POSITIVE COCCI IN CLUSTERS    Culture   Final    NO GROWTH 4 DAYS NO ANAEROBES ISOLATED; CULTURE IN PROGRESS FOR 5 DAYS Performed at [facility], [address] [place], [place] [address]    Report Status PENDING  Incomplete     LABS Recent Labs    01/20/24 0406  HGB 7.8*  HCT 22.9*  WBC 7.1  PLT 134*    Recent Labs    01/20/24 0406  NA 138  K 3.5  BUN 12  CREATININE 0.62  GLUCOSE 105*     Assessment/Plan: 4 Days Post-Op Procedure(s) (LRB): REVISION, TOTAL ARTHROPLASTY, HIP, ANTERIOR APPROACH (Right) Intra-op cx NGTD  Discussed intra-op gram stain with Dr. Zelda Hickman ID recommends PICC and 6 weeks IV abx Continue PT/OT Pain management as needed Will continue to monitor her progress, anticipate d/c  home tomorrow    Adonica Hoose, MD [facility] is now [facility]  [place] [address], [address], [place], [place] [address] Phone: [phone] [email or web address] Facebook  [employer]

## 2024-01-22 NOTE — Plan of Care (Signed)

## 2024-01-22 NOTE — Telephone Encounter (Signed)
 Incorrect code was used.    Patient informed to disregard bill.

## 2024-01-22 NOTE — Progress Notes (Signed)
 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  Pt with hx of recent discitis and vertebral osteomyelitis 3/25.    PT Comments  Pt is progressing well; overall supervision level for functional mobility, amb ~ 100'. Pt expresses frustration regarding hospital LOS and not knowing when she will be able to d/c home. RN is aware and reports she sent message to PA.  Will check on pt once more but could likely d/c PT in acute setting.    If plan is discharge home, recommend the following: A little help with walking and/or transfers;A little help with bathing/dressing/bathroom;Assistance with cooking/housework;Assist for transportation;Help with stairs or ramp for entrance   Can travel by private vehicle        Equipment Recommendations  None recommended by PT    Recommendations for Other Services       Precautions / Restrictions Precautions Precautions: Fall Recall of Precautions/Restrictions: Intact Restrictions RLE Weight Bearing Per Provider Order: Touchdown weight bearing LLE Weight Bearing Per Provider Order: Weight bearing as tolerated Other Position/Activity Restrictions: pt typically performs NWB RLE     Mobility  Bed Mobility Overal bed mobility: Needs Assistance Bed Mobility: Supine to Sit     Supine to sit: Supervision     General bed mobility comments: verbal cues for use of gait belt to self assist Rt LE over EOB;    Transfers   Equipment used: Rolling walker (2 wheels) Transfers: Sit to/from Stand Sit to Stand: Supervision           General transfer comment: verbal cues for UE and LE positioning for pain control    Ambulation/Gait Ambulation/Gait assistance: Supervision Gait Distance (Feet): 100 Feet Assistive device:  Rolling walker (2 wheels)   Gait velocity: decr     General Gait Details: pt able to maintain NWB on R LE, distance to tolerance;  IV placed in left wrist today which was bothersome with increased UE WBing   Stairs             Wheelchair Mobility     Tilt Bed    Modified Rankin (Stroke Patients Only)       Balance   Sitting-balance support: No upper extremity supported, Feet supported Sitting balance-Leahy Scale: Good     Standing balance support: Single extremity supported, Reliant on assistive device for balance, During functional activity, No upper extremity supported Standing balance-Leahy Scale: Fair                              Hotel manager: No apparent difficulties  Cognition Arousal: Alert Behavior During Therapy: WFL for tasks assessed/performed   PT - Cognitive impairments: No apparent impairments                         Following commands: Intact      Cueing Cueing Techniques: Verbal cues  Exercises Total Joint Exercises Quad Sets:  (pt reports doing exercises on her own)    General Comments        Pertinent Vitals/Pain Pain Assessment Pain Assessment: 0-10 Pain Score: 6  Pain Location: R hip Pain Descriptors / Indicators: Aching, Sore, Tightness Pain Intervention(s): Limited activity within patient's tolerance, Monitored during session, Premedicated before session,  Repositioned, Ice applied    Home Living                          Prior Function            PT Goals (current goals can now be found in the care plan section) Acute Rehab PT Goals Patient Stated Goal: Regain IND, spend time with grandchildren (one on the way) PT Goal Formulation: With patient Time For Goal Achievement: 01/25/24 Potential to Achieve Goals: Good Progress towards PT goals: Progressing toward goals    Frequency    7X/week      PT Plan      Co-evaluation              AM-PAC  PT "6 Clicks" Mobility   Outcome Measure  Help needed turning from your back to your side while in a flat bed without using bedrails?: A Little Help needed moving from lying on your back to sitting on the side of a flat bed without using bedrails?: A Little Help needed moving to and from a bed to a chair (including a wheelchair)?: None Help needed standing up from a chair using your arms (e.g., wheelchair or bedside chair)?: None Help needed to walk in hospital room?: A Little Help needed climbing 3-5 steps with a railing? : A Little 6 Click Score: 20    End of Session Equipment Utilized During Treatment: Gait belt Activity Tolerance: Patient tolerated treatment well Patient left: in chair;with call bell/phone within reach;with family/visitor present   PT Visit Diagnosis: Other abnormalities of gait and mobility (R26.89)     Time: 1610-9604 PT Time Calculation (min) (ACUTE ONLY): 16 min  Charges:    $Gait Training: 8-22 mins PT General Charges $$ ACUTE PT VISIT: 1 Visit                     Daivik Overley, PT  Acute Rehab Dept [facility]) [phone]  01/22/2024    [facility] 01/22/2024, 11:35 AM

## 2024-01-23 DIAGNOSIS — M00051 Staphylococcal arthritis, right hip: Secondary | ICD-10-CM | POA: Diagnosis present

## 2024-01-23 DIAGNOSIS — M86151 Other acute osteomyelitis, right femur: Secondary | ICD-10-CM | POA: Diagnosis not present

## 2024-01-23 LAB — BASIC METABOLIC PANEL WITH GFR
Anion gap: 8 (ref 5–15)
BUN: 14 mg/dL (ref 6–20)
CO2: 27 mmol/L (ref 22–32)
Calcium: 8.9 mg/dL (ref 8.9–10.3)
Chloride: 106 mmol/L (ref 98–111)
Creatinine, Ser: 0.75 mg/dL (ref 0.44–1.00)
GFR, Estimated: >60 mL/min (ref >60)
Glucose, Bld: 113 mg/dL — ABNORMAL HIGH (ref 70–99)
Potassium: 3.7 mmol/L (ref 3.5–5.1)
Sodium: 141 mmol/L (ref 135–145)

## 2024-01-23 LAB — AEROBIC/ANAEROBIC CULTURE W GRAM STAIN (SURGICAL/DEEP WOUND): Culture: NO GROWTH

## 2024-01-23 LAB — CBC
HCT: 25.8 % — ABNORMAL LOW (ref 36.0–46.0)
Hemoglobin: 8.5 g/dL — ABNORMAL LOW (ref 12.0–15.0)
MCH: 30.2 pg (ref 26.0–34.0)
MCHC: 32.9 g/dL (ref 30.0–36.0)
MCV: 91.8 fL (ref 80.0–100.0)
Platelets: 216 10*3/uL (ref 150–400)
RBC: 2.81 MIL/uL — ABNORMAL LOW (ref 3.87–5.11)
RDW: 13.3 % (ref 11.5–15.5)
WBC: 6.1 10*3/uL (ref 4.0–10.5)
nRBC: 0 % (ref 0.0–0.2)

## 2024-01-23 MED ORDER — HYDROCODONE-ACETAMINOPHEN 5-325 MG PO TABS: 1.0000 | ORAL_TABLET | ORAL | 0 refills | Status: AC | PRN

## 2024-01-23 MED ORDER — ASPIRIN 81 MG PO CHEW: 81.0000 mg | CHEWABLE_TABLET | Freq: Two times a day (BID) | ORAL | 0 refills | Status: AC

## 2024-01-23 MED ORDER — INDOMETHACIN 50 MG PO CAPS: 50.0000 mg | ORAL_CAPSULE | Freq: Two times a day (BID) | ORAL | 0 refills | Status: AC

## 2024-01-23 MED ORDER — METHOCARBAMOL 500 MG PO TABS
500.0000 mg | ORAL_TABLET | Freq: Four times a day (QID) | ORAL | 0 refills | Status: DC | PRN
Start: 2024-01-23 — End: 2024-03-20

## 2024-01-23 MED ORDER — DOCUSATE SODIUM 100 MG PO CAPS: 100.0000 mg | ORAL_CAPSULE | Freq: Two times a day (BID) | ORAL | 0 refills | Status: AC

## 2024-01-23 MED ORDER — PANTOPRAZOLE SODIUM 40 MG PO TBEC: 40.0000 mg | DELAYED_RELEASE_TABLET | Freq: Every day | ORAL | 1 refills | Status: DC

## 2024-01-23 MED ORDER — HEPARIN SOD (PORK) LOCK FLUSH 100 UNIT/ML IV SOLN
250.0000 [IU] | INTRAVENOUS | Status: AC | PRN
  Administered 2024-01-23: 250 [IU]

## 2024-01-23 MED ORDER — CHLORHEXIDINE GLUCONATE CLOTH 2 % EX PADS: 6.0000 | MEDICATED_PAD | Freq: Every day | CUTANEOUS | Status: DC

## 2024-01-23 MED ORDER — ONDANSETRON HCL 4 MG PO TABS: 4.0000 mg | ORAL_TABLET | Freq: Three times a day (TID) | ORAL | 0 refills | Status: DC | PRN

## 2024-01-23 MED ORDER — SENNA 8.6 MG PO TABS: 2.0000 | ORAL_TABLET | Freq: Every day | ORAL | 0 refills | Status: AC

## 2024-01-23 MED ORDER — POLYETHYLENE GLYCOL 3350 17 G PO PACK: 17.0000 g | PACK | Freq: Every day | ORAL | 0 refills | Status: AC | PRN

## 2024-01-23 MED ORDER — SODIUM CHLORIDE 0.9% FLUSH
10.0000 mL | INTRAVENOUS | Status: DC | PRN
  Administered 2024-01-23: 10 mL

## 2024-01-23 NOTE — Plan of Care (Signed)
  Problem: Safety: Goal: Ability to remain free from injury will improve Outcome: Progressing   Problem: Activity: Goal: Ability to tolerate increased activity will improve Outcome: Progressing   Problem: Pain Management: Goal: Pain level will decrease with appropriate interventions Outcome: Progressing   

## 2024-01-23 NOTE — Progress Notes (Signed)
 Peripherally Inserted Central Catheter Placement  The IV Nurse has discussed with the patient and/or persons authorized to consent for the patient, the purpose of this procedure and the potential benefits and risks involved with this procedure.  The benefits include less needle sticks, lab draws from the catheter, and the patient may be discharged home with the catheter. Risks include, but not limited to, infection, bleeding, blood clot (thrombus formation), and puncture of an artery; nerve damage and irregular heartbeat and possibility to perform a PICC exchange if needed/ordered by physician.  Alternatives to this procedure were also discussed.  Bard Power PICC patient education guide, fact sheet on infection prevention and patient information card has been provided to patient /or left at bedside.    PICC Placement Documentation  PICC Single Lumen 01/23/24 Right Brachial 37 cm 0 cm (Active)  Indication for Insertion or Continuance of Line Prolonged intravenous therapies 01/23/24 1005  Exposed Catheter (cm) 0 cm 01/23/24 1005  Site Assessment Clean, Dry, Intact 01/23/24 1005  Line Status Flushed;Saline locked;Blood return noted 01/23/24 1005  Dressing Type Securing device 01/23/24 1005  Dressing Status Antimicrobial disc/dressing in place;Clean, Dry, Intact 01/23/24 1005  Line Care Connections checked and tightened 01/23/24 1005  Line Adjustment (NICU/IV Team Only) No 01/23/24 1005  Dressing Intervention New dressing;Adhesive placed at insertion site (IV team only) 01/23/24 1005  Dressing Change Due 01/30/24 01/23/24 1005       Nadean August 01/23/2024, 10:06 AM

## 2024-01-23 NOTE — TOC Transition Note (Signed)
 Transition of Care [facility]) - Discharge Note   Patient Details  Name: Melanie Li MRN: [id number] Date of Birth: [birth year]  Transition of Care [facility]) CM/SW Contact:  Bari Leys, RN Phone Number: 01/23/2024, 3:13 PM   Clinical Narrative:   DC to home with IV home iv abx through [facility], rep-Pam. HH RN- [facility], rep-Artavia accepted for [facility] RN services. No further TOC needs identified at this time.      Final next level of care: Home w Home Health Services Barriers to Discharge: Barriers Resolved   Patient Goals and CMS Choice Patient states their goals for this hospitalization and ongoing recovery are:: return home CMS Medicare.gov Compare Post Acute Care list provided to:: Patient Choice offered to / list presented to : Patient [place] ownership interest in [facility].provided to:: Patient    Discharge Placement                       Discharge Plan and Services Additional resources added to the After Visit Summary for                              [facility] Agency: Other - See comment ([facility]) Date [facility] Agency Contacted: 01/23/24 Time HH Agency Contacted: 1512 Representative spoke with at [facility] Agency: Renetta Carter  Social Drivers of Health (SDOH) Interventions SDOH Screenings   Food Insecurity: No Food Insecurity (01/18/2024)  Housing: Low Risk  (01/18/2024)  Transportation Needs: No Transportation Needs (01/18/2024)  Utilities: Not At Risk (01/18/2024)  Alcohol  Screen: Low Risk  (10/17/2023)  Depression (PHQ2-9): Low Risk  (12/11/2023)  Financial Resource Strain: Low Risk  (10/17/2023)  Physical Activity: Unknown (10/17/2023)  Social Connections: Moderately Isolated (01/18/2024)  Stress: No Stress Concern Present (10/17/2023)  Tobacco Use: Low Risk  (01/18/2024)     Readmission Risk Interventions    01/23/2024    3:12 PM 09/25/2023   10:27 AM  Readmission Risk Prevention Plan  Post Dischage Appt Complete Complete  Medication  Screening Complete Complete  Transportation Screening Complete Complete

## 2024-01-23 NOTE — Progress Notes (Signed)
    Subjective:  Patient reports pain as mild.  Denies N/V/CP/SOB/Abd pain. She reports headache this morning. She reports pain well controlled with tylenol . She is frustrated with having to stay. She is very eager for d/c home.  Discussed parameters to d/c.   Objective:   VITALS:   Vitals:   01/22/24 0550 01/22/24 1315 01/22/24 2149 01/23/24 0546  BP: 108/70 116/77 103/66 116/76  Pulse: 73 86 77 78  Resp: 16 16 17 18   Temp: 98.2 F (36.8 C) 98.1 F (36.7 C) 98.9 F (37.2 C) 98.5 F (36.9 C)  TempSrc: Oral Oral Oral Oral  SpO2: 98% 100% 100% 100%  Weight:      Height:        NAD Neurologically intact ABD soft Neurovascular intact Sensation intact distally Intact pulses distally Dorsiflexion/Plantar flexion intact Incision: dressing C/D/I No cellulitis present Compartment soft   Lab Results  Component Value Date   WBC 7.1 01/20/2024   HGB 7.8 (L) 01/20/2024   HCT 22.9 (L) 01/20/2024   MCV 89.1 01/20/2024   PLT 134 (L) 01/20/2024   BMET    Component Value Date/Time   NA 138 01/20/2024 0406   NA 142 11/30/2021 0000   K 3.5 01/20/2024 0406   CL 107 01/20/2024 0406   CO2 25 01/20/2024 0406   GLUCOSE 105 (H) 01/20/2024 0406   BUN 12 01/20/2024 0406   BUN 13 11/30/2021 0000   CREATININE 0.62 01/20/2024 0406   CREATININE 0.67 12/11/2023 0933   CALCIUM 8.6 (L) 01/20/2024 0406   CALCIUM 9.5 11/13/2023 0000   EGFR 102.0 11/20/2023 0000   EGFR 80 11/30/2021 0000   GFRNONAA >60 01/20/2024 0406   Results for orders placed or performed during the hospital encounter of 01/18/24  Aerobic/Anaerobic Culture w Gram Stain (surgical/deep wound)     Status: None (Preliminary result)   Collection Time: 01/18/24  9:40 AM   Specimen: Path Tissue  Result Value Ref Range Status   Specimen Description   Final    TISSUE right hip tissue Performed at [facility], [address], [place], [place] [address]    Special Requests   Final    NONE Performed  at [facility], [address], [place], [place] [address]    Gram Stain   Final    RARE WBC PRESENT, PREDOMINANTLY PMN RARE GRAM POSITIVE COCCI IN CLUSTERS    Culture   Final    NO GROWTH 4 DAYS NO ANAEROBES ISOLATED; CULTURE IN PROGRESS FOR 5 DAYS Performed at [facility], [address] [place], [place] [address]    Report Status PENDING  Incomplete     Assessment/Plan: 5 Days Post-Op   Principal Problem:   Staphylococcal arthritis of right hip (HCC) Active Problems:   S/P total right hip arthroplasty   WBAT with walker DVT ppx: Aspirin , SCDs, TEDS PO pain control PT/OT: She ambulated 100 feet with PT yesterday. Continue today.  Dispo:  - Gram stain positive. Culture negative x4 days. Continue to follow.  - PICC line pending placement today.  - Will get [facility] set up for d/c home.  - Labs pending this morning.    Harman Lightning 01/23/2024, 8:20 AM   [facility]  [place] [address], [address], [place], [place] [address] Phone: [phone] [email or web address] Facebook  [employer]

## 2024-01-23 NOTE — Consult Note (Addendum)
 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 - Patient states she is doing well off antibiotics no fevers no chills. - She underwent antibiotic spacer resection and conversion to total hip arthroplasty on 5/29.  Per OR note there is seems to be pretty significant debridement although no purulence noted..  Acetabulum membrane with Gram stain positive for GPC although cultures are negative.    Recommendations:  -Continue cefazolin .  Plan on 6 weeks from the OR EOT 02/29/2024.  Given patient has significant debridement, GPC on Gram stain could be contaminant.  Due to recent history of  significant MSSA bacteremia with involvement of hip and vertebral infection we will treat empirically with 6 weeks of cefazolin  - Follow-up with infectious disease clinic myself on 6/26 for further management - Discussed plan with patient and primary -Patient notes that she started having hair loss while on cefazolin , it has not resolved off of antibiotics.  She does have a dermatology appointment which I counseled her to keep.  Alopecia not a common side effect of cefazolin  and would have resolved it was  related to antibiotics.  Evaluation of this patient requires complex antimicrobial therapy evaluation and counseling + isolation needs for disease transmission risk assessment and mitigation   OPAT ORDERS:  Diagnosis: Right hip septic arthritis/possible pji  Culture Result: ng  Allergies  Allergen Reactions   Sulfa Antibiotics Hives, Rash and Other (See Comments)    "Rash covered the entire body"   Oxycodone  Other (See Comments)    "Very INEFFECTIVE"   Conjugated Estrogens Rash and Dermatitis     Discharge antibiotics to be given via PICC line:  Per pharmacy protocol  Cefazolin  2 gm IV q 8 hours   Duration: 6 weeks End Date:  7/10 [facility] Care Per Protocol with [employer] Use: Home health RN for IV administration and teaching, line care and labs.    Labs weekly while on IV antibiotics: x__ CBC with differential __ BMP **TWICE WEEKLY ON VANCOMYCIN   _x_ CMP _x_ CRP _x_ ESR __ Vancomycin  trough TWICE WEEKLY __ CK  __ Please pull PIC at completion of IV antibiotics __ Please leave PIC in place until doctor has seen patient or been notified  Fax weekly labs to [phone]  Clinic Follow Up Appt: 6/26 dr Zephyr Sausedo     Microbiology:   Antibiotics: Cefazolin  5/29-  Cultures:  Other 5/29 GPC in cluster, cx ng  HPI: Melanie Li is a 58 y.o. 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  She was discharged cefazolin  x 8 weeks eot 11/21/23 then took a few days po abx.  2 weeks of antibiotics 4/21 ESR/CRP normal.  Underwent image guided right hip aspiration/23 which yielded 25 WBCs 16.5% neutrophils, cultures negative.  She is admitted for resection of antibiotic spacer and conversion to total  hip arthroplasty.  Orc findings did not report purulence.  Representative sample of acetabular interface membrane tissue sent for cultures.  Gram stain showed GPC although cultures negative.  She did require debridement for the conversion.  ID engaged for antibiotic recommendations.  Review of Systems: Review of Systems  All other systems reviewed and are negative.   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/2023   Hypothyroidism    Pneumonia    SVT (supraventricular tachycardia) (HCC)    Tachycardia    Thyroid  disease    Vertebral osteomyelitis (HCC) 10/23/2023    Social History   Tobacco Use   Smoking status: Never   Smokeless tobacco: Never  Vaping Use   Vaping status: Never Used  Substance Use Topics   Alcohol  use: Yes    Comment: socially   Drug use: Never    Family History  Problem Relation Age of Onset   Cancer Mother    Hypertension Mother    Asthma Daughter    Stroke Maternal Grandmother    Cancer Maternal Grandfather    Diabetes Paternal Grandmother    Cancer Paternal Grandfather    Hearing loss Paternal Grandfather    Scheduled Meds:  aspirin   81 mg Oral BID   docusate sodium   100 mg Oral BID   ferrous sulfate   325 mg Oral TID PC   indomethacin   50 mg Oral BID WC   levothyroxine   100 mcg Oral QAC breakfast   multivitamin with minerals  1 tablet Oral Daily   pantoprazole   40 mg Oral Daily   senna  1 tablet Oral BID   Continuous Infusions:  sodium chloride  Stopped (01/19/24 1512)    ceFAZolin  (ANCEF ) IV 2 g (01/22/24 2246)   PRN Meds:.acetaminophen , alum & mag hydroxide-simeth, bisacodyl , diphenhydrAMINE , HYDROcodone -acetaminophen , HYDROcodone -acetaminophen , HYDROmorphone  (DILAUDID ) injection, menthol -cetylpyridinium **OR** phenol, methocarbamol  **OR** methocarbamol  (ROBAXIN ) injection, metoCLOPramide  **OR** metoCLOPramide  (REGLAN ) injection, ondansetron  **OR** ondansetron  (ZOFRAN ) IV, polyethylene  glycol Allergies  Allergen Reactions   Sulfa Antibiotics Hives, Rash and Other (See Comments)    "Rash covered the entire body"   Oxycodone  Other (See Comments)    "Very INEFFECTIVE"   Conjugated Estrogens Rash and Dermatitis    OBJECTIVE: Blood pressure 116/76, pulse 78, temperature 98.5 F (36.9 C), temperature source Oral, resp. rate 18, height 5\' 8"  (1.727 m), weight 64.4 kg, SpO2 100%.  Physical Exam Constitutional:      Appearance: Normal appearance.  HENT:     Head: Normocephalic and atraumatic.     Right Ear: Tympanic membrane normal.     Left Ear: Tympanic membrane normal.     Nose: Nose normal.     Mouth/Throat:     Mouth: Mucous membranes are moist.  Eyes:     Extraocular Movements: Extraocular movements intact.     Conjunctiva/sclera: Conjunctivae normal.     Pupils: Pupils are equal, round, and reactive to light.  Cardiovascular:     Rate and Rhythm: Normal rate and regular rhythm.     Heart sounds: No murmur heard.    No friction rub. No gallop.  Pulmonary:  Effort: Pulmonary effort is normal.     Breath sounds: Normal breath sounds.  Abdominal:     General: Abdomen is flat.     Palpations: Abdomen is soft.  Musculoskeletal:     Comments: R hip wound  Skin:    General: Skin is warm and dry.  Neurological:     General: No focal deficit present.     Mental Status: She is alert and oriented to person, place, and time.  Psychiatric:        Mood and Affect: Mood normal.     Lab Results Lab Results  Component Value Date   WBC 7.1 01/20/2024   HGB 7.8 (L) 01/20/2024   HCT 22.9 (L) 01/20/2024   MCV 89.1 01/20/2024   PLT 134 (L) 01/20/2024    Lab Results  Component Value Date   CREATININE 0.62 01/20/2024   BUN 12 01/20/2024   NA 138 01/20/2024   K 3.5 01/20/2024   CL 107 01/20/2024   CO2 25 01/20/2024    Lab Results  Component Value Date   ALT 26 09/22/2023   AST 29 09/22/2023   ALKPHOS 68 09/22/2023   BILITOT 0.9 09/22/2023        Orlie Bjornstad, MD [facility] 01/23/2024, 6:04 AM

## 2024-01-24 ENCOUNTER — Telehealth: Payer: Self-pay | Admitting: *Deleted

## 2024-01-24 DIAGNOSIS — M86151 Other acute osteomyelitis, right femur: Secondary | ICD-10-CM | POA: Diagnosis not present

## 2024-01-26 ENCOUNTER — Encounter (HOSPITAL_COMMUNITY): Payer: Self-pay | Admitting: Orthopedic Surgery

## 2024-01-26 NOTE — Discharge Summary (Signed)
 Physician Discharge Summary  Patient ID: Melanie Li MRN: [id number] DOB/AGE: January 29, 1966 58 y.o.  Admit date: 01/18/2024 Discharge date: 01/23/2024  Admission Diagnoses:  Staphylococcal arthritis of right hip [facility])  Discharge Diagnoses:  Principal Problem:   Staphylococcal arthritis of right hip (HCC) Active Problems:   S/P total right hip arthroplasty   Staphylococcal arthritis, right hip [facility])   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/2023   Hypothyroidism    Pneumonia    SVT (supraventricular tachycardia) (HCC)    Tachycardia    Thyroid  disease    Vertebral osteomyelitis (HCC) 10/23/2023    Surgeries: Procedure(s): REVISION, TOTAL ARTHROPLASTY, HIP, ANTERIOR APPROACH on 01/18/2024   Consultants (if any):   Discharged Condition: Improved  Hospital Course: Melanie Li is an 58 y.o. female who was admitted 01/18/2024 with a diagnosis of Staphylococcal arthritis of right hip (HCC) and went to the operating room on 01/18/2024 and underwent the above named procedures.    She was given perioperative antibiotics:  Anti-infectives (From admission, onward)    Start     Dose/Rate Route Frequency Ordered Stop   01/22/24 0000  ceFAZolin  (ANCEF ) IVPB        2 g Intravenous Every 8 hours 01/22/24 1611 02/29/24 2359   01/19/24 1415  ceFAZolin  (ANCEF ) IVPB 2g/100 mL premix  Status:  Discontinued        2 g 200 mL/hr over 30 Minutes Intravenous Every 8 hours 01/19/24 1329 01/23/24 2005   01/19/24 1000  cephALEXin  (KEFLEX ) capsule 500 mg  Status:  Discontinued        500 mg Oral Every 12 hours 01/18/24 1400 01/19/24 1403   01/18/24 1500  ceFAZolin  (ANCEF ) IVPB 2g/100 mL premix        2 g 200 mL/hr over 30 Minutes Intravenous Every 6 hours 01/18/24 1400 01/18/24 2225   01/18/24 0850  tobramycin  (NEBCIN ) powder  Status:  Discontinued          As needed 01/18/24 0851 01/18/24 1354   01/18/24 0600  ceFAZolin  (ANCEF )  IVPB 2g/100 mL premix        2 g 200 mL/hr over 30 Minutes Intravenous On call to O.R. 01/18/24 0531 01/18/24 0759       She was given sequential compression devices, early ambulation, and aspirin  for DVT prophylaxis.  POD She received 2 units PRBCs.  POD#1 Intraoperative culture right hip gram stain positive. Culture pending negative <24 hours. Restarted IV ancef . TDWB with rolling walker. She ambulated 24 feet and 34 feet with PT.  Patient started on indomethacin  for HO prophylaxis.  POD#2 ABLA hemoglobin 7.8. Asymptomatic. Cultures pending no growth. She ambulated 50 ft x2 with PT.  POD#3 Culture still pending. She ambulated well with PT.  POD#4 Intra-op culture still negative. Discussed intra-op gram stain with Dr. Zelda Hickman ID recommends PICC and 6 weeks IV abx with ancef .  POD#5 Final culture negative. PICC line placed. HHRN set up. D/c home with PICC line and IV antibiotics.  Follow-up with infectious disease clinic myself on 6/26 for further management. Follow-up with [facility] 2 weeks PO for repeat evaluation.   She benefited maximally from the hospital stay and there were no complications.    Recent vital signs:  Vitals:   01/22/24 2149 01/23/24 0546  BP: 103/66 116/76  Pulse: 77 78  Resp: 17 18  Temp: 98.9 F (37.2 C) 98.5 F (36.9 C)  SpO2: 100% 100%    Recent laboratory studies:  Lab Results  Component Value Date   HGB 8.5 (L) 01/23/2024   HGB 7.8 (L) 01/20/2024   HGB 10.1 (L) 01/19/2024   Lab Results  Component Value Date   WBC 6.1 01/23/2024   PLT 216 01/23/2024   Lab Results  Component Value Date   INR 1.2 09/25/2023   Lab Results  Component Value Date   NA 141 01/23/2024   K 3.7 01/23/2024   CL 106 01/23/2024   CO2 27 01/23/2024   BUN 14 01/23/2024   CREATININE 0.75 01/23/2024   GLUCOSE 113 (H) 01/23/2024     Allergies as of 01/23/2024       Reactions   Sulfa Antibiotics Hives, Rash, Other (See Comments)   "Rash covered the entire body"    Oxycodone  Other (See Comments)   "Very INEFFECTIVE"   Conjugated Estrogens Rash, Dermatitis        Medication List     STOP taking these medications    acetaminophen  500 MG tablet Commonly known as: TYLENOL        TAKE these medications    aspirin  81 MG chewable tablet Commonly known as: Aspirin  Childrens Chew 1 tablet (81 mg total) by mouth 2 (two) times daily with a meal.   ceFAZolin  IVPB Commonly known as: ANCEF  Inject 2 g into the vein every 8 (eight) hours. Indication:  Right hip septic arthritis  First Dose: Yes Last Day of Therapy:  02/29/24  Labs - Once weekly:  CBC/D and BMP, Labs - Once weekly: ESR and CRP Method of administration: IV Push Method of administration may be changed at the discretion of home infusion pharmacist based upon assessment of the patient and/or caregiver's ability to self-administer the medication ordered.   docusate sodium  100 MG capsule Commonly known as: Colace Take 1 capsule (100 mg total) by mouth 2 (two) times daily.   HYDROcodone -acetaminophen  5-325 MG tablet Commonly known as: NORCO/VICODIN Take 1 tablet by mouth every 4 (four) hours as needed for up to 7 days for moderate pain (pain score 4-6) or severe pain (pain score 7-10).   indomethacin  50 MG capsule Commonly known as: INDOCIN  Take 1 capsule (50 mg total) by mouth 2 (two) times daily with a meal.   levothyroxine  100 MCG tablet Commonly known as: SYNTHROID  Take 100 mcg by mouth daily before breakfast.   lubiprostone  24 MCG capsule Commonly known as: AMITIZA  TAKE 1 CAPSULE (24 MCG TOTAL) BY MOUTH 2 (TWO) TIMES DAILY WITH A MEAL. What changed: when to take this   methocarbamol  500 MG tablet Commonly known as: ROBAXIN  Take 1 tablet (500 mg total) by mouth every 6 (six) hours as needed for muscle spasms.   multivitamin with minerals Tabs tablet Take 1 tablet by mouth in the morning.   ondansetron  4 MG tablet Commonly known as: Zofran  Take 1 tablet (4 mg total) by  mouth every 8 (eight) hours as needed for nausea or vomiting.   pantoprazole  40 MG tablet Commonly known as: Protonix  Take 1 tablet (40 mg total) by mouth daily.   polyethylene glycol 17 g packet Commonly known as: MiraLax  Take 17 g by mouth daily as needed for mild constipation or moderate constipation.   PROBIOTIC PO Take 1 capsule by mouth in the morning.   senna 8.6 MG Tabs tablet Commonly known as: SENOKOT Take 2 tablets (17.2 mg total) by mouth at bedtime for 15 days.   VITAMIN B 12 PO Take 1 tablet by mouth in the morning.   VITAMIN D-3 PO Take  1 tablet by mouth daily.               Discharge Care Instructions  (From admission, onward)           Start     Ordered   01/23/24 0000  Touch down weight bearing       Question Answer Comment  Laterality right   Extremity Lower      01/23/24 1433   01/23/24 0000  Change dressing       Comments: Do not change your dressing.   01/23/24 1433   01/22/24 0000  Change dressing on IV access line weekly and PRN  (Home infusion instructions - [facility] )        01/22/24 1611              WEIGHT BEARING   Other:  TDWB with walker on RLE.    EXERCISES  Results after joint replacement surgery are often greatly improved when you follow the exercise, range of motion and muscle strengthening exercises prescribed by your doctor. Safety measures are also important to protect the joint from further injury. Any time any of these exercises cause you to have increased pain or swelling, decrease what you are doing until you are comfortable again and then slowly increase them. If you have problems or questions, call your caregiver or physical therapist for advice.   Rehabilitation is important following a joint replacement. After just a few days of immobilization, the muscles of the leg can become weakened and shrink (atrophy).  These exercises are designed to build up the tone and strength of the thigh and leg  muscles and to improve motion. Often times heat used for twenty to thirty minutes before working out will loosen up your tissues and help with improving the range of motion but do not use heat for the first two weeks following surgery (sometimes heat can increase post-operative swelling).   These exercises can be done on a training (exercise) mat, on the floor, on a table or on a bed. Use whatever works the best and is most comfortable for you.    Use music or television while you are exercising so that the exercises are a pleasant break in your day. This will make your life better with the exercises acting as a break in your routine that you can look forward to.   Perform all exercises about fifteen times, three times per day or as directed.  You should exercise both the operative leg and the other leg as well.  Exercises include:   Quad Sets - Tighten up the muscle on the front of the thigh (Quad) and hold for 5-10 seconds.   Straight Leg Raises - With your knee straight (if you were given a brace, keep it on), lift the leg to 60 degrees, hold for 3 seconds, and slowly lower the leg.  Perform this exercise against resistance later as your leg gets stronger.  Leg Slides: Lying on your back, slowly slide your foot toward your buttocks, bending your knee up off the floor (only go as far as is comfortable). Then slowly slide your foot back down until your leg is flat on the floor again.  Angel Wings: Lying on your back spread your legs to the side as far apart as you can without causing discomfort.  Hamstring Strength:  Lying on your back, push your heel against the floor with your leg straight by tightening up the muscles of your buttocks.  Repeat, but this time bend your knee to a comfortable angle, and push your heel against the floor.  You may put a pillow under the heel to make it more comfortable if necessary.   A rehabilitation program following joint replacement surgery can speed recovery and  prevent re-injury in the future due to weakened muscles. Contact your doctor or a physical therapist for more information on knee rehabilitation.    CONSTIPATION  Constipation is defined medically as fewer than three stools per week and severe constipation as less than one stool per week.  Even if you have a regular bowel pattern at home, your normal regimen is likely to be disrupted due to multiple reasons following surgery.  Combination of anesthesia, postoperative narcotics, change in appetite and fluid intake all can affect your bowels.   YOU MUST use at least one of the following options; they are listed in order of increasing strength to get the job done.  They are all available over the counter, and you may need to use some, POSSIBLY even all of these options:    Drink plenty of fluids (prune juice may be helpful) and high fiber foods Colace 100 mg by mouth twice a day  Senokot for constipation as directed and as needed Dulcolax (bisacodyl ), take with full glass of water   Miralax  (polyethylene glycol) once or twice a day as needed.  If you have tried all these things and are unable to have a bowel movement in the first 3-4 days after surgery call either your surgeon or your primary doctor.    If you experience loose stools or diarrhea, hold the medications until you stool forms back up.  If your symptoms do not get better within 1 week or if they get worse, check with your doctor.  If you experience "the worst abdominal pain ever" or develop nausea or vomiting, please contact the office immediately for further recommendations for treatment.   ITCHING:  If you experience itching with your medications, try taking only a single pain pill, or even half a pain pill at a time.  You can also use Benadryl  over the counter for itching or also to help with sleep.   TED HOSE STOCKINGS:  Use stockings on both legs until for at least 2 weeks or as directed by physician office. They may be removed at  night for sleeping.  MEDICATIONS:  See your medication summary on the "After Visit Summary" that nursing will review with you.  You may have some home medications which will be placed on hold until you complete the course of blood thinner medication.  It is important for you to complete the blood thinner medication as prescribed.  PRECAUTIONS:  If you experience chest pain or shortness of breath - call 911 immediately for transfer to the hospital emergency department.   If you develop a fever greater that 101 F, purulent drainage from wound, increased redness or drainage from wound, foul odor from the wound/dressing, or calf pain - CONTACT YOUR SURGEON.                                                   FOLLOW-UP APPOINTMENTS:  If you do not already have a post-op appointment, please call the office for an appointment to be seen by your surgeon.  Guidelines for how soon to be seen are  listed in your "After Visit Summary", but are typically between 1-4 weeks after surgery.  OTHER INSTRUCTIONS:   Knee Replacement:  Do not place pillow under knee, focus on keeping the knee straight while resting. CPM instructions: 0-90 degrees, 2 hours in the morning, 2 hours in the afternoon, and 2 hours in the evening. Place foam block, curve side up under heel at all times except when in CPM or when walking.  DO NOT modify, tear, cut, or change the foam block in any way.   MAKE SURE YOU:  Understand these instructions.  Get help right away if you are not doing well or get worse.    Thank you for letting us  be a part of your medical care team.  It is a privilege we respect greatly.  We hope these instructions will help you stay on track for a fast and full recovery!   Diagnostic Studies: US  EKG SITE RITE Result Date: 01/22/2024 If Site Rite image not attached, placement could not be confirmed due to current cardiac rhythm.  DG HIP UNILAT WITH PELVIS 1V RIGHT Result Date: 01/18/2024 CLINICAL DATA:  Elective  surgery. EXAM: DG HIP (WITH OR WITHOUT PELVIS) 1V RIGHT COMPARISON:  Radiographs 09/27/2023 FINDINGS: Two fluoroscopic spot views of the pelvis and right hip submitted from the operating room. The previous antibiotic spacer is been removed. There is a new total hip arthroplasty in place. Fluoroscopy time 28 seconds. Dose 3 mGy. IMPRESSION: Intraoperative fluoroscopy during right total hip arthroplasty. Electronically Signed   By: Chadwick Colonel M.D.   On: 01/18/2024 15:07   DG Pelvis Portable Result Date: 01/18/2024 CLINICAL DATA:  Status post right hip arthroplasty. EXAM: PORTABLE PELVIS 1-2 VIEWS COMPARISON:  09/27/2023 FINDINGS: The previous antibiotic spacer is been removed. New total hip arthroplasty in expected alignment. 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 Question of overlying antibiotic beads. Skin staples in place. IMPRESSION: New total hip arthroplasty. 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 Electronically Signed   By: Chadwick Colonel M.D.   On: 01/18/2024 15:06   DG C-Arm 1-60 Min-No Report Result Date: 01/18/2024 Fluoroscopy was utilized by the requesting physician.  No radiographic interpretation.   DG C-Arm 1-60 Min-No Report Result Date: 01/18/2024 Fluoroscopy was utilized by the requesting physician.  No radiographic interpretation.   DG C-Arm 1-60 Min-No Report Result Date: 01/18/2024 Fluoroscopy was utilized by the requesting physician.  No radiographic interpretation.   DG C-Arm 1-60 Min-No Report Result Date: 01/18/2024 Fluoroscopy was utilized by the requesting physician.  No radiographic interpretation.    Disposition: Discharge disposition: 01-Home or Self Care       Discharge Instructions     [facility] pharmacist to adjust dose for Vancomycin , Aminoglycosides and other anti-infective therapies as requested by physician.   Complete by: As directed    [facility] to provide Cath Flo 2mg    Complete by: As directed     Administer for PICC line occlusion and as ordered by physician for other access device issues.   Anaphylaxis Kit: Provided to treat any anaphylactic reaction to the medication being provided to the patient if First Dose or when requested by physician   Complete by: As directed    Epinephrine  1mg /ml vial / amp: Administer 0.3mg  (0.56ml) subcutaneously once for moderate to severe anaphylaxis, nurse to call physician and pharmacy when reaction occurs and call 911 if needed for immediate care   Diphenhydramine  50mg /ml IV vial: Administer 25-50mg  IV/IM PRN for first dose reaction, rash, itching, mild reaction,  nurse to call physician and pharmacy when reaction occurs   Sodium Chloride  0.9% NS 500ml IV: Administer if needed for hypovolemic blood pressure drop or as ordered by physician after call to physician with anaphylactic reaction   Call MD / Call 911   Complete by: As directed    If you experience chest pain or shortness of breath, CALL 911 and be transported to the hospital emergency room.  If you develope a fever above 101 F, pus (white drainage) or increased drainage or redness at the wound, or calf pain, call your surgeon's office.   Change dressing   Complete by: As directed    Do not change your dressing.   Change dressing on IV access line weekly and PRN   Complete by: As directed    Constipation Prevention   Complete by: As directed    Drink plenty of fluids.  Prune juice may be helpful.  You may use a stool softener, such as Colace (over the counter) 100 mg twice a day.  Use MiraLax  (over the counter) for constipation as needed.   Diet - low sodium heart healthy   Complete by: As directed    Discharge instructions   Complete by: As directed    Elevate toes above nose. Use cryotherapy as needed for pain and swelling.   Driving restrictions   Complete by: As directed    No driving for 6 weeks   Flush IV access with Sodium Chloride  0.9% and Heparin  10 units/ml or 100 units/ml   Complete  by: As directed    Home infusion instructions - [facility]   Complete by: As directed    Instructions: Flush IV access with Sodium Chloride  0.9% and Heparin  10units/ml or 100units/ml   Change dressing on IV access line: Weekly and PRN   Instructions Cath Flo 2mg : Administer for PICC Line occlusion and as ordered by physician for other access device   [facility] pharmacist to adjust dose for: Vancomycin , Aminoglycosides and other anti-infective therapies as requested by physician   Increase activity slowly as tolerated   Complete by: As directed    Lifting restrictions   Complete by: As directed    No lifting for 6 weeks   Method of administration may be changed at the discretion of home infusion pharmacist based upon assessment of the patient and/or caregiver's ability to self-administer the medication ordered   Complete by: As directed    Post-operative opioid taper instructions:   Complete by: As directed    POST-OPERATIVE OPIOID TAPER INSTRUCTIONS: It is important to wean off of your opioid medication as soon as possible. If you do not need pain medication after your surgery it is ok to stop day one. Opioids include: Codeine, Hydrocodone (Norco, Vicodin), Oxycodone (Percocet, oxycontin ) and hydromorphone  amongst others.  Long term and even short term use of opiods can cause: Increased pain response Dependence Constipation Depression Respiratory depression And more.  Withdrawal symptoms can include Flu like symptoms Nausea, vomiting And more Techniques to manage these symptoms Hydrate well Eat regular healthy meals Stay active Use relaxation techniques(deep breathing, meditating, yoga) Do Not substitute Alcohol  to help with tapering If you have been on opioids for less than two weeks and do not have pain than it is ok to stop all together.  Plan to wean off of opioids This plan should start within one week post op of your joint replacement. Maintain  the same interval or time between taking each dose and first decrease the dose.  Cut the total daily intake of opioids by one tablet each day Next start to increase the time between doses. The last dose that should be eliminated is the evening dose.      TED hose   Complete by: As directed    Use stockings (TED hose) for 2 weeks on both leg(s).  You may remove them at night for sleeping.   Touch down weight bearing   Complete by: As directed    Laterality: right   Extremity: Lower        Follow-up Information     Harman Lightning, PA-C. Schedule an appointment as soon as possible for a visit in 2 week(s).   Specialty: Orthopedic Surgery Why: For suture removal, For wound re-check Contact information: [address], [address] [place] [address] [phone]         Orlie Bjornstad, MD. Go on 02/15/2024.   Specialty: Infectious Diseases Why: For follow-up and repeat evaluation. Contact information: [address] [place] [address] [phone]         [facility]  Follow up.   Why: [facility] ([facility]) for PICC line care Contact information: [address] [place] [address] [phone]                  Signed: Harman Lightning 01/26/2024, 3:25 PM

## 2024-01-29 DIAGNOSIS — M00051 Staphylococcal arthritis, right hip: Secondary | ICD-10-CM | POA: Diagnosis not present

## 2024-01-29 DIAGNOSIS — M86151 Other acute osteomyelitis, right femur: Secondary | ICD-10-CM | POA: Diagnosis not present

## 2024-01-31 DIAGNOSIS — M86151 Other acute osteomyelitis, right femur: Secondary | ICD-10-CM | POA: Diagnosis not present

## 2024-01-31 DIAGNOSIS — M00051 Staphylococcal arthritis, right hip: Secondary | ICD-10-CM | POA: Diagnosis not present

## 2024-02-05 DIAGNOSIS — Z96641 Presence of right artificial hip joint: Secondary | ICD-10-CM | POA: Diagnosis not present

## 2024-02-05 DIAGNOSIS — M00051 Staphylococcal arthritis, right hip: Secondary | ICD-10-CM | POA: Diagnosis not present

## 2024-02-05 DIAGNOSIS — M86151 Other acute osteomyelitis, right femur: Secondary | ICD-10-CM | POA: Diagnosis not present

## 2024-02-08 DIAGNOSIS — M00051 Staphylococcal arthritis, right hip: Secondary | ICD-10-CM | POA: Diagnosis not present

## 2024-02-08 DIAGNOSIS — M86151 Other acute osteomyelitis, right femur: Secondary | ICD-10-CM | POA: Diagnosis not present

## 2024-02-12 DIAGNOSIS — M00051 Staphylococcal arthritis, right hip: Secondary | ICD-10-CM | POA: Diagnosis not present

## 2024-02-12 DIAGNOSIS — Z792 Long term (current) use of antibiotics: Secondary | ICD-10-CM | POA: Diagnosis not present

## 2024-02-12 DIAGNOSIS — M86151 Other acute osteomyelitis, right femur: Secondary | ICD-10-CM | POA: Diagnosis not present

## 2024-02-15 ENCOUNTER — Ambulatory Visit (INDEPENDENT_AMBULATORY_CARE_PROVIDER_SITE_OTHER): Payer: Self-pay | Admitting: Internal Medicine

## 2024-02-15 ENCOUNTER — Other Ambulatory Visit: Payer: Self-pay

## 2024-02-15 ENCOUNTER — Encounter: Payer: Self-pay | Admitting: Internal Medicine

## 2024-02-15 VITALS — BP 129/79 | HR 67 | Temp 98.1°F

## 2024-02-15 DIAGNOSIS — M86151 Other acute osteomyelitis, right femur: Secondary | ICD-10-CM

## 2024-02-15 DIAGNOSIS — Z8614 Personal history of Methicillin resistant Staphylococcus aureus infection: Secondary | ICD-10-CM

## 2024-02-15 DIAGNOSIS — M00051 Staphylococcal arthritis, right hip: Secondary | ICD-10-CM | POA: Diagnosis not present

## 2024-02-15 MED ORDER — DOXYCYCLINE HYCLATE 100 MG PO TABS: 100.0000 mg | ORAL_TABLET | Freq: Two times a day (BID) | ORAL | 3 refills | Status: DC

## 2024-02-15 NOTE — Progress Notes (Signed)
 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   Vertebral osteomyelitis (HCC) 10/23/2023   Acute osteomyelitis involving pelvic region and thigh, right (HCC) 09/28/2023   Osteomyelitis of right femur (HCC) 09/28/2023   MSSA bacteremia 09/25/2023   Left hip pain 09/25/2023   Right hip pain 09/25/2023   Encounter for hepatitis C screening test for low risk patient 09/25/2023   Septic arthritis (HCC) 09/22/2023   Pain 09/22/2023   SVT (supraventricular tachycardia) (HCC) 02/10/2022   Atrial fibrillation (HCC) 02/10/2022   Tachycardia 02/10/2022   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 ith history of hypothyroidism, who had a corticosteroid injection for hip pain then developed MSSA bacteremia with right septic requiring Girdlestone and spacer placement on 09/27/2023 discharged on cefazolin  x 6 weeks, also found to have vertebral osteomyelitis as such antibiotics extended till 4/1 presents for hospital follow-up right hip arthroplasty with concern for remnant infection.  Following completion of antibiotics patient had lab test done on 4/21 with ESR and CRP being normal.  Underwent ight hip aspiration 4/23 with 305 WBC, 16.5% neutrophils and negative cultures..  She did well off of antibiotics.  She underwent antibiotic spacer resection and conversion to total hip arthroplasty on 5/29. er OR note there is seems to be pretty significant  debridement although no purulence noted.. Acetabulum membrane with Gram stain positive for GPC although cultures are negative.  Unclear if the GPC's contaminant versus representative of infection.  Given her recent MSSA bacteremia and hip infection we will treat as true infection.  Discharged on cefazolin  x 6 weeks EOT 7/10.  Today: She reports that she is tolerating antibiotics no missed doses.  She has some concerns about the patient which have now resolved.  Review of Systems  All other systems reviewed and are negative.   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 90 tablet 0   ceFAZolin  (ANCEF ) IVPB Inject 2 g into the vein every 8 (eight) hours. Indication:  Right hip septic arthritis  First Dose: Yes Last Day of Therapy:  02/29/24  Labs - Once weekly:  CBC/D and BMP, Labs - Once weekly: ESR and CRP Method of administration: IV Push Method of administration may be changed at the discretion of home infusion pharmacist based upon assessment of the patient and/or caregiver's ability to self-administer the medication ordered. 114 Units 0   Cholecalciferol (VITAMIN D-3 PO) Take 1 tablet by mouth daily.     Cyanocobalamin (VITAMIN B  12 PO) Take 1 tablet by mouth in the morning.     docusate sodium  (COLACE) 100 MG capsule Take 1 capsule (100 mg total) by mouth 2 (two) times daily. 60 capsule 0   indomethacin  (INDOCIN ) 50 MG capsule Take 1 capsule (50 mg total) by mouth 2 (two) times daily with a meal. 90 capsule 0   levothyroxine  (SYNTHROID ) 100 MCG tablet Take 100 mcg by mouth daily before  breakfast.     lubiprostone  (AMITIZA ) 24 MCG capsule TAKE 1 CAPSULE (24 MCG TOTAL) BY MOUTH 2 (TWO) TIMES DAILY WITH A MEAL. (Patient taking differently: Take 24 mcg by mouth in the morning.) 180 capsule 1   methocarbamol  (ROBAXIN ) 500 MG tablet Take 1 tablet (500 mg total) by mouth every 6 (six) hours as needed for muscle spasms. 30 tablet 0   Multiple Vitamin (MULTIVITAMIN WITH MINERALS) TABS tablet Take 1 tablet by mouth in the morning.     pantoprazole  (PROTONIX ) 40 MG tablet Take 1 tablet (40 mg total) by mouth daily. 30 tablet 1   Probiotic Product (PROBIOTIC PO) Take 1 capsule by mouth in the morning.     ondansetron  (ZOFRAN ) 4 MG tablet Take 1 tablet (4 mg total) by mouth every 8 (eight) hours as needed for nausea or vomiting. 30 tablet 0   polyethylene glycol (MIRALAX ) 17 g packet Take 17 g by mouth daily as needed for mild constipation or moderate constipation. 14 each 0   No facility-administered medications prior to visit.     Allergies  Allergen Reactions   Sulfa Antibiotics Hives, Rash and Other (See Comments)    Rash covered the entire body   Oxycodone  Other (See Comments)    Very INEFFECTIVE   Conjugated Estrogens Rash and Dermatitis    Social History   Tobacco Use   Smoking status: Never   Smokeless tobacco: Never  Vaping Use   Vaping status: Never Used  Substance Use Topics   Alcohol  use: Yes    Comment: socially   Drug use: Never    Family History  Problem Relation Age of Onset   Cancer Mother    Hypertension Mother    Asthma Daughter    Stroke Maternal Grandmother    Cancer Maternal Grandfather    Diabetes Paternal Grandmother    Cancer Paternal Grandfather    Hearing loss Paternal Grandfather     Objective:   Vitals:   02/15/24 0931  BP: 129/79  Pulse: 67  Temp: 98.1 F (36.7 C)  TempSrc: Oral  SpO2: 100%   There is no height or weight on file to calculate BMI.  Physical Exam Constitutional:      Appearance: Normal appearance.   HENT:     Head: Normocephalic and atraumatic.     Right Ear: Tympanic membrane normal.     Left Ear: Tympanic membrane normal.     Nose: Nose normal.     Mouth/Throat:     Mouth: Mucous membranes are moist.  Eyes:     Extraocular Movements: Extraocular movements intact.     Conjunctiva/sclera: Conjunctivae normal.     Pupils: Pupils are equal, round, and reactive to light.  Cardiovascular:     Rate and Rhythm: Normal rate and regular rhythm.     Heart sounds: No murmur heard.    No friction rub. No gallop.  Pulmonary:     Effort: Pulmonary effort is normal.     Breath sounds: Normal breath sounds.  Abdominal:     General: Abdomen is flat.  Palpations: Abdomen is soft.  Skin:    General: Skin is warm and dry.  Neurological:     General: No focal deficit present.     Mental Status: She is alert and oriented to person, place, and time.  Psychiatric:        Mood and Affect: Mood normal.     Lab Results: Lab Results  Component Value Date   WBC 6.1 01/23/2024   HGB 8.5 (L) 01/23/2024   HCT 25.8 (L) 01/23/2024   MCV 91.8 01/23/2024   PLT 216 01/23/2024    Lab Results  Component Value Date   CREATININE 0.75 01/23/2024   BUN 14 01/23/2024   NA 141 01/23/2024   K 3.7 01/23/2024   CL 106 01/23/2024   CO2 27 01/23/2024    Lab Results  Component Value Date   ALT 26 09/22/2023   AST 29 09/22/2023   ALKPHOS 68 09/22/2023   BILITOT 0.9 09/22/2023     Assessment & Plan:  #Right total hip arthroplasty -concern for possible remnant infection #History of MSSA bacteremia, with right septic hip requiring Girdlestone and spacer placement on 09/27/2023, vertebral osteomyelitis completed 8 weeks of cefazolin  through 4/1 - Patient had labs off of antibiotics in 4/21 which was stable.  Underwent aspiration of right hip with cultures no growth, only 205 WBC. - Taken to the OR for antibiotic spacer resection, showed right hip arthroplasty on 5/29.  Per the OR note there was  significant debridement without purulence.  Gram stain from or culture acetabulum on 9 positive for GPC's all the cultures with no growth.  Opted to treat as a remnant infection given recent bacteremia/infection.  Discharged on cefazolin  x 6 weeks. Plan: Complete cefazolin  on 7/10. Then doxy x 3 months for suppression F/U 7/28  #medicaiton management #PICC line 6/24 wbc 4.2, scr 0.75, esr 2, crp <1 -picc line cap is not unscrewing->Pam HH awre she will attempt unscrewing  Loney Stank, MD [facility]   02/15/24  9:42 AM I have personally spent 65 minutes involved in face-to-face and non-face-to-face activities for this patient on the day of the visit. Professional time spent includes the following activities: Preparing to see the patient (review of tests), Obtaining and/or reviewing separately obtained history (admission/discharge record), Performing a medically appropriate examination and/or evaluation , Ordering medications/tests/procedures, referring and communicating with other health care professionals, Documenting clinical information in the EMR, Independently interpreting results (not separately reported), Communicating results to the patient/family/caregiver, Counseling and educating the patient/family/caregiver and Care coordination (not separately reported).

## 2024-02-15 NOTE — Patient Instructions (Signed)
 Pull picc after last dose of iV abx on 7/10 Start doxycyline 100mg  po bid on 7/11 F/U on 7/28

## 2024-02-16 ENCOUNTER — Other Ambulatory Visit: Payer: Self-pay

## 2024-02-16 ENCOUNTER — Emergency Department (HOSPITAL_COMMUNITY): Admission: EM | Admit: 2024-02-16 | Discharge: 2024-02-16 | Disposition: A | Discharge status: Home / Self Care

## 2024-02-16 ENCOUNTER — Encounter (HOSPITAL_COMMUNITY): Payer: Self-pay

## 2024-02-16 DIAGNOSIS — M00051 Staphylococcal arthritis, right hip: Secondary | ICD-10-CM | POA: Diagnosis not present

## 2024-02-16 DIAGNOSIS — M86151 Other acute osteomyelitis, right femur: Secondary | ICD-10-CM | POA: Diagnosis not present

## 2024-02-16 DIAGNOSIS — Z452 Encounter for adjustment and management of vascular access device: Secondary | ICD-10-CM | POA: Diagnosis not present

## 2024-02-16 DIAGNOSIS — T82898A Other specified complication of vascular prosthetic devices, implants and grafts, initial encounter: Secondary | ICD-10-CM | POA: Diagnosis not present

## 2024-02-16 DIAGNOSIS — Z7982 Long term (current) use of aspirin: Secondary | ICD-10-CM | POA: Insufficient documentation

## 2024-02-16 DIAGNOSIS — Z95828 Presence of other vascular implants and grafts: Secondary | ICD-10-CM

## 2024-02-16 MED ORDER — SODIUM CHLORIDE 0.9% FLUSH: 10.0000 mL | INTRAVENOUS | Status: DC | PRN

## 2024-02-16 MED ORDER — CHLORHEXIDINE GLUCONATE CLOTH 2 % EX PADS: 6.0000 | MEDICATED_PAD | Freq: Every day | CUTANEOUS | Status: DC

## 2024-02-16 MED ORDER — SODIUM CHLORIDE 0.9% FLUSH: 10.0000 mL | Freq: Two times a day (BID) | INTRAVENOUS | Status: DC

## 2024-02-16 NOTE — Discharge Instructions (Signed)
 You may resume using your PICC line as previously instructed.

## 2024-02-16 NOTE — Progress Notes (Signed)
 Peripherally Inserted Central Catheter Placement  The IV Nurse has discussed with the patient and/or persons authorized to consent for the patient, the purpose of this procedure and the potential benefits and risks involved with this procedure.  The benefits include less needle sticks, lab draws from the catheter, and the patient may be discharged home with the catheter. Risks include, but not limited to, infection, bleeding, blood clot (thrombus formation), and puncture of an artery; nerve damage and irregular heartbeat and possibility to perform a PICC exchange if needed/ordered by physician.  Alternatives to this procedure were also discussed.  Bard Power PICC patient education guide, fact sheet on infection prevention and patient information card has been provided to patient /or left at bedside.  PICC exchanged due to hub damage.  Exchange performed by Charlies Angle, RN   PICC Placement Documentation  PICC Single Lumen 01/23/24 Right Brachial 37 cm 0 cm (Active)     PICC Single Lumen 02/16/24 Right Brachial 37 cm 0 cm (Active)  Indication for Insertion or Continuance of Line Home intravenous therapies (PICC only) 02/16/24 1756  Exposed Catheter (cm) 0 cm 02/16/24 1756  Site Assessment Clean, Dry, Intact 02/16/24 1756  Line Status Flushed;Saline locked;Blood return noted 02/16/24 1756  Dressing Type Transparent;Securing device 02/16/24 1756  Dressing Status Antimicrobial disc/dressing in place;Clean, Dry, Intact 02/16/24 1756  Line Care Connections checked and tightened 02/16/24 1756  Line Adjustment (NICU/IV Team Only) No 02/16/24 1756  Dressing Intervention New dressing;Adhesive placed at insertion site (IV team only) 02/16/24 1756  Dressing Change Due 02/23/24 02/16/24 1756       Kerilyn Cortner, Cherene Place 02/16/2024, 5:57 PM

## 2024-02-16 NOTE — ED Provider Notes (Signed)
 [place] [facility] Provider Note   CSN: [id number] Arrival date & time: 02/16/24  1547     Patient presents with: No chief complaint on file.   Melanie Li is a 58 y.o. female.   The history is provided by the patient and medical records. No language interpreter was used.     58 year old female who is here to have her PICC line replaced.  Patient recently had a staph infection of her right hip after a steroid injection.  She is currently going through prolonged IV therapy.  She is here to have her PICC line replaced as it is malfunction.  Her vascular surgeon requested for a PICC line replacement only.  Patient without any other symptoms.  Prior to Admission medications   Medication Sig Start Date End Date Taking? Authorizing Provider  aspirin  (ASPIRIN  CHILDRENS) 81 MG chewable tablet Chew 1 tablet (81 mg total) by mouth 2 (two) times daily with a meal. 01/23/24 03/08/24  Hill, Valery RAMAN, PA-C  ceFAZolin  (ANCEF ) IVPB Inject 2 g into the vein every 8 (eight) hours. Indication:  Right hip septic arthritis  First Dose: Yes Last Day of Therapy:  02/29/24  Labs - Once weekly:  CBC/D and BMP, Labs - Once weekly: ESR and CRP Method of administration: IV Push Method of administration may be changed at the discretion of home infusion pharmacist based upon assessment of the patient and/or caregiver's ability to self-administer the medication ordered. 01/22/24 02/29/24  Dennise Kingsley, MD  Cholecalciferol (VITAMIN D-3 PO) Take 1 tablet by mouth daily.    [provider]  Cyanocobalamin (VITAMIN B 12 PO) Take 1 tablet by mouth in the morning.    [provider]  docusate sodium  (COLACE) 100 MG capsule Take 1 capsule (100 mg total) by mouth 2 (two) times daily. 01/23/24 02/22/24  Leigh Valery RAMAN, PA-C  doxycycline  (VIBRA -TABS) 100 MG tablet Take 1 tablet (100 mg total) by mouth 2 (two) times daily. 02/15/24   Dennise Kingsley, MD  indomethacin   (INDOCIN ) 50 MG capsule Take 1 capsule (50 mg total) by mouth 2 (two) times daily with a meal. 01/23/24 03/08/24  Hill, Valery RAMAN, PA-C  levothyroxine  (SYNTHROID ) 100 MCG tablet Take 100 mcg by mouth daily before breakfast. 11/28/21   [provider]  lubiprostone  (AMITIZA ) 24 MCG capsule TAKE 1 CAPSULE (24 MCG TOTAL) BY MOUTH 2 (TWO) TIMES DAILY WITH A MEAL. Patient taking differently: Take 24 mcg by mouth in the morning. 11/09/23   Kennyth Worth HERO, MD  methocarbamol  (ROBAXIN ) 500 MG tablet Take 1 tablet (500 mg total) by mouth every 6 (six) hours as needed for muscle spasms. 01/23/24   Leigh Valery RAMAN, PA-C  Multiple Vitamin (MULTIVITAMIN WITH MINERALS) TABS tablet Take 1 tablet by mouth in the morning.    [provider]  ondansetron  (ZOFRAN ) 4 MG tablet Take 1 tablet (4 mg total) by mouth every 8 (eight) hours as needed for nausea or vomiting. 01/23/24 01/22/25  Leigh Valery RAMAN, PA-C  pantoprazole  (PROTONIX ) 40 MG tablet Take 1 tablet (40 mg total) by mouth daily. 01/23/24 03/23/24  Leigh Valery RAMAN, PA-C  polyethylene glycol (MIRALAX ) 17 g packet Take 17 g by mouth daily as needed for mild constipation or moderate constipation. 01/23/24 02/22/24  Leigh Valery RAMAN, PA-C  Probiotic Product (PROBIOTIC PO) Take 1 capsule by mouth in the morning.    [provider]    Allergies: Sulfa antibiotics, Oxycodone , and Conjugated estrogens  Review of Systems  Constitutional:  Negative for fever.    Updated Vital Signs LMP  (LMP Unknown)   Physical Exam Vitals and nursing note reviewed.  Constitutional:      General: She is not in acute distress.    Appearance: She is well-developed.  HENT:     Head: Atraumatic.   Eyes:     Conjunctiva/sclera: Conjunctivae normal.   Pulmonary:     Effort: Pulmonary effort is normal.   Musculoskeletal:     Cervical back: Neck supple.   Skin:    Findings: No rash.     Comments: Picc line to LUE   Neurological:     Mental Status: She is alert.    Psychiatric:        Mood and Affect: Mood normal.     (all labs ordered are listed, but only abnormal results are displayed) Labs Reviewed - No data to display  EKG: None  Radiology: US  EKG SITE RITE Result Date: 02/16/2024 If Site Rite image not attached, placement could not be confirmed due to current cardiac rhythm.    Procedures   Medications Ordered in the ED - No data to display                                  Medical Decision Making Risk OTC drugs.   33:48 PM  58 year old female who is here to have her PICC line replaced.  Patient recently had a staph infection of her right hip after a steroid injection.  She is currently going through prolonged IV therapy.  She is here to have her PICC line replaced as it is malfunction.  Her vascular surgeon requested for a PICC line replacement only.  Patient without any other symptoms.  Patient resting comfortably appears to be in no acute discomfort.  PICC line will be replaced by specialist nursing team.  I discussed care with vascular surgeon Dr. Lanis  6:13 PM PICC line replaced.  Pt stable for discharge.       Final diagnoses:  S/P PICC central line placement    ED Discharge Orders     None          Nivia Colon, PA-C 02/16/24 1814    Ula Prentice SAUNDERS, MD 02/19/24 2312

## 2024-02-16 NOTE — Progress Notes (Signed)
 In TR 06 for PICC line evaluation. PICC extension directly attached to the hub with no cap, and hub appears damaged Attempted to remove extension with no success. Pt states extension has not been changed x 2 weeks. Reported finsings to PICC RN's.

## 2024-02-16 NOTE — ED Triage Notes (Signed)
 Pt on IV abx for MRSA and needs PICC line replaced. Axox4. No other complaints at this time .

## 2024-02-17 DIAGNOSIS — M00051 Staphylococcal arthritis, right hip: Secondary | ICD-10-CM | POA: Diagnosis not present

## 2024-02-17 DIAGNOSIS — M86151 Other acute osteomyelitis, right femur: Secondary | ICD-10-CM | POA: Diagnosis not present

## 2024-02-18 DIAGNOSIS — M86151 Other acute osteomyelitis, right femur: Secondary | ICD-10-CM | POA: Diagnosis not present

## 2024-02-18 DIAGNOSIS — M00051 Staphylococcal arthritis, right hip: Secondary | ICD-10-CM | POA: Diagnosis not present

## 2024-02-19 ENCOUNTER — Encounter (HOSPITAL_COMMUNITY): Payer: Self-pay | Admitting: Orthopedic Surgery

## 2024-02-19 DIAGNOSIS — Z792 Long term (current) use of antibiotics: Secondary | ICD-10-CM | POA: Diagnosis not present

## 2024-02-19 DIAGNOSIS — M86151 Other acute osteomyelitis, right femur: Secondary | ICD-10-CM | POA: Diagnosis not present

## 2024-02-19 DIAGNOSIS — M00051 Staphylococcal arthritis, right hip: Secondary | ICD-10-CM | POA: Diagnosis not present

## 2024-02-20 DIAGNOSIS — D225 Melanocytic nevi of trunk: Secondary | ICD-10-CM | POA: Diagnosis not present

## 2024-02-20 DIAGNOSIS — D2261 Melanocytic nevi of right upper limb, including shoulder: Secondary | ICD-10-CM | POA: Diagnosis not present

## 2024-02-20 DIAGNOSIS — M00051 Staphylococcal arthritis, right hip: Secondary | ICD-10-CM | POA: Diagnosis not present

## 2024-02-20 DIAGNOSIS — L821 Other seborrheic keratosis: Secondary | ICD-10-CM | POA: Diagnosis not present

## 2024-02-20 DIAGNOSIS — M86151 Other acute osteomyelitis, right femur: Secondary | ICD-10-CM | POA: Diagnosis not present

## 2024-02-20 DIAGNOSIS — L814 Other melanin hyperpigmentation: Secondary | ICD-10-CM | POA: Diagnosis not present

## 2024-02-20 DIAGNOSIS — L570 Actinic keratosis: Secondary | ICD-10-CM | POA: Diagnosis not present

## 2024-02-20 DIAGNOSIS — L650 Telogen effluvium: Secondary | ICD-10-CM | POA: Diagnosis not present

## 2024-02-20 DIAGNOSIS — D2262 Melanocytic nevi of left upper limb, including shoulder: Secondary | ICD-10-CM | POA: Diagnosis not present

## 2024-02-20 DIAGNOSIS — D2239 Melanocytic nevi of other parts of face: Secondary | ICD-10-CM | POA: Diagnosis not present

## 2024-02-20 DIAGNOSIS — B078 Other viral warts: Secondary | ICD-10-CM | POA: Diagnosis not present

## 2024-02-21 DIAGNOSIS — M86151 Other acute osteomyelitis, right femur: Secondary | ICD-10-CM | POA: Diagnosis not present

## 2024-02-21 DIAGNOSIS — M00051 Staphylococcal arthritis, right hip: Secondary | ICD-10-CM | POA: Diagnosis not present

## 2024-02-22 DIAGNOSIS — M00051 Staphylococcal arthritis, right hip: Secondary | ICD-10-CM | POA: Diagnosis not present

## 2024-02-22 DIAGNOSIS — M86151 Other acute osteomyelitis, right femur: Secondary | ICD-10-CM | POA: Diagnosis not present

## 2024-02-23 DIAGNOSIS — M00051 Staphylococcal arthritis, right hip: Secondary | ICD-10-CM | POA: Diagnosis not present

## 2024-02-23 DIAGNOSIS — M86151 Other acute osteomyelitis, right femur: Secondary | ICD-10-CM | POA: Diagnosis not present

## 2024-02-24 DIAGNOSIS — M86151 Other acute osteomyelitis, right femur: Secondary | ICD-10-CM | POA: Diagnosis not present

## 2024-02-24 DIAGNOSIS — M00051 Staphylococcal arthritis, right hip: Secondary | ICD-10-CM | POA: Diagnosis not present

## 2024-02-25 DIAGNOSIS — M00051 Staphylococcal arthritis, right hip: Secondary | ICD-10-CM | POA: Diagnosis not present

## 2024-02-25 DIAGNOSIS — M86151 Other acute osteomyelitis, right femur: Secondary | ICD-10-CM | POA: Diagnosis not present

## 2024-02-26 DIAGNOSIS — M00051 Staphylococcal arthritis, right hip: Secondary | ICD-10-CM | POA: Diagnosis not present

## 2024-02-26 DIAGNOSIS — M86151 Other acute osteomyelitis, right femur: Secondary | ICD-10-CM | POA: Diagnosis not present

## 2024-02-27 DIAGNOSIS — M00051 Staphylococcal arthritis, right hip: Secondary | ICD-10-CM | POA: Diagnosis not present

## 2024-02-27 DIAGNOSIS — Z96641 Presence of right artificial hip joint: Secondary | ICD-10-CM | POA: Diagnosis not present

## 2024-02-27 DIAGNOSIS — M86151 Other acute osteomyelitis, right femur: Secondary | ICD-10-CM | POA: Diagnosis not present

## 2024-02-28 ENCOUNTER — Telehealth: Payer: Self-pay

## 2024-02-28 DIAGNOSIS — M86151 Other acute osteomyelitis, right femur: Secondary | ICD-10-CM | POA: Diagnosis not present

## 2024-02-28 DIAGNOSIS — M00051 Staphylococcal arthritis, right hip: Secondary | ICD-10-CM | POA: Diagnosis not present

## 2024-02-28 NOTE — Telephone Encounter (Signed)
 Received the following message from Holley Herring, RN with [employer]:  PULL PICC Order needed Received: Nilsa Herring, Sharlet GORMAN Florene Duwaine JONETTA, RN; Veva Motts T, CMA; Chandra Lorenda HERO, RMA; Celestia Lela HERO, CMA Hey you guys. Melanie Li's last day of IVABX is 7/10. Neither blank was marked on the hospital OPAT orders.  The note from her office visit on 6/28 looks like plan is to stop as of 7/10 just need order to confirm. Thanks all.  Pam

## 2024-02-28 NOTE — Telephone Encounter (Signed)
 Per Dr. Dennise, okay to pull PICC after last dose tomorrow. Orders sent to Holley Herring, RN with [employer].   Lamario Mani, BSN, RN

## 2024-02-29 DIAGNOSIS — M00051 Staphylococcal arthritis, right hip: Secondary | ICD-10-CM | POA: Diagnosis not present

## 2024-02-29 DIAGNOSIS — M86151 Other acute osteomyelitis, right femur: Secondary | ICD-10-CM | POA: Diagnosis not present

## 2024-03-01 DIAGNOSIS — M00051 Staphylococcal arthritis, right hip: Secondary | ICD-10-CM | POA: Diagnosis not present

## 2024-03-01 DIAGNOSIS — M86151 Other acute osteomyelitis, right femur: Secondary | ICD-10-CM | POA: Diagnosis not present

## 2024-03-04 DIAGNOSIS — Z96641 Presence of right artificial hip joint: Secondary | ICD-10-CM | POA: Diagnosis not present

## 2024-03-04 DIAGNOSIS — M25651 Stiffness of right hip, not elsewhere classified: Secondary | ICD-10-CM | POA: Diagnosis not present

## 2024-03-04 DIAGNOSIS — Z471 Aftercare following joint replacement surgery: Secondary | ICD-10-CM | POA: Diagnosis not present

## 2024-03-04 DIAGNOSIS — M25551 Pain in right hip: Secondary | ICD-10-CM | POA: Diagnosis not present

## 2024-03-05 DIAGNOSIS — E039 Hypothyroidism, unspecified: Secondary | ICD-10-CM | POA: Diagnosis not present

## 2024-03-12 DIAGNOSIS — E039 Hypothyroidism, unspecified: Secondary | ICD-10-CM | POA: Diagnosis not present

## 2024-03-18 ENCOUNTER — Encounter: Payer: Self-pay | Admitting: Internal Medicine

## 2024-03-18 ENCOUNTER — Ambulatory Visit (INDEPENDENT_AMBULATORY_CARE_PROVIDER_SITE_OTHER): Payer: Self-pay | Admitting: Internal Medicine

## 2024-03-18 ENCOUNTER — Other Ambulatory Visit: Payer: Self-pay

## 2024-03-18 VITALS — BP 121/83 | HR 70 | Temp 98.0°F | Ht 68.0 in | Wt 143.0 lb

## 2024-03-18 DIAGNOSIS — M86251 Subacute osteomyelitis, right femur: Secondary | ICD-10-CM

## 2024-03-18 NOTE — Progress Notes (Signed)
 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   Vertebral osteomyelitis (HCC) 10/23/2023   Acute osteomyelitis involving pelvic region and thigh, right (HCC) 09/28/2023   Osteomyelitis of right femur (HCC) 09/28/2023   MSSA bacteremia 09/25/2023   Left hip pain 09/25/2023   Right hip pain 09/25/2023   Encounter for hepatitis C screening test for low risk patient 09/25/2023   Septic arthritis (HCC) 09/22/2023   Pain 09/22/2023   SVT (supraventricular tachycardia) (HCC) 02/10/2022   Atrial fibrillation (HCC) 02/10/2022   Tachycardia 02/10/2022   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 ith history of hypothyroidism, who had a corticosteroid injection for hip pain then developed MSSA bacteremia with right septic requiring Girdlestone and spacer placement on 09/27/2023 discharged on cefazolin  x 6 weeks, also found to have vertebral osteomyelitis as such antibiotics extended till 4/1 presents for hospital follow-up right hip arthroplasty with concern for remnant infection.  Following completion of antibiotics patient had lab test done on 4/21 with ESR and CRP being normal.  Underwent ight hip aspiration 4/23 with 305 WBC, 16.5% neutrophils and negative cultures..  She did well off of antibiotics.  She underwent antibiotic spacer resection and conversion to total hip arthroplasty on 5/29. er OR note there is seems to be pretty significant debridement although no purulence noted.. Acetabulum membrane with Gram stain positive for GPC  although cultures are negative.  Unclear if the GPC's contaminant versus representative of infection.  Given her recent MSSA bacteremia and hip infection we will treat as true infection.  Discharged on cefazolin  x 6 weeks EOT 7/10.   02/15/24: She reports that she is tolerating antibiotics no missed doses.  She has some concerns about the patient which have now resolved.  Today 03/18/24.: PICC line was replaced on 6/27  Review of Systems  All other systems reviewed and are negative.   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     Cyanocobalamin (VITAMIN B 12 PO) Take 1 tablet by mouth in the morning.     doxycycline  (VIBRA -TABS) 100 MG tablet Take 1 tablet (100 mg total) by mouth 2 (two) times daily. 60 tablet 3   levothyroxine  (SYNTHROID ) 100 MCG tablet Take 100 mcg by mouth daily before breakfast.     lubiprostone  (AMITIZA ) 24 MCG capsule TAKE 1 CAPSULE (24 MCG TOTAL) BY MOUTH 2 (TWO) TIMES DAILY WITH A MEAL. (Patient taking differently: Take 24 mcg by mouth in the morning.) 180 capsule 1   methocarbamol  (ROBAXIN ) 500 MG tablet Take 1 tablet (500 mg total) by mouth every 6 (six) hours as needed for muscle spasms. 30 tablet 0   Multiple Vitamin (MULTIVITAMIN WITH MINERALS) TABS  tablet Take 1 tablet by mouth in the morning.     ondansetron  (ZOFRAN ) 4 MG tablet Take 1 tablet (4 mg total) by mouth every 8 (eight) hours as needed for nausea or vomiting. 30 tablet 0   pantoprazole  (PROTONIX ) 40 MG tablet Take 1 tablet (40 mg total) by mouth daily. 30 tablet 1   Probiotic Product (PROBIOTIC PO) Take 1 capsule by mouth in the morning.     No  facility-administered medications prior to visit.     Allergies  Allergen Reactions   Sulfa Antibiotics Hives, Rash and Other (See Comments)    Rash covered the entire body   Oxycodone  Other (See Comments)    Very INEFFECTIVE   Conjugated Estrogens Rash and Dermatitis    Social History   Tobacco Use   Smoking status: Never   Smokeless tobacco: Never  Vaping Use   Vaping status: Never Used  Substance Use Topics   Alcohol  use: Yes    Comment: socially   Drug use: Never    Family History  Problem Relation Age of Onset   Cancer Mother    Hypertension Mother    Asthma Daughter    Stroke Maternal Grandmother    Cancer Maternal Grandfather    Diabetes Paternal Grandmother    Cancer Paternal Grandfather    Hearing loss Paternal Grandfather     Objective:  There were no vitals filed for this visit. There is no height or weight on file to calculate BMI.  Physical Exam Constitutional:      Appearance: Normal appearance.  HENT:     Head: Normocephalic and atraumatic.     Right Ear: Tympanic membrane normal.     Left Ear: Tympanic membrane normal.     Nose: Nose normal.     Mouth/Throat:     Mouth: Mucous membranes are moist.  Eyes:     Extraocular Movements: Extraocular movements intact.     Conjunctiva/sclera: Conjunctivae normal.     Pupils: Pupils are equal, round, and reactive to light.  Cardiovascular:     Rate and Rhythm: Normal rate and regular rhythm.     Heart sounds: No murmur heard.    No friction rub. No gallop.  Pulmonary:     Effort: Pulmonary effort is normal.     Breath sounds: Normal breath sounds.  Abdominal:     General: Abdomen is flat.     Palpations: Abdomen is soft.  Skin:    General: Skin is warm and dry.  Neurological:     General: No focal deficit present.     Mental Status: She is alert and oriented to person, place, and time.  Psychiatric:        Mood and Affect: Mood normal.     Lab Results: Lab Results  Component Value  Date   WBC 6.1 01/23/2024   HGB 8.5 (L) 01/23/2024   HCT 25.8 (L) 01/23/2024   MCV 91.8 01/23/2024   PLT 216 01/23/2024    Lab Results  Component Value Date   CREATININE 0.75 01/23/2024   BUN 14 01/23/2024   NA 141 01/23/2024   K 3.7 01/23/2024   CL 106 01/23/2024   CO2 27 01/23/2024    Lab Results  Component Value Date   ALT 26 09/22/2023   AST 29 09/22/2023   ALKPHOS 68 09/22/2023   BILITOT 0.9 09/22/2023     Assessment & Plan:  #Right total hip arthroplasty -concern for possible remnant infection #History of MSSA bacteremia, with right septic  hip requiring Girdlestone and spacer placement on 09/27/2023, vertebral osteomyelitis completed 8 weeks of cefazolin  through 4/1 - Patient had labs off of antibiotics in 4/21 which was stable.  Underwent aspiration of right hip with cultures no growth, only 205 WBC. - Taken to the OR for antibiotic spacer resection, showed right hip arthroplasty on 5/29.  Per the OR note there was significant debridement without purulence.  Gram stain from or culture acetabulum on 9 positive for GPC's all the cultures with no growth.  Opted to treat as a remnant infection given recent bacteremia/infection.  Discharged on cefazolin  x 6 weeks. Plan: Completed cefazolin  on 7/10. Then doxy x 3-6 months for suppression eo t /10/10 Labs today on suppresive doxy Follow up in 3 months  #Right foot discoloration -Pt states she noticed purplish color in rle. She states that when she elevates her leg, the discoloration resolves. Could be 2/2 lymphedema in the setting of operative procedure. Pulses intact -F/U with ortho    #medicaiton management #PICC line removed     Loney Stank, MD [facility]   03/18/24  6:15 AM   I have personally spent 45 minutes involved in face-to-face and non-face-to-face activities for this patient on the day of the visit. Professional time spent includes the following  activities: Preparing to see the patient (review of tests), Obtaining and/or reviewing separately obtained history (admission/discharge record), Performing a medically appropriate examination and/or evaluation , Ordering medications/tests/procedures, referring and communicating with other health care professionals, Documenting clinical information in the EMR, Independently interpreting results (not separately reported), Communicating results to the patient/family/caregiver, Counseling and educating the patient/family/caregiver and Care coordination (not separately reported).

## 2024-03-19 ENCOUNTER — Ambulatory Visit (HOSPITAL_COMMUNITY): Admission: RE | Admit: 2024-03-19 | Source: Ambulatory Visit

## 2024-03-19 ENCOUNTER — Other Ambulatory Visit (HOSPITAL_COMMUNITY): Payer: Self-pay | Admitting: Orthopedic Surgery

## 2024-03-19 DIAGNOSIS — M7989 Other specified soft tissue disorders: Secondary | ICD-10-CM | POA: Diagnosis not present

## 2024-03-19 DIAGNOSIS — M545 Low back pain, unspecified: Secondary | ICD-10-CM | POA: Diagnosis not present

## 2024-03-19 LAB — CBC WITH DIFFERENTIAL/PLATELET
Absolute Lymphocytes: 2001 {cells}/uL (ref 850–3900)
Absolute Monocytes: 531 {cells}/uL (ref 200–950)
Basophils Absolute: 73 {cells}/uL (ref 0–200)
Basophils Relative: 1.2 %
Eosinophils Absolute: 61 {cells}/uL (ref 15–500)
Eosinophils Relative: 1 %
HCT: 36.1 % (ref 35.0–45.0)
Hemoglobin: 12.1 g/dL (ref 11.7–15.5)
MCH: 30.8 pg (ref 27.0–33.0)
MCHC: 33.5 g/dL (ref 32.0–36.0)
MCV: 91.9 fL (ref 80.0–100.0)
MPV: 9.5 fL (ref 7.5–12.5)
Monocytes Relative: 8.7 %
Neutro Abs: 3434 {cells}/uL (ref 1500–7800)
Neutrophils Relative %: 56.3 %
Platelets: 278 Thousand/uL (ref 140–400)
RBC: 3.93 Million/uL (ref 3.80–5.10)
RDW: 11.6 % (ref 11.0–15.0)
Total Lymphocyte: 32.8 %
WBC: 6.1 Thousand/uL (ref 3.8–10.8)

## 2024-03-19 LAB — COMPLETE METABOLIC PANEL WITHOUT GFR
AG Ratio: 2 (calc) (ref 1.0–2.5)
ALT: 9 U/L (ref 6–29)
AST: 17 U/L (ref 10–35)
Albumin: 4.7 g/dL (ref 3.6–5.1)
Alkaline phosphatase (APISO): 78 U/L (ref 37–153)
BUN: 20 mg/dL (ref 7–25)
CO2: 29 mmol/L (ref 20–32)
Calcium: 10.3 mg/dL (ref 8.6–10.4)
Chloride: 102 mmol/L (ref 98–110)
Creat: 0.84 mg/dL (ref 0.50–1.05)
Globulin: 2.3 g/dL (ref 1.9–3.7)
Glucose, Bld: 86 mg/dL (ref 65–99)
Potassium: 4.5 mmol/L (ref 3.5–5.3)
Sodium: 140 mmol/L (ref 135–146)
Total Bilirubin: 0.4 mg/dL (ref 0.2–1.2)
Total Protein: 7 g/dL (ref 6.1–8.1)

## 2024-03-19 LAB — SEDIMENTATION RATE: Sed Rate: 9 mm/h (ref 0–30)

## 2024-03-19 LAB — C-REACTIVE PROTEIN: CRP: <3 mg/L (ref <8.0)

## 2024-03-20 ENCOUNTER — Other Ambulatory Visit (HOSPITAL_COMMUNITY): Payer: Self-pay

## 2024-03-20 ENCOUNTER — Ambulatory Visit (HOSPITAL_COMMUNITY)
Admission: RE | Admit: 2024-03-20 | Discharge: 2024-03-20 | Disposition: A | Discharge status: Home / Self Care | Source: Ambulatory Visit | Attending: Orthopedic Surgery | Admitting: Orthopedic Surgery

## 2024-03-20 ENCOUNTER — Ambulatory Visit (INDEPENDENT_AMBULATORY_CARE_PROVIDER_SITE_OTHER): Admitting: Pharmacist

## 2024-03-20 VITALS — BP 127/85 | HR 63

## 2024-03-20 DIAGNOSIS — I82451 Acute embolism and thrombosis of right peroneal vein: Secondary | ICD-10-CM | POA: Insufficient documentation

## 2024-03-20 DIAGNOSIS — M25651 Stiffness of right hip, not elsewhere classified: Secondary | ICD-10-CM | POA: Diagnosis not present

## 2024-03-20 DIAGNOSIS — M7989 Other specified soft tissue disorders: Secondary | ICD-10-CM | POA: Diagnosis not present

## 2024-03-20 DIAGNOSIS — M25551 Pain in right hip: Secondary | ICD-10-CM | POA: Diagnosis not present

## 2024-03-20 MED ORDER — APIXABAN (ELIQUIS) VTE STARTER PACK (10MG AND 5MG)
ORAL_TABLET | ORAL | 0 refills | Status: DC
  Filled 2024-03-20: qty 74, 30d supply, fill #0

## 2024-03-20 MED ORDER — APIXABAN 5 MG PO TABS: 5.0000 mg | ORAL_TABLET | Freq: Two times a day (BID) | ORAL | 1 refills | Status: DC

## 2024-03-20 NOTE — Progress Notes (Signed)
 DVT Clinic Note  Name: Melanie Li     MRN: [id number]     DOB: 05-Apr-1966     Sex: female  PCP: Wendolyn Jenkins Jansky, MD  Today's Visit: Visit Information: Initial Visit  Referred to DVT Clinic by: Orthopedic Surgery - Dr. Donaciano Sprang Referred to CPP by: Dr. Magda Reason for referral:  Chief Complaint  Patient presents with   DVT   HISTORY OF PRESENT ILLNESS: Melanie Li is a 58 y.o. female with PMH hypothyroidism, SVT, MSSA bacteremia, who presents after diagnosis of DVT for medication management. Patient received an intraarticular steroid injection for right hip pain after an injury in December 2024. She then developed right hip MSSA septic arthritis and was admitted to [facility] from 09/22/23 to 10/02/23. Underwent right hip Girdlestone procedure, placement of articulating antibiotic spacer and was discharged home on IV antibiotics. At follow up, was also found to have vertebral osteomyelitis and IV antibiotics were extended. On 01/18/24, she underwent resection of the right hip antibiotic spacer and conversion to right total hip arthroplasty. Given evidence of remnant infection, she was started on 6 weeks of IV antibiotics followed by doxycycline . She saw orthopedic surgery yesterday and was scheduled for a vascular ultrasound due to intermittent right leg swelling and discoloration. Ultrasound completed 03/20/24 showed acute DVT in the right peroneal vein and she was referred to DVT Clinic to start treatment. Patient denies right leg pain, but has noticed that her right calf and foot has been slightly swollen with a reddish purple tint since her hip replacement. After her hip replacement she says she was immobile for 6 weeks. In the past 2 weeks, she started participating in physical therapy and moving around with a walker or crutches. Denies history of DVT.  Positive Thrombotic Risk Factors: Recent surgery (within 3 months), Recent admission to hospital with acute illness (within  3 months), Bed rest >72 hours within 3 month Bleeding Risk Factors: None Present  Negative Thrombotic Risk Factors: Previous VTE, Recent trauma (within 3 months), Paralysis, paresis, or recent plaster cast immobilization of lower extremity, Central venous catheterization, Pregnancy, Sedentary journey lasting >8 hours within 4 weeks, Within 6 weeks postpartum, Recent cesarean section (within 3 months), Estrogen therapy, Recent COVID diagnosis (within 3 months), Testosterone therapy, Erythropoiesis-stimulating agent, Active cancer, Non-malignant, chronic inflammatory condition, Known thrombophilic condition, Smoking, Obesity, Older age  Rx Insurance Coverage: Commercial Rx Affordability: Eliquis  is ~$630 per 1 month supply with her insurance. Provided patient with copay card to reduce cost to $10 per month. Rx Assistance Provided: Free 30-day trial card Co-pay card Preferred Pharmacy: Eliquis  starter pack filled at [facility] during appointment using 1 month free card. [facility] [address]  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/2023   Hypothyroidism    Pneumonia    SVT (supraventricular tachycardia) (HCC)    Tachycardia    Thyroid  disease    Vertebral osteomyelitis (HCC) 10/23/2023    Past Surgical History:  Procedure Laterality Date   ANTERIOR HIP REVISION Right 01/18/2024   Procedure: REVISION, TOTAL ARTHROPLASTY, HIP, ANTERIOR APPROACH;  Surgeon: Fidel Rogue, MD;  Location: [facility];  Service: Orthopedics;  Laterality: Right;   BUNIONECTOMY Left 2017   also had R bunionectomy in 1984 approx   TOTAL HIP ARTHROPLASTY Right 09/27/2023   Procedure: DEBRIDEMENT AND PLACEMENT OF ARTICULATING ANTIBIOTIC SPACER;  Surgeon: Fidel Rogue, MD;  Location: [facility];  Service: Orthopedics;  Laterality:  Right;   VULVA SURGERY  2019    Social History   Socioeconomic History   Marital status: Married    Spouse  name: Not on file   Number of children: 1   Years of education: Not on file   Highest education level: Some college, no degree  Occupational History   Not on file  Tobacco Use   Smoking status: Never   Smokeless tobacco: Never  Vaping Use   Vaping status: Never Used  Substance and Sexual Activity   Alcohol  use: Yes    Comment: socially   Drug use: Never   Sexual activity: Yes  Other Topics Concern   Not on file  Social History Narrative   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[employer]   Social Drivers of Health   Financial Resource Strain: Low Risk  (10/17/2023)   Overall Financial Resource Strain (CARDIA)    Difficulty of Paying Living Expenses: Not hard at all  Food Insecurity: No Food Insecurity (01/24/2024)   Hunger Vital Sign    Worried About Running Out of Food in the Last Year: Never true    Ran Out of Food in the Last Year: Never true  Transportation Needs: No Transportation Needs (01/24/2024)   PRAPARE - Administrator, Civil Service (Medical): No    Lack of Transportation (Non-Medical): No  Physical Activity: Unknown (10/17/2023)   Exercise Vital Sign    Days of Exercise per Week: Patient declined    Minutes of Exercise per Session: Not on file  Stress: No Stress Concern Present (10/17/2023)   [employer] of Occupational Health - Occupational Stress Questionnaire    Feeling of Stress : Only a little  Social Connections: Moderately Isolated (01/18/2024)   Social Connection and Isolation Panel    Frequency of Communication with Friends and Family: More than three times a week    Frequency of Social Gatherings with Friends and Family: Twice a week    Attends Religious Services: Never    Database administrator or Organizations: No    Attends Engineer, structural: Not on file    Marital Status: Married  Catering manager Violence: Not At Risk (01/24/2024)   Humiliation, Afraid, Rape, and Kick questionnaire    Fear of Current or Ex-Partner: No     Emotionally Abused: No    Physically Abused: No    Sexually Abused: No    Family History  Problem Relation Age of Onset   Cancer Mother    Hypertension Mother    Asthma Daughter    Stroke Maternal Grandmother    Cancer Maternal Grandfather    Diabetes Paternal Grandmother    Cancer Paternal Grandfather    Hearing loss Paternal Grandfather     Allergies as of 03/20/2024 - Review Complete 03/20/2024  Allergen Reaction Noted   Sulfa antibiotics Hives, Rash, and Other (See Comments) 01/26/2022   Oxycodone  Other (See Comments) 09/22/2023   Conjugated estrogens Rash and Dermatitis 01/26/2022    Current Outpatient Medications on File Prior to Visit  Medication Sig Dispense Refill   acetaminophen  (TYLENOL ) 500 MG tablet Take 500 mg by mouth every 6 (six) hours as needed.     Cholecalciferol (VITAMIN D-3 PO) Take 1 tablet by mouth daily.     Cyanocobalamin (VITAMIN B 12 PO) Take 1 tablet by mouth in the morning.     doxycycline  (VIBRA -TABS) 100 MG tablet Take 1 tablet (100 mg total) by mouth 2 (two) times daily. 60 tablet 3   levothyroxine  (  SYNTHROID ) 100 MCG tablet Take 100 mcg by mouth daily before breakfast.     Multiple Vitamin (MULTIVITAMIN WITH MINERALS) TABS tablet Take 1 tablet by mouth in the morning.     Probiotic Product (PROBIOTIC PO) Take 1 capsule by mouth in the morning.     No current facility-administered medications on file prior to visit.   REVIEW OF SYSTEMS:  Review of Systems  Respiratory:  Negative for shortness of breath.   Cardiovascular:  Positive for leg swelling. Negative for chest pain.  Musculoskeletal:  Negative for myalgias.  Neurological:  Negative for tingling.   PHYSICAL EXAMINATION:  Vitals:   03/20/24 1132  BP: 127/85  Pulse: 63  SpO2: 100%    There is no height or weight on file to calculate BMI.  Physical Exam Pulmonary:     Effort: Pulmonary effort is normal.  Musculoskeletal:        General: No tenderness.     Right lower leg: Edema  (mild, non-pitting, primarily around ankle) present.  Neurological:     Mental Status: She is alert.  Psychiatric:        Mood and Affect: Mood normal.        Behavior: Behavior normal.    Villalta Score for Post-Thrombotic Syndrome: Pain: Absent Cramps: Absent Heaviness: Absent Paresthesia: Absent Pruritus: Absent Pretibial Edema: Mild Skin Induration: Absent Hyperpigmentation: Absent Redness: Mild Venous Ectasia: Absent Pain on calf compression: Absent Villalta Preliminary Score: 2 Is venous ulcer present?: No If venous ulcer is present and score is <15, then 15 points total are assigned: Absent Villalta Total Score: 2  LABS:  CBC     Component Value Date/Time   WBC 6.1 03/18/2024 1521   RBC 3.93 03/18/2024 1521   HGB 12.1 03/18/2024 1521   HCT 36.1 03/18/2024 1521   PLT 278 03/18/2024 1521   MCV 91.9 03/18/2024 1521   MCH 30.8 03/18/2024 1521   MCHC 33.5 03/18/2024 1521   RDW 11.6 03/18/2024 1521   LYMPHSABS 1.7 09/22/2023 1326   MONOABS 1.1 (H) 09/22/2023 1326   EOSABS 61 03/18/2024 1521   BASOSABS 73 03/18/2024 1521    Hepatic Function      Component Value Date/Time   PROT 7.0 03/18/2024 1521   ALBUMIN  2.6 (L) 09/22/2023 1326   AST 17 03/18/2024 1521   ALT 9 03/18/2024 1521   ALKPHOS 68 09/22/2023 1326   BILITOT 0.4 03/18/2024 1521    Renal Function   Lab Results  Component Value Date   CREATININE 0.84 03/18/2024   CREATININE 0.75 01/23/2024   CREATININE 0.62 01/20/2024    Estimated Creatinine Clearance: 71.8 mL/min (by C-G formula based on SCr of 0.84 mg/dL).   [facility] Vascular Lab Studies:  03/20/24 VAS US  LOWER EXTREMITY VENOUS (DVT) RIGHT: Summary:  RIGHT:  - Findings consistent with acute deep vein thrombosis involving the right  peroneal veins.  Findings consistent with acute intramuscular thrombosis involving the  right gastrocnemius veins.   - No cystic structure found in the popliteal fossa.    LEFT:  - No evidence of common femoral  vein obstruction.   ASSESSMENT: Location of DVT: Right distal vein Cause of DVT: provoked by a transient risk factor  Patient without prior history of DVT diagnosed with acute DVT in the right peroneal vein. DVT risk factors include recent right hip replacement surgery and prolonged immobilization. Appropriate to start on treatment with Eliquis . Will plan to treat for 3 months for first provoked DVT. Follow up scheduled in 3 months  to ensure no complications contributing to further immobility and will reassess duration of treatment at that time. Extensively counseled on Eliquis  and all patient questions were answered. Counseled to avoid all NSAIDs. Encouraged her to elevate the leg and use compression stockings to help with swelling which she said she has at home. [facility] to reduce cost of refills to $10 per month. No other medication adherence barriers identified.   PLAN: -Start apixaban  (Eliquis ) 10 mg twice daily for 7 days followed by 5 mg twice daily. -Expected duration of therapy: 3 months. Therapy started on 03/20/2024. -Patient educated on purpose, proper use and potential adverse effects of apixaban  (Eliquis ). -Discussed importance of taking medication around the same time every day. -Advised patient of medications to avoid (NSAIDs, aspirin  doses >100 mg daily). -Educated that Tylenol  (acetaminophen ) is the preferred analgesic to lower the risk of bleeding. -Advised patient to alert all providers of anticoagulation therapy prior to starting a new medication or having a procedure. -Emphasized importance of monitoring for signs and symptoms of bleeding (abnormal bruising, prolonged bleeding, nose bleeds, bleeding from gums, discolored urine, black tarry stools). -Educated patient to present to the ED if emergent signs and symptoms of new thrombosis occur. -Counseled patient to wear compression stockings daily, removing at night.  Follow up: DVT Clinic  in 3 months  Izetta Henry, PharmD [facility] Clinical Pharmacist

## 2024-03-20 NOTE — Patient Instructions (Signed)
-  Start apixaban  (Eliquis ) 10 mg twice daily for 7 days followed by 5 mg twice daily. -[facility] You may need to call the pharmacy to ask them to fill this when you start to run low on your current supply.  -It is important to take your medication around the same time every day.  -Avoid NSAIDs like ibuprofen  (Advil , Motrin ) and naproxen  (Aleve ) as well as aspirin  doses over 100 mg daily. -Tylenol  (acetaminophen ) is the preferred over the counter pain medication to lower the risk of bleeding. -Be sure to alert all of your health care providers that you are taking an anticoagulant prior to starting a new medication or having a procedure. -Monitor for signs and symptoms of bleeding (abnormal bruising, prolonged bleeding, nose bleeds, bleeding from gums, discolored urine, black tarry stools). If you have fallen and hit your head OR if your bleeding is severe or not stopping, seek emergency care.  -Go to the emergency room if emergent signs and symptoms of new clot occur (new or worse swelling and pain in an arm or leg, shortness of breath, chest pain, fast or irregular heartbeats, lightheadedness, dizziness, fainting, coughing up blood) or if you experience a significant color change (pale or blue) in the extremity that has the DVT.  -We recommend you wear compression stockings (20-30 mmHg) as long as you are having swelling or pain. Be sure to purchase the correct size and take them off at night.   If you have any questions or need to reschedule an appointment, please call [phone]. If you are having an emergency, call 911 or present to the nearest emergency room.   What is a DVT?  -Deep vein thrombosis (DVT) is a condition in which a blood clot forms in a vein of the deep venous system which can occur in the lower leg, thigh, pelvis, arm, or neck. This condition is serious and can be life-threatening if the clot travels to the arteries of the lungs and causing a  blockage (pulmonary embolism, PE). A DVT can also damage veins in the leg, which can lead to long-term venous disease, leg pain, swelling, discoloration, and ulcers or sores (post-thrombotic syndrome).  -Treatment may include taking an anticoagulant medication to prevent more clots from forming and the current clot from growing, wearing compression stockings, and/or surgical procedures to remove or dissolve the clot.

## 2024-03-25 ENCOUNTER — Other Ambulatory Visit: Payer: Self-pay | Admitting: Pharmacist

## 2024-03-25 DIAGNOSIS — M25551 Pain in right hip: Secondary | ICD-10-CM | POA: Diagnosis not present

## 2024-03-25 DIAGNOSIS — I82451 Acute embolism and thrombosis of right peroneal vein: Secondary | ICD-10-CM

## 2024-03-25 DIAGNOSIS — M25651 Stiffness of right hip, not elsewhere classified: Secondary | ICD-10-CM | POA: Diagnosis not present

## 2024-03-27 DIAGNOSIS — M25651 Stiffness of right hip, not elsewhere classified: Secondary | ICD-10-CM | POA: Diagnosis not present

## 2024-03-27 DIAGNOSIS — M25551 Pain in right hip: Secondary | ICD-10-CM | POA: Diagnosis not present

## 2024-04-01 DIAGNOSIS — M25551 Pain in right hip: Secondary | ICD-10-CM | POA: Diagnosis not present

## 2024-04-01 DIAGNOSIS — M25651 Stiffness of right hip, not elsewhere classified: Secondary | ICD-10-CM | POA: Diagnosis not present

## 2024-04-01 DIAGNOSIS — M5451 Vertebrogenic low back pain: Secondary | ICD-10-CM | POA: Diagnosis not present

## 2024-04-03 DIAGNOSIS — M25551 Pain in right hip: Secondary | ICD-10-CM | POA: Diagnosis not present

## 2024-04-03 DIAGNOSIS — M25651 Stiffness of right hip, not elsewhere classified: Secondary | ICD-10-CM | POA: Diagnosis not present

## 2024-04-09 DIAGNOSIS — Z471 Aftercare following joint replacement surgery: Secondary | ICD-10-CM | POA: Diagnosis not present

## 2024-04-09 DIAGNOSIS — M25651 Stiffness of right hip, not elsewhere classified: Secondary | ICD-10-CM | POA: Diagnosis not present

## 2024-04-09 DIAGNOSIS — M25551 Pain in right hip: Secondary | ICD-10-CM | POA: Diagnosis not present

## 2024-04-09 DIAGNOSIS — Z96641 Presence of right artificial hip joint: Secondary | ICD-10-CM | POA: Diagnosis not present

## 2024-04-10 DIAGNOSIS — M4646 Discitis, unspecified, lumbar region: Secondary | ICD-10-CM | POA: Diagnosis not present

## 2024-04-11 DIAGNOSIS — M25651 Stiffness of right hip, not elsewhere classified: Secondary | ICD-10-CM | POA: Diagnosis not present

## 2024-04-11 DIAGNOSIS — M25551 Pain in right hip: Secondary | ICD-10-CM | POA: Diagnosis not present

## 2024-04-16 DIAGNOSIS — M25551 Pain in right hip: Secondary | ICD-10-CM | POA: Diagnosis not present

## 2024-04-16 DIAGNOSIS — M25651 Stiffness of right hip, not elsewhere classified: Secondary | ICD-10-CM | POA: Diagnosis not present

## 2024-04-18 DIAGNOSIS — M25651 Stiffness of right hip, not elsewhere classified: Secondary | ICD-10-CM | POA: Diagnosis not present

## 2024-04-18 DIAGNOSIS — M25551 Pain in right hip: Secondary | ICD-10-CM | POA: Diagnosis not present

## 2024-04-23 DIAGNOSIS — M25651 Stiffness of right hip, not elsewhere classified: Secondary | ICD-10-CM | POA: Diagnosis not present

## 2024-04-23 DIAGNOSIS — M25551 Pain in right hip: Secondary | ICD-10-CM | POA: Diagnosis not present

## 2024-04-25 DIAGNOSIS — M25551 Pain in right hip: Secondary | ICD-10-CM | POA: Diagnosis not present

## 2024-04-25 DIAGNOSIS — M25651 Stiffness of right hip, not elsewhere classified: Secondary | ICD-10-CM | POA: Diagnosis not present

## 2024-04-30 DIAGNOSIS — M25651 Stiffness of right hip, not elsewhere classified: Secondary | ICD-10-CM | POA: Diagnosis not present

## 2024-04-30 DIAGNOSIS — M25551 Pain in right hip: Secondary | ICD-10-CM | POA: Diagnosis not present

## 2024-05-02 DIAGNOSIS — M25551 Pain in right hip: Secondary | ICD-10-CM | POA: Diagnosis not present

## 2024-05-02 DIAGNOSIS — M25651 Stiffness of right hip, not elsewhere classified: Secondary | ICD-10-CM | POA: Diagnosis not present

## 2024-05-07 DIAGNOSIS — Z124 Encounter for screening for malignant neoplasm of cervix: Secondary | ICD-10-CM | POA: Diagnosis not present

## 2024-05-07 DIAGNOSIS — Z01419 Encounter for gynecological examination (general) (routine) without abnormal findings: Secondary | ICD-10-CM | POA: Diagnosis not present

## 2024-05-07 DIAGNOSIS — Z6823 Body mass index (BMI) 23.0-23.9, adult: Secondary | ICD-10-CM | POA: Diagnosis not present

## 2024-05-07 DIAGNOSIS — Z1231 Encounter for screening mammogram for malignant neoplasm of breast: Secondary | ICD-10-CM | POA: Diagnosis not present

## 2024-05-07 DIAGNOSIS — Z1151 Encounter for screening for human papillomavirus (HPV): Secondary | ICD-10-CM | POA: Diagnosis not present

## 2024-05-08 DIAGNOSIS — M25551 Pain in right hip: Secondary | ICD-10-CM | POA: Diagnosis not present

## 2024-05-08 DIAGNOSIS — M25651 Stiffness of right hip, not elsewhere classified: Secondary | ICD-10-CM | POA: Diagnosis not present

## 2024-05-15 DIAGNOSIS — M25551 Pain in right hip: Secondary | ICD-10-CM | POA: Diagnosis not present

## 2024-05-15 DIAGNOSIS — M25651 Stiffness of right hip, not elsewhere classified: Secondary | ICD-10-CM | POA: Diagnosis not present

## 2024-05-21 DIAGNOSIS — M25551 Pain in right hip: Secondary | ICD-10-CM | POA: Diagnosis not present

## 2024-05-21 DIAGNOSIS — M25651 Stiffness of right hip, not elsewhere classified: Secondary | ICD-10-CM | POA: Diagnosis not present

## 2024-06-03 DIAGNOSIS — M25551 Pain in right hip: Secondary | ICD-10-CM | POA: Diagnosis not present

## 2024-06-03 DIAGNOSIS — M25651 Stiffness of right hip, not elsewhere classified: Secondary | ICD-10-CM | POA: Diagnosis not present

## 2024-06-16 ENCOUNTER — Other Ambulatory Visit: Payer: Self-pay | Admitting: Internal Medicine

## 2024-06-17 ENCOUNTER — Ambulatory Visit: Admitting: Internal Medicine

## 2024-06-17 ENCOUNTER — Other Ambulatory Visit: Payer: Self-pay

## 2024-06-17 VITALS — Wt 159.4 lb

## 2024-06-17 DIAGNOSIS — M4620 Osteomyelitis of vertebra, site unspecified: Secondary | ICD-10-CM | POA: Diagnosis not present

## 2024-06-17 DIAGNOSIS — Z8619 Personal history of other infectious and parasitic diseases: Secondary | ICD-10-CM

## 2024-06-17 DIAGNOSIS — M86251 Subacute osteomyelitis, right femur: Secondary | ICD-10-CM

## 2024-06-17 DIAGNOSIS — Z96641 Presence of right artificial hip joint: Secondary | ICD-10-CM

## 2024-06-17 NOTE — Progress Notes (Signed)
 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   Staphylococcal arthritis of right hip (HCC) 01/18/2024   S/P total right hip arthroplasty 01/18/2024   Diskitis 10/23/2023   Vertebral osteomyelitis (HCC) 10/23/2023   Acute osteomyelitis involving pelvic region and thigh, right (HCC) 09/28/2023   Osteomyelitis of right femur (HCC) 09/28/2023   MSSA bacteremia 09/25/2023   Left hip pain 09/25/2023   Right hip pain 09/25/2023   Encounter for hepatitis C screening test for low risk patient 09/25/2023   Septic arthritis (HCC) 09/22/2023   Pain 09/22/2023   SVT (supraventricular tachycardia) 02/10/2022   Atrial fibrillation (HCC) 02/10/2022   Tachycardia 02/10/2022   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 F with history of hypothyroidism, who had a corticosteroid injection for hip pain then developed MSSA bacteremia with right septic requiring Girdlestone and spacer placement on 09/27/2023 discharged on cefazolin  x 6 weeks, also found to have vertebral osteomyelitis as such antibiotics extended till 4/1 presents for hospital follow-up right hip arthroplasty with concern for remnant infection.  Following completion of antibiotics patient had lab test done on 4/21 with ESR and CRP being normal.  Underwent ight hip aspiration 4/23 with 305 WBC, 16.5% neutrophils and negative cultures..  She did well off of antibiotics.  She underwent antibiotic spacer resection and conversion to total hip arthroplasty on 5/29. er OR note there is seems to be pretty  significant debridement although no purulence noted.. Acetabulum membrane with Gram stain positive for GPC although cultures are negative.  Unclear if the GPC's contaminant versus representative of infection.  Given her recent MSSA bacteremia and hip infection we will treat as true infection.  Discharged on cefazolin  x 6 weeks EOT 7/10.   02/15/24: She reports that she is tolerating antibiotics no missed doses.  She has some concerns about the patient which have now resolved.  03/18/24.: PICC line was replaced on 6/27 Today : Tolerating doxy. No missed doses. No issue Review of Systems  All other systems reviewed and are negative.   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 On day 8, switch to one-5mg  tablet twice daily. 74 each 0   Cholecalciferol (VITAMIN D-3 PO) Take 1 tablet by mouth daily.     Cyanocobalamin (VITAMIN B 12 PO) Take 1 tablet by mouth in the morning.     doxycycline  (VIBRA -TABS) 100 MG tablet Take 1 tablet (100 mg total) by mouth 2 (two) times daily. 60 tablet 3   levothyroxine  (SYNTHROID ) 100 MCG tablet Take 100 mcg by mouth daily before breakfast.     Multiple Vitamin (MULTIVITAMIN  WITH MINERALS) TABS tablet Take 1 tablet by mouth in the morning.     Probiotic Product (PROBIOTIC PO) Take 1 capsule by mouth in the morning.     acetaminophen  (TYLENOL ) 500 MG tablet Take 500 mg by mouth every 6 (six) hours as needed.     apixaban  (ELIQUIS ) 5 MG TABS tablet Take 1 tablet (5 mg total) by mouth 2 (two) times daily. Start after completion of starter pack. 60 tablet 1   No  facility-administered medications prior to visit.     Allergies  Allergen Reactions   Sulfa Antibiotics Hives, Rash and Other (See Comments)    Rash covered the entire body   Oxycodone  Other (See Comments)    Very INEFFECTIVE   Conjugated Estrogens Rash and Dermatitis    Social History   Tobacco Use   Smoking status: Never   Smokeless tobacco: Never  Vaping Use   Vaping status: Never Used  Substance Use Topics   Alcohol  use: Yes    Comment: socially   Drug use: Never    Family History  Problem Relation Age of Onset   Cancer Mother    Hypertension Mother    Asthma Daughter    Stroke Maternal Grandmother    Cancer Maternal Grandfather    Diabetes Paternal Grandmother    Cancer Paternal Grandfather    Hearing loss Paternal Grandfather     Objective:   Vitals:   06/17/24 1045  Weight: 159 lb 6.4 oz (72.3 kg)   Body mass index is 24.24 kg/m.  Physical Exam Constitutional:      Appearance: Normal appearance.  HENT:     Head: Normocephalic and atraumatic.     Right Ear: Tympanic membrane normal.     Left Ear: Tympanic membrane normal.     Nose: Nose normal.     Mouth/Throat:     Mouth: Mucous membranes are moist.  Eyes:     Extraocular Movements: Extraocular movements intact.     Conjunctiva/sclera: Conjunctivae normal.     Pupils: Pupils are equal, round, and reactive to light.  Cardiovascular:     Rate and Rhythm: Normal rate and regular rhythm.     Heart sounds: No murmur heard.    No friction rub. No gallop.  Pulmonary:     Effort: Pulmonary effort is normal.     Breath sounds: Normal breath sounds.  Abdominal:     General: Abdomen is flat.     Palpations: Abdomen is soft.  Skin:    General: Skin is warm and dry.  Neurological:     General: No focal deficit present.     Mental Status: She is alert and oriented to person, place, and time.  Psychiatric:        Mood and Affect: Mood normal.     Lab Results: Lab Results  Component Value  Date   WBC 6.1 03/18/2024   HGB 12.1 03/18/2024   HCT 36.1 03/18/2024   MCV 91.9 03/18/2024   PLT 278 03/18/2024    Lab Results  Component Value Date   CREATININE 0.84 03/18/2024   BUN 20 03/18/2024   NA 140 03/18/2024   K 4.5 03/18/2024   CL 102 03/18/2024   CO2 29 03/18/2024    Lab Results  Component Value Date   ALT 9 03/18/2024   AST 17 03/18/2024   ALKPHOS 68 09/22/2023   BILITOT 0.4 03/18/2024     Assessment & Plan:   #Right total hip arthroplasty -concern for possible remnant infection #  History of MSSA bacteremia, with right septic hip requiring Girdlestone and spacer placement on 09/27/2023, vertebral osteomyelitis completed 8 weeks of cefazolin  through 4/1 - Patient had labs off of antibiotics in 4/21 which was stable.  Underwent aspiration of right hip with cultures no growth, only 205 WBC. - Taken to the OR for antibiotic spacer resection, showed right hip arthroplasty on 5/29.  Per the OR note there was significant debridement without purulence.  Gram stain from or culture acetabulum on 9 positive for GPC's all the cultures with no growth.  Opted to treat as a remnant infection given recent bacteremia/infection.  Discharged on cefazolin  x 6 weeks. Released fom PT.  Hip is dong ok. Some tightmess but over all stable Seen by ortho in th interim. Doxy extended.  Plan: Completed cefazolin  on 7/10. Then doxy x 6 months for suppression eot 08/31/24 Labs today on suppresive doxy. 7/28 labs stacle Follow up in 3 months at end of therapy  06/17/24  11:02 AM I have personally spent 42 minutes involved in face-to-face and non-face-to-face activities for this patient on the day of the visit. Professional time spent includes the following activities: Preparing to see the patient (review of tests), Obtaining and/or reviewing separately obtained history (admission/discharge record), Performing a medically appropriate examination and/or evaluation , Ordering  medications/tests/procedures, referring and communicating with other health care professionals, Documenting clinical information in the EMR, Independently interpreting results (not separately reported), Communicating results to the patient/family/caregiver, Counseling and educating the patient/family/caregiver and Care coordination (not separately reported).

## 2024-06-17 NOTE — Progress Notes (Unsigned)
 DVT Clinic Note  Name: Melanie Li     MRN: [id number]     DOB: July 20, 1966     Sex: female  PCP: Wendolyn Jenkins Jansky, MD  Today's Visit: Visit Information: Discharge Visit  Referred to DVT Clinic by: Orthopedic Surgery - Dr. Donaciano Sprang Referred to CPP by: Dr. Magda Reason for referral:  Chief Complaint  Patient presents with   DVT   HISTORY OF PRESENT ILLNESS: Melanie Li is a 58 y.o. female with PMH hypothyroidism, SVT afib, MSSA bacteremia, who presents for follow up medication management after diagnosis of DVT on 03/20/24. Patient received an intraarticular steroid injection for right hip pain after an injury in December 2024. She then developed right hip MSSA septic arthritis and was admitted to [facility] from 09/22/23 to 10/02/23. Underwent right hip Girdlestone procedure, placement of articulating antibiotic spacer and was discharged home on IV antibiotics. At follow up, was also found to have vertebral osteomyelitis and IV antibiotics were extended. On 01/18/24, she underwent resection of the right hip antibiotic spacer and conversion to right total hip arthroplasty. Given evidence of remnant infection, she was started on 6 weeks of IV antibiotics followed by doxycycline . She saw orthopedic surgery for follow up and was scheduled for a vascular ultrasound due to intermittent right leg swelling and discoloration. Ultrasound completed 03/20/24 showed acute DVT in the right peroneal vein. Last seen in DVT Clinic 03/20/24 at which time she was started on Eliquis  with plan to treat for 3 months for first provoked DVT. Per Dr. Lanis, planned to repeat ultrasound at 3 month follow up.  Today, patient presents to clinic ambulating independently accompanied by her husband. Denies abnormal bleeding or bruising. Endorses only 1 missed dose of Eliquis  over the past 3 months and says she has 1-2 tablets left at home. Has not been wearing compression stockings, but says she has some at home.  Patient reports she was recently discharged from PT, but is still completing exercises on her own at home. Reports her hip still feels tight, but her mobility has significantly improved over the past 3 months. Denies pain in her right calf, but still endorses swelling and slight discoloration in her toes.  Positive Thrombotic Risk Factors: Recent surgery (within 3 months), Recent admission to hospital with acute illness (within 3 months), Bed rest >72 hours within 3 month Bleeding Risk Factors: None Present  Negative Thrombotic Risk Factors: Previous VTE, Recent trauma (within 3 months), Paralysis, paresis, or recent plaster cast immobilization of lower extremity, Central venous catheterization, Sedentary journey lasting >8 hours within 4 weeks, Pregnancy, Estrogen therapy, Recent cesarean section (within 3 months), Within 6 weeks postpartum, Testosterone therapy, Erythropoiesis-stimulating agent, Recent COVID diagnosis (within 3 months), Known thrombophilic condition, Smoking, Non-malignant, chronic inflammatory condition, Active cancer, Obesity, Older age  Rx Insurance Coverage: Commercial Rx Affordability: Eliquis  is ~$630 per 1 month supply with her insurance. Previously provided patient with copay card to reduce cost to $10 per month.  Rx Assistance Provided: Free 30-day trial card Co-pay card Preferred Pharmacy: Eliquis  starter pack filled at [facility] during initial appointment using 1 month free card. [facility]ills [facility] to [facility] preferred [facility] [address].   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/2023   Hypothyroidism    Pneumonia    SVT (supraventricular tachycardia)    Tachycardia    Thyroid  disease    Vertebral osteomyelitis (HCC) 10/23/2023    Past Surgical History:  Procedure Laterality  Date   ANTERIOR HIP REVISION Right 01/18/2024   Procedure: REVISION, TOTAL ARTHROPLASTY, HIP, ANTERIOR APPROACH;  Surgeon: Fidel Rogue, MD;   Location: [facility];  Service: Orthopedics;  Laterality: Right;   BUNIONECTOMY Left 2017   also had R bunionectomy in 1984 approx   TOTAL HIP ARTHROPLASTY Right 09/27/2023   Procedure: DEBRIDEMENT AND PLACEMENT OF ARTICULATING ANTIBIOTIC SPACER;  Surgeon: Fidel Rogue, MD;  Location: [facility];  Service: Orthopedics;  Laterality: Right;   VULVA SURGERY  2019    Social History   Socioeconomic History   Marital status: Married    Spouse name: Not on file   Number of children: 1   Years of education: Not on file   Highest education level: Some college, no degree  Occupational History   Not on file  Tobacco Use   Smoking status: Never   Smokeless tobacco: Never  Vaping Use   Vaping status: Never Used  Substance and Sexual Activity   Alcohol  use: Yes    Comment: socially   Drug use: Never   Sexual activity: Yes  Other Topics Concern   Not on file  Social History Narrative   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[employer]   Social Drivers of Health   Financial Resource Strain: Low Risk  (10/17/2023)   Overall Financial Resource Strain (CARDIA)    Difficulty of Paying Living Expenses: Not hard at all  Food Insecurity: No Food Insecurity (01/24/2024)   Hunger Vital Sign    Worried About Running Out of Food in the Last Year: Never true    Ran Out of Food in the Last Year: Never true  Transportation Needs: No Transportation Needs (01/24/2024)   PRAPARE - Administrator, Civil Service (Medical): No    Lack of Transportation (Non-Medical): No  Physical Activity: Unknown (10/17/2023)   Exercise Vital Sign    Days of Exercise per Week: Patient declined    Minutes of Exercise per Session: Not on file  Stress: No Stress Concern Present (10/17/2023)   [employer] of Occupational Health - Occupational Stress Questionnaire    Feeling of Stress : Only a little  Social Connections: Moderately Isolated (01/18/2024)   Social Connection and Isolation Panel    Frequency of Communication with  Friends and Family: More than three times a week    Frequency of Social Gatherings with Friends and Family: Twice a week    Attends Religious Services: Never    Database Administrator or Organizations: No    Attends Engineer, Structural: Not on file    Marital Status: Married  Catering Manager Violence: Not At Risk (01/24/2024)   Humiliation, Afraid, Rape, and Kick questionnaire    Fear of Current or Ex-Partner: No    Emotionally Abused: No    Physically Abused: No    Sexually Abused: No    Family History  Problem Relation Age of Onset   Cancer Mother    Hypertension Mother    Asthma Daughter    Stroke Maternal Grandmother    Cancer Maternal Grandfather    Diabetes Paternal Grandmother    Cancer Paternal Grandfather    Hearing loss Paternal Grandfather     Allergies as of 06/18/2024 - Review Complete 06/18/2024  Allergen Reaction Noted   Sulfa antibiotics Hives, Rash, and Other (See Comments) 01/26/2022   Oxycodone  Other (See Comments) 09/22/2023   Conjugated estrogens Rash and Dermatitis 01/26/2022    Current Outpatient Medications on File Prior to Visit  Medication Sig Dispense  Refill   acetaminophen  (TYLENOL ) 500 MG tablet Take 500 mg by mouth every 6 (six) hours as needed.     Cholecalciferol (VITAMIN D-3 PO) Take 1 tablet by mouth daily.     Cyanocobalamin (VITAMIN B 12 PO) Take 1 tablet by mouth in the morning.     doxycycline  (VIBRA -TABS) 100 MG tablet TAKE 1 TABLET BY MOUTH TWICE A DAY 60 tablet 1   levothyroxine  (SYNTHROID ) 100 MCG tablet Take 100 mcg by mouth daily before breakfast.     Multiple Vitamin (MULTIVITAMIN WITH MINERALS) TABS tablet Take 1 tablet by mouth in the morning.     Probiotic Product (PROBIOTIC PO) Take 1 capsule by mouth in the morning.     No current facility-administered medications on file prior to visit.   REVIEW OF SYSTEMS:  Review of Systems  Respiratory:  Negative for shortness of breath.   Cardiovascular:  Positive for leg  swelling. Negative for chest pain.  Musculoskeletal:  Negative for myalgias.   PHYSICAL EXAMINATION:  There were no vitals filed for this visit.  There is no height or weight on file to calculate BMI.  Physical Exam Pulmonary:     Effort: Pulmonary effort is normal.  Musculoskeletal:        General: No tenderness.     Right lower leg: Edema (mild, non-pitting) present.  Neurological:     Mental Status: She is alert.  Psychiatric:        Mood and Affect: Mood normal.    Villalta Score for Post-Thrombotic Syndrome: Pain: Absent Cramps: Absent Heaviness: Absent Paresthesia: Absent Pruritus: Absent Pretibial Edema: Mild Skin Induration: Absent Hyperpigmentation: Absent Redness: Absent Venous Ectasia: Absent Pain on calf compression: Absent Villalta Preliminary Score: 1 Is venous ulcer present?: No If venous ulcer is present and score is <15, then 15 points total are assigned: Absent Villalta Total Score: 1  LABS:  CBC     Component Value Date/Time   WBC 6.1 03/18/2024 1521   RBC 3.93 03/18/2024 1521   HGB 12.1 03/18/2024 1521   HCT 36.1 03/18/2024 1521   PLT 278 03/18/2024 1521   MCV 91.9 03/18/2024 1521   MCH 30.8 03/18/2024 1521   MCHC 33.5 03/18/2024 1521   RDW 11.6 03/18/2024 1521   LYMPHSABS 1.7 09/22/2023 1326   MONOABS 1.1 (H) 09/22/2023 1326   EOSABS 61 03/18/2024 1521   BASOSABS 73 03/18/2024 1521    Hepatic Function      Component Value Date/Time   PROT 7.0 03/18/2024 1521   ALBUMIN  2.6 (L) 09/22/2023 1326   AST 17 03/18/2024 1521   ALT 9 03/18/2024 1521   ALKPHOS 68 09/22/2023 1326   BILITOT 0.4 03/18/2024 1521    Renal Function   Lab Results  Component Value Date   CREATININE 0.84 03/18/2024   CREATININE 0.75 01/23/2024   CREATININE 0.62 01/20/2024    CrCl cannot be calculated (Patient's most recent lab result is older than the maximum 21 days allowed.).   [facility] Vascular Lab Studies:  06/18/24 VAS US  LOWER EXTREMITY VENOUS (DVT)  RIGHT: Summary:  RIGHT:  - No evidence of deep vein thrombosis in the lower extremity. No indirect  evidence of obstruction proximal to the inguinal ligament.    - Findings suggest resolution of previously noted thrombus.  - No cystic structure found in the popliteal fossa.    LEFT:  - No evidence of common femoral vein obstruction.   03/20/24 VAS US  LOWER EXTREMITY VENOUS (DVT) RIGHT: Summary:  RIGHT:  - Findings  consistent with acute deep vein thrombosis involving the right  peroneal veins.  - Findings consistent with acute intramuscular thrombosis involving the  right gastrocnemius veins.   - No cystic structure found in the popliteal fossa.    LEFT:  - No evidence of common femoral vein obstruction.   ASSESSMENT: Location of DVT: Right distal vein Cause of DVT: provoked by a transient risk factor  Patient without prior history of DVT diagnosed with acute DVT in the right peroneal veins. DVT risk factors include right hip replacement surgery and prolonged immobilization. She was started on Eliquis  with plan to treat for 3 months for first provoked DVT with repeat ultrasound at 3 months per Dr. Lanis. Follow up ultrasound shows resolution of DVT in right peroneal veins. Her mobility has significantly improved and she is ambulating independently. Will have her finish her current supply of Eliquis  then stop the medication as she will have completed 3 months of treatment. She has a history of afib, but was not on anticoagulation prior to DVT diagnosis. She said she has not had any afib episodes in a long time and follows with cardiology for this. Encouraged her to reach out to her cardiologist if she begins to experience any afib episodes. Counseled to wear compression stockings to help with swelling which she says she has at home. Counseled to let all future providers know of DVT history and discussed DVT risk reduction strategies. Patient confirmed understanding of plan and all of her  questions were answered.  PLAN: -Patient is discharged from the DVT Clinic. -Discontinue anticoagulation with Eliquis  after finishing her current supply, as patient has completed 3 months of treatment for provoked DVT.  -Counseled patient on future VTE risk reduction strategies and to inform all future providers of DVT history.  Follow up: No further follow up in DVT Clinic needed  Izetta Henry, PharmD [facility] [phone]

## 2024-06-18 ENCOUNTER — Ambulatory Visit (HOSPITAL_COMMUNITY)
Admission: RE | Admit: 2024-06-18 | Discharge: 2024-06-18 | Disposition: A | Discharge status: Home / Self Care | Source: Ambulatory Visit | Attending: Vascular Surgery | Admitting: Vascular Surgery

## 2024-06-18 ENCOUNTER — Ambulatory Visit: Admitting: Pharmacist

## 2024-06-18 DIAGNOSIS — I82451 Acute embolism and thrombosis of right peroneal vein: Secondary | ICD-10-CM | POA: Diagnosis not present

## 2024-06-18 NOTE — Patient Instructions (Signed)
-   Finish your current supply of Eliquis  then stop taking the medication

## 2024-08-21 ENCOUNTER — Other Ambulatory Visit: Payer: Self-pay | Admitting: Internal Medicine

## 2024-09-16 ENCOUNTER — Ambulatory Visit: Payer: Self-pay | Admitting: Internal Medicine

## 2024-09-19 ENCOUNTER — Encounter: Payer: Self-pay | Admitting: Internal Medicine

## 2024-09-19 ENCOUNTER — Other Ambulatory Visit: Payer: Self-pay

## 2024-09-19 ENCOUNTER — Ambulatory Visit: Admitting: Internal Medicine

## 2024-09-19 VITALS — BP 134/80 | HR 60 | Temp 98.0°F | Wt 164.0 lb

## 2024-09-19 DIAGNOSIS — M86151 Other acute osteomyelitis, right femur: Secondary | ICD-10-CM

## 2024-09-19 NOTE — Patient Instructions (Signed)
 Stop doxy Labs today Continue to follow up with orthopedics F/u with ID PRN

## 2024-09-19 NOTE — Progress Notes (Unsigned)
 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   Vertebral osteomyelitis (HCC) 10/23/2023   Acute osteomyelitis involving pelvic region and thigh, right (HCC) 09/28/2023   Osteomyelitis of right femur (HCC) 09/28/2023   MSSA bacteremia 09/25/2023   Left hip pain 09/25/2023   Right hip pain 09/25/2023   Encounter for hepatitis C screening test for low risk patient 09/25/2023   Septic arthritis (HCC) 09/22/2023   Pain 09/22/2023   SVT (supraventricular tachycardia) 02/10/2022   Atrial fibrillation (HCC) 02/10/2022   Tachycardia 02/10/2022   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 @GENDER @ with   ROS  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/2023   Hypothyroidism    Pneumonia    SVT (supraventricular tachycardia)    Tachycardia    Thyroid  disease    Vertebral osteomyelitis (HCC) 10/23/2023    Outpatient Medications Prior to Visit  Medication Sig Dispense Refill   acetaminophen  (TYLENOL ) 500 MG tablet Take 500 mg by mouth every 6 (six) hours as needed.     Cholecalciferol (VITAMIN D-3 PO) Take 1 tablet by mouth daily.     Cyanocobalamin (VITAMIN B 12 PO) Take 1 tablet by mouth in the morning.     doxycycline  (VIBRA -TABS) 100 MG tablet TAKE 1 TABLET BY MOUTH TWICE A DAY 60 tablet 0   levothyroxine  (SYNTHROID ) 100 MCG tablet Take 100 mcg by mouth daily before  breakfast.     Multiple Vitamin (MULTIVITAMIN WITH MINERALS) TABS tablet Take 1 tablet by mouth in the morning.     Probiotic Product (PROBIOTIC PO) Take 1 capsule by mouth in the morning.     No facility-administered medications prior to visit.     Allergies[1]  Social History[2]  Family History  Problem Relation Age of Onset   Cancer Mother    Hypertension Mother    Asthma Daughter    Stroke Maternal Grandmother    Cancer Maternal Grandfather    Diabetes Paternal Grandmother    Cancer Paternal Grandfather    Hearing loss Paternal Grandfather     Objective:   Vitals:   09/19/24 1114  BP: 134/80  Pulse: 60  Temp: 98 F (36.7 C)  TempSrc: Oral  SpO2: 100%  Weight: 164 lb (74.4 kg)   Body mass index is 24.94 kg/m.  Physical Exam  Lab Results: Lab Results  Component Value Date   WBC 6.1 03/18/2024   HGB 12.1 03/18/2024   HCT 36.1 03/18/2024   MCV 91.9 03/18/2024   PLT 278 03/18/2024    Lab Results  Component Value Date   CREATININE 0.84 03/18/2024   BUN 20 03/18/2024   NA 140 03/18/2024   K 4.5 03/18/2024   CL 102 03/18/2024   CO2 29 03/18/2024    Lab Results  Component Value Date   ALT 9 03/18/2024   AST 17  03/18/2024   ALKPHOS 68 09/22/2023   BILITOT 0.4 03/18/2024     Assessment & Plan:   - PT staes hip feeels better -Stop doxy - cbc,,cmp esr crp   Loney Stank, MD [facility]   09/19/24  11:27 AM     [1]  Allergies Allergen Reactions   Sulfa Antibiotics Hives, Rash and Other (See Comments)    Rash covered the entire body   Oxycodone  Other (See Comments)    Very INEFFECTIVE   Conjugated Estrogens Rash and Dermatitis  [2]  Social History Tobacco Use   Smoking status: Never   Smokeless tobacco: Never  Vaping Use   Vaping status: Never Used  Substance Use Topics   Alcohol  use: Yes    Comment: socially   Drug use: Never   "

## 2024-09-20 LAB — C-REACTIVE PROTEIN: CRP: <3 mg/L

## 2024-09-20 LAB — COMPLETE METABOLIC PANEL WITHOUT GFR
AG Ratio: 2 (calc) (ref 1.0–2.5)
ALT: 13 U/L (ref 6–29)
AST: 17 U/L (ref 10–35)
Albumin: 4.7 g/dL (ref 3.6–5.1)
Alkaline phosphatase (APISO): 83 U/L (ref 37–153)
BUN: 15 mg/dL (ref 7–25)
CO2: 29 mmol/L (ref 20–32)
Calcium: 9.6 mg/dL (ref 8.6–10.4)
Chloride: 105 mmol/L (ref 98–110)
Creat: 0.88 mg/dL (ref 0.50–1.05)
Globulin: 2.3 g/dL (ref 1.9–3.7)
Glucose, Bld: 86 mg/dL (ref 65–99)
Potassium: 4.3 mmol/L (ref 3.5–5.3)
Sodium: 141 mmol/L (ref 135–146)
Total Bilirubin: 0.7 mg/dL (ref 0.2–1.2)
Total Protein: 7 g/dL (ref 6.1–8.1)

## 2024-09-20 LAB — CBC WITH DIFFERENTIAL/PLATELET
Absolute Lymphocytes: 1730 {cells}/uL (ref 850–3900)
Absolute Monocytes: 442 {cells}/uL (ref 200–950)
Basophils Absolute: 62 {cells}/uL (ref 0–200)
Basophils Relative: 1.1 %
Eosinophils Absolute: 39 {cells}/uL (ref 15–500)
Eosinophils Relative: 0.7 %
HCT: 38.9 % (ref 35.9–46.0)
Hemoglobin: 13.3 g/dL (ref 11.7–15.5)
MCH: 31.1 pg (ref 27.0–33.0)
MCHC: 34.2 g/dL (ref 31.6–35.4)
MCV: 90.9 fL (ref 81.4–101.7)
MPV: 9.7 fL (ref 7.5–12.5)
Monocytes Relative: 7.9 %
Neutro Abs: 3326 {cells}/uL (ref 1500–7800)
Neutrophils Relative %: 59.4 %
Platelets: 286 10*3/uL (ref 140–400)
RBC: 4.28 Million/uL (ref 3.80–5.10)
RDW: 12.2 % (ref 11.0–15.0)
Total Lymphocyte: 30.9 %
WBC: 5.6 10*3/uL (ref 3.8–10.8)

## 2024-09-20 LAB — SEDIMENTATION RATE: Sed Rate: 2 mm/h (ref 0–30)

## 2024-09-22 ENCOUNTER — Other Ambulatory Visit: Payer: Self-pay | Admitting: Internal Medicine
